# Patient Record
Sex: Female | Born: 1965
Health system: Southern US, Community
[De-identification: ages and names within clinical notes are randomized; demographics above are authoritative.]

## PROBLEM LIST (undated history)

## (undated) DIAGNOSIS — I10 Essential (primary) hypertension: Secondary | ICD-10-CM

## (undated) DIAGNOSIS — R161 Splenomegaly, not elsewhere classified: Secondary | ICD-10-CM

## (undated) DIAGNOSIS — E039 Hypothyroidism, unspecified: Secondary | ICD-10-CM

## (undated) DIAGNOSIS — R079 Chest pain, unspecified: Secondary | ICD-10-CM

## (undated) HISTORY — DX: Chest pain, unspecified: R07.9

## (undated) HISTORY — DX: Hypothyroidism, unspecified: E03.9

## (undated) HISTORY — DX: Essential (primary) hypertension: I10

## (undated) HISTORY — PX: LYMPH NODE DISSECTION: SHX5087

---
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
## 1997-06-15 ENCOUNTER — Inpatient Hospital Stay (HOSPITAL_COMMUNITY): Admission: AD | Admit: 1997-06-15 | Discharge: 1997-06-17 | Payer: Self-pay | Admitting: Gynecology

## 1998-11-05 ENCOUNTER — Other Ambulatory Visit: Admission: RE | Admit: 1998-11-05 | Discharge: 1998-11-05 | Payer: Self-pay | Admitting: Obstetrics and Gynecology

## 1999-06-03 ENCOUNTER — Ambulatory Visit (HOSPITAL_COMMUNITY): Admission: RE | Admit: 1999-06-03 | Discharge: 1999-06-03 | Payer: Self-pay | Admitting: *Deleted

## 1999-11-08 ENCOUNTER — Other Ambulatory Visit: Admission: RE | Admit: 1999-11-08 | Discharge: 1999-11-08 | Payer: Self-pay | Admitting: Gynecology

## 2000-11-16 ENCOUNTER — Other Ambulatory Visit: Admission: RE | Admit: 2000-11-16 | Discharge: 2000-11-16 | Payer: Self-pay | Admitting: Gynecology

## 2001-06-29 ENCOUNTER — Emergency Department (HOSPITAL_COMMUNITY): Admission: EM | Admit: 2001-06-29 | Discharge: 2001-06-29 | Payer: Self-pay | Admitting: Emergency Medicine

## 2001-11-04 ENCOUNTER — Encounter: Payer: Self-pay | Admitting: Internal Medicine

## 2001-11-04 ENCOUNTER — Ambulatory Visit (HOSPITAL_COMMUNITY): Admission: RE | Admit: 2001-11-04 | Discharge: 2001-11-04 | Payer: Self-pay | Admitting: Internal Medicine

## 2001-12-20 ENCOUNTER — Other Ambulatory Visit: Admission: RE | Admit: 2001-12-20 | Discharge: 2001-12-20 | Payer: Self-pay | Admitting: Gynecology

## 2002-12-26 ENCOUNTER — Other Ambulatory Visit: Admission: RE | Admit: 2002-12-26 | Discharge: 2002-12-26 | Payer: Self-pay | Admitting: Gynecology

## 2003-11-11 ENCOUNTER — Ambulatory Visit (HOSPITAL_COMMUNITY): Admission: RE | Admit: 2003-11-11 | Discharge: 2003-11-11 | Payer: Self-pay | Admitting: Internal Medicine

## 2004-02-09 ENCOUNTER — Other Ambulatory Visit: Admission: RE | Admit: 2004-02-09 | Discharge: 2004-02-09 | Payer: Self-pay | Admitting: Gynecology

## 2004-03-24 ENCOUNTER — Ambulatory Visit: Payer: Self-pay | Admitting: Oncology

## 2004-06-28 ENCOUNTER — Ambulatory Visit: Payer: Self-pay | Admitting: Oncology

## 2004-09-21 ENCOUNTER — Ambulatory Visit: Payer: Self-pay | Admitting: Oncology

## 2004-09-22 ENCOUNTER — Ambulatory Visit: Payer: Self-pay | Admitting: Oncology

## 2004-11-10 ENCOUNTER — Ambulatory Visit: Payer: Self-pay | Admitting: Oncology

## 2005-02-13 ENCOUNTER — Ambulatory Visit: Payer: Self-pay | Admitting: Oncology

## 2005-02-14 ENCOUNTER — Other Ambulatory Visit: Admission: RE | Admit: 2005-02-14 | Discharge: 2005-02-14 | Payer: Self-pay | Admitting: Gynecology

## 2005-08-14 ENCOUNTER — Ambulatory Visit: Payer: Self-pay | Admitting: Oncology

## 2005-11-27 ENCOUNTER — Ambulatory Visit (HOSPITAL_COMMUNITY): Admission: RE | Admit: 2005-11-27 | Discharge: 2005-11-27 | Payer: Self-pay | Admitting: Internal Medicine

## 2006-02-16 ENCOUNTER — Other Ambulatory Visit: Admission: RE | Admit: 2006-02-16 | Discharge: 2006-02-16 | Payer: Self-pay | Admitting: Gynecology

## 2006-02-20 ENCOUNTER — Ambulatory Visit (HOSPITAL_COMMUNITY): Admission: RE | Admit: 2006-02-20 | Discharge: 2006-02-20 | Payer: Self-pay | Admitting: Gynecology

## 2006-08-09 ENCOUNTER — Ambulatory Visit: Payer: Self-pay | Admitting: Oncology

## 2006-08-28 LAB — CBC WITH DIFFERENTIAL (CANCER CENTER ONLY)
BASO#: 0.1 10*3/uL (ref 0.0–0.2)
BASO%: 0.7 % (ref 0.0–2.0)
EOS%: 2.3 % (ref 0.0–7.0)
Eosinophils Absolute: 0.2 10*3/uL (ref 0.0–0.5)
HCT: 43.5 % (ref 34.8–46.6)
HGB: 15 g/dL (ref 11.6–15.9)
LYMPH#: 2.7 10*3/uL (ref 0.9–3.3)
LYMPH%: 30 % (ref 14.0–48.0)
MCH: 30.5 pg (ref 26.0–34.0)
MCHC: 34.4 g/dL (ref 32.0–36.0)
MCV: 89 fL (ref 81–101)
MONO#: 0.5 10*3/uL (ref 0.1–0.9)
MONO%: 6 % (ref 0.0–13.0)
NEUT#: 5.5 10*3/uL (ref 1.5–6.5)
NEUT%: 61 % (ref 39.6–80.0)
Platelets: 235 10*3/uL (ref 145–400)
RBC: 4.9 10*6/uL (ref 3.70–5.32)
RDW: 12.3 % (ref 10.5–14.6)
WBC: 9.1 10*3/uL (ref 3.9–10.0)

## 2006-08-29 LAB — IRON AND TIBC
%SAT: 22 % (ref 20–55)
Iron: 62 ug/dL (ref 42–145)
TIBC: 281 ug/dL (ref 250–470)
UIBC: 219 ug/dL

## 2006-08-29 LAB — COMPREHENSIVE METABOLIC PANEL
ALT: 20 U/L (ref 0–35)
AST: 19 U/L (ref 0–37)
Albumin: 4.3 g/dL (ref 3.5–5.2)
Alkaline Phosphatase: 53 U/L (ref 39–117)
BUN: 6 mg/dL (ref 6–23)
CO2: 26 mEq/L (ref 19–32)
Calcium: 9.4 mg/dL (ref 8.4–10.5)
Chloride: 103 mEq/L (ref 96–112)
Creatinine, Ser: 0.76 mg/dL (ref 0.40–1.20)
Glucose, Bld: 95 mg/dL (ref 70–99)
Potassium: 3.7 mEq/L (ref 3.5–5.3)
Sodium: 136 mEq/L (ref 135–145)
Total Bilirubin: 0.6 mg/dL (ref 0.3–1.2)
Total Protein: 7.6 g/dL (ref 6.0–8.3)

## 2006-08-29 LAB — RETICULOCYTES (CHCC)
ABS Retic: 65.3 10*3/uL (ref 19.0–186.0)
RBC.: 5.02 MIL/uL (ref 3.87–5.11)
Retic Ct Pct: 1.3 % (ref 0.4–3.1)

## 2006-08-29 LAB — FERRITIN: Ferritin: 131 ng/mL (ref 10–291)

## 2007-02-20 ENCOUNTER — Other Ambulatory Visit: Admission: RE | Admit: 2007-02-20 | Discharge: 2007-02-20 | Payer: Self-pay | Admitting: Gynecology

## 2007-02-22 ENCOUNTER — Ambulatory Visit: Payer: Self-pay | Admitting: Oncology

## 2007-02-26 LAB — CBC WITH DIFFERENTIAL (CANCER CENTER ONLY)
BASO#: 0.1 10*3/uL (ref 0.0–0.2)
BASO%: 0.6 % (ref 0.0–2.0)
EOS%: 1.9 % (ref 0.0–7.0)
Eosinophils Absolute: 0.2 10*3/uL (ref 0.0–0.5)
HCT: 39.8 % (ref 34.8–46.6)
HGB: 13.8 g/dL (ref 11.6–15.9)
LYMPH#: 2.4 10*3/uL (ref 0.9–3.3)
LYMPH%: 28.8 % (ref 14.0–48.0)
MCH: 30.2 pg (ref 26.0–34.0)
MCHC: 34.5 g/dL (ref 32.0–36.0)
MCV: 87 fL (ref 81–101)
MONO#: 0.6 10*3/uL (ref 0.1–0.9)
MONO%: 6.9 % (ref 0.0–13.0)
NEUT#: 5.2 10*3/uL (ref 1.5–6.5)
NEUT%: 61.8 % (ref 39.6–80.0)
Platelets: 270 10*3/uL (ref 145–400)
RBC: 4.56 10*6/uL (ref 3.70–5.32)
RDW: 12.3 % (ref 10.5–14.6)
WBC: 8.4 10*3/uL (ref 3.9–10.0)

## 2007-02-26 LAB — IRON AND TIBC
%SAT: 17 % — ABNORMAL LOW (ref 20–55)
Iron: 49 ug/dL (ref 42–145)
TIBC: 289 ug/dL (ref 250–470)
UIBC: 240 ug/dL

## 2007-02-26 LAB — FERRITIN: Ferritin: 81 ng/mL (ref 10–291)

## 2007-03-05 ENCOUNTER — Ambulatory Visit (HOSPITAL_COMMUNITY): Admission: RE | Admit: 2007-03-05 | Discharge: 2007-03-05 | Payer: Self-pay | Admitting: Gynecology

## 2007-08-20 ENCOUNTER — Ambulatory Visit: Payer: Self-pay | Admitting: Oncology

## 2007-08-26 LAB — CBC WITH DIFFERENTIAL (CANCER CENTER ONLY)
BASO#: 0.1 10*3/uL (ref 0.0–0.2)
BASO%: 0.8 % (ref 0.0–2.0)
EOS%: 1.9 % (ref 0.0–7.0)
Eosinophils Absolute: 0.2 10*3/uL (ref 0.0–0.5)
HCT: 40 % (ref 34.8–46.6)
HGB: 13.7 g/dL (ref 11.6–15.9)
LYMPH#: 2.6 10*3/uL (ref 0.9–3.3)
LYMPH%: 27.8 % (ref 14.0–48.0)
MCH: 28.9 pg (ref 26.0–34.0)
MCHC: 34.3 g/dL (ref 32.0–36.0)
MCV: 84 fL (ref 81–101)
MONO#: 0.5 10*3/uL (ref 0.1–0.9)
MONO%: 5.4 % (ref 0.0–13.0)
NEUT#: 6.1 10*3/uL (ref 1.5–6.5)
NEUT%: 64.1 % (ref 39.6–80.0)
Platelets: 254 10*3/uL (ref 145–400)
RBC: 4.75 10*6/uL (ref 3.70–5.32)
RDW: 12.3 % (ref 10.5–14.6)
WBC: 9.5 10*3/uL (ref 3.9–10.0)

## 2007-08-26 LAB — IRON AND TIBC
%SAT: 25 % (ref 20–55)
Iron: 73 ug/dL (ref 42–145)
TIBC: 292 ug/dL (ref 250–470)
UIBC: 219 ug/dL

## 2007-08-26 LAB — FERRITIN: Ferritin: 56 ng/mL (ref 10–291)

## 2008-02-19 ENCOUNTER — Ambulatory Visit: Payer: Self-pay | Admitting: Oncology

## 2008-02-21 ENCOUNTER — Ambulatory Visit: Payer: Self-pay | Admitting: Women's Health

## 2008-02-21 ENCOUNTER — Encounter: Payer: Self-pay | Admitting: Women's Health

## 2008-02-21 ENCOUNTER — Other Ambulatory Visit: Admission: RE | Admit: 2008-02-21 | Discharge: 2008-02-21 | Payer: Self-pay | Admitting: Gynecology

## 2008-02-24 LAB — CBC WITH DIFFERENTIAL (CANCER CENTER ONLY)
BASO#: 0.1 10*3/uL (ref 0.0–0.2)
BASO%: 1.3 % (ref 0.0–2.0)
EOS%: 2.7 % (ref 0.0–7.0)
Eosinophils Absolute: 0.2 10*3/uL (ref 0.0–0.5)
HCT: 40.9 % (ref 34.8–46.6)
HGB: 14.2 g/dL (ref 11.6–15.9)
LYMPH#: 3.1 10*3/uL (ref 0.9–3.3)
LYMPH%: 39.4 % (ref 14.0–48.0)
MCH: 29.8 pg (ref 26.0–34.0)
MCHC: 34.7 g/dL (ref 32.0–36.0)
MCV: 86 fL (ref 81–101)
MONO#: 0.5 10*3/uL (ref 0.1–0.9)
MONO%: 5.7 % (ref 0.0–13.0)
NEUT#: 4 10*3/uL (ref 1.5–6.5)
NEUT%: 50.9 % (ref 39.6–80.0)
Platelets: 247 10*3/uL (ref 145–400)
RBC: 4.76 10*6/uL (ref 3.70–5.32)
RDW: 12.7 % (ref 10.5–14.6)
WBC: 7.9 10*3/uL (ref 3.9–10.0)

## 2008-02-24 LAB — FERRITIN: Ferritin: 62 ng/mL (ref 10–291)

## 2008-02-24 LAB — IRON AND TIBC
%SAT: 28 % (ref 20–55)
Iron: 80 ug/dL (ref 42–145)
TIBC: 286 ug/dL (ref 250–470)
UIBC: 206 ug/dL

## 2008-03-06 ENCOUNTER — Ambulatory Visit (HOSPITAL_COMMUNITY): Admission: RE | Admit: 2008-03-06 | Discharge: 2008-03-06 | Payer: Self-pay | Admitting: Obstetrics and Gynecology

## 2008-03-06 ENCOUNTER — Ambulatory Visit (HOSPITAL_COMMUNITY): Admission: RE | Admit: 2008-03-06 | Discharge: 2008-03-06 | Payer: Self-pay | Admitting: Internal Medicine

## 2008-08-19 ENCOUNTER — Ambulatory Visit: Payer: Self-pay | Admitting: Oncology

## 2008-08-24 LAB — CBC WITH DIFFERENTIAL (CANCER CENTER ONLY)
BASO#: 0.1 10*3/uL (ref 0.0–0.2)
BASO%: 1.2 % (ref 0.0–2.0)
EOS%: 1.5 % (ref 0.0–7.0)
Eosinophils Absolute: 0.2 10*3/uL (ref 0.0–0.5)
HCT: 41.9 % (ref 34.8–46.6)
HGB: 14.3 g/dL (ref 11.6–15.9)
LYMPH#: 3.1 10*3/uL (ref 0.9–3.3)
LYMPH%: 30.9 % (ref 14.0–48.0)
MCH: 30.1 pg (ref 26.0–34.0)
MCHC: 34.3 g/dL (ref 32.0–36.0)
MCV: 88 fL (ref 81–101)
MONO#: 0.6 10*3/uL (ref 0.1–0.9)
MONO%: 6.4 % (ref 0.0–13.0)
NEUT#: 6 10*3/uL (ref 1.5–6.5)
NEUT%: 60 % (ref 39.6–80.0)
Platelets: 316 10*3/uL (ref 145–400)
RBC: 4.77 10*6/uL (ref 3.70–5.32)
RDW: 12.6 % (ref 10.5–14.6)
WBC: 10 10*3/uL (ref 3.9–10.0)

## 2008-08-24 LAB — IRON AND TIBC
%SAT: 22 % (ref 20–55)
Iron: 71 ug/dL (ref 42–145)
TIBC: 320 ug/dL (ref 250–470)
UIBC: 249 ug/dL

## 2008-08-24 LAB — FERRITIN: Ferritin: 34 ng/mL (ref 10–291)

## 2009-02-22 ENCOUNTER — Ambulatory Visit: Payer: Self-pay | Admitting: Women's Health

## 2009-02-22 ENCOUNTER — Other Ambulatory Visit: Admission: RE | Admit: 2009-02-22 | Discharge: 2009-02-22 | Payer: Self-pay | Admitting: Gynecology

## 2009-02-22 ENCOUNTER — Encounter: Payer: Self-pay | Admitting: Women's Health

## 2009-03-09 ENCOUNTER — Ambulatory Visit (HOSPITAL_COMMUNITY): Admission: RE | Admit: 2009-03-09 | Discharge: 2009-03-09 | Payer: Self-pay | Admitting: Obstetrics and Gynecology

## 2009-08-18 ENCOUNTER — Ambulatory Visit: Payer: Self-pay | Admitting: Oncology

## 2009-10-06 ENCOUNTER — Ambulatory Visit: Payer: Self-pay | Admitting: Oncology

## 2009-10-12 LAB — CBC WITH DIFFERENTIAL (CANCER CENTER ONLY)
BASO#: 0.1 10*3/uL (ref 0.0–0.2)
BASO%: 1 % (ref 0.0–2.0)
EOS%: 1.9 % (ref 0.0–7.0)
Eosinophils Absolute: 0.2 10*3/uL (ref 0.0–0.5)
HCT: 39.9 % (ref 34.8–46.6)
HGB: 13.4 g/dL (ref 11.6–15.9)
LYMPH#: 2.8 10*3/uL (ref 0.9–3.3)
LYMPH%: 30 % (ref 14.0–48.0)
MCH: 30.2 pg (ref 26.0–34.0)
MCHC: 33.6 g/dL (ref 32.0–36.0)
MCV: 90 fL (ref 81–101)
MONO#: 0.6 10*3/uL (ref 0.1–0.9)
MONO%: 6.9 % (ref 0.0–13.0)
NEUT#: 5.5 10*3/uL (ref 1.5–6.5)
NEUT%: 60.2 % (ref 39.6–80.0)
Platelets: 316 10*3/uL (ref 145–400)
RBC: 4.45 10*6/uL (ref 3.70–5.32)
RDW: 12.8 % (ref 10.5–14.6)
WBC: 9.2 10*3/uL (ref 3.9–10.0)

## 2009-10-12 LAB — IRON AND TIBC
%SAT: 20 % (ref 20–55)
Iron: 69 ug/dL (ref 42–145)
TIBC: 346 ug/dL (ref 250–470)
UIBC: 277 ug/dL

## 2009-10-12 LAB — FERRITIN: Ferritin: 22 ng/mL (ref 10–291)

## 2009-10-12 LAB — RETICULOCYTES (CHCC)
ABS Retic: 66.8 10*3/uL (ref 19.0–186.0)
RBC.: 4.45 MIL/uL (ref 3.87–5.11)
Retic Ct Pct: 1.5 % (ref 0.4–3.1)

## 2010-02-23 ENCOUNTER — Ambulatory Visit: Payer: Self-pay | Admitting: Women's Health

## 2010-02-23 ENCOUNTER — Other Ambulatory Visit: Admission: RE | Admit: 2010-02-23 | Discharge: 2010-02-23 | Payer: Self-pay | Admitting: Obstetrics and Gynecology

## 2010-03-22 ENCOUNTER — Ambulatory Visit (HOSPITAL_COMMUNITY): Admission: RE | Admit: 2010-03-22 | Discharge: 2010-03-22 | Payer: Self-pay | Admitting: Obstetrics and Gynecology

## 2011-02-14 ENCOUNTER — Other Ambulatory Visit: Payer: Self-pay | Admitting: Obstetrics and Gynecology

## 2011-02-14 DIAGNOSIS — Z1231 Encounter for screening mammogram for malignant neoplasm of breast: Secondary | ICD-10-CM

## 2011-02-17 ENCOUNTER — Encounter: Payer: Self-pay | Admitting: Anesthesiology

## 2011-02-27 ENCOUNTER — Encounter: Payer: Self-pay | Admitting: Women's Health

## 2011-03-15 ENCOUNTER — Ambulatory Visit (INDEPENDENT_AMBULATORY_CARE_PROVIDER_SITE_OTHER): Payer: 59 | Admitting: Women's Health

## 2011-03-15 ENCOUNTER — Other Ambulatory Visit (HOSPITAL_COMMUNITY)
Admission: RE | Admit: 2011-03-15 | Discharge: 2011-03-15 | Disposition: A | Discharge status: Home / Self Care | Payer: 59 | Source: Ambulatory Visit | Attending: Women's Health | Admitting: Women's Health

## 2011-03-15 ENCOUNTER — Encounter: Payer: Self-pay | Admitting: Women's Health

## 2011-03-15 VITALS — BP 130/80 | Ht 62.0 in | Wt 170.0 lb

## 2011-03-15 DIAGNOSIS — Z1159 Encounter for screening for other viral diseases: Secondary | ICD-10-CM | POA: Insufficient documentation

## 2011-03-15 DIAGNOSIS — E785 Hyperlipidemia, unspecified: Secondary | ICD-10-CM

## 2011-03-15 DIAGNOSIS — I10 Essential (primary) hypertension: Secondary | ICD-10-CM

## 2011-03-15 DIAGNOSIS — E1169 Type 2 diabetes mellitus with other specified complication: Secondary | ICD-10-CM | POA: Insufficient documentation

## 2011-03-15 DIAGNOSIS — Z01419 Encounter for gynecological examination (general) (routine) without abnormal findings: Secondary | ICD-10-CM

## 2011-03-15 DIAGNOSIS — E039 Hypothyroidism, unspecified: Secondary | ICD-10-CM

## 2011-03-15 NOTE — Progress Notes (Signed)
Allison Contreras 07-05-1965 [id number]    History:    The patient presents for annual exam.  Works [employer], husband an Pensions consultant. Daughters Maralyn Sago, 61 and doing well reviewed Gardasil. Son is 45 years old and working   Past medical history, past surgical history, family history and social history were all reviewed and documented in the EPIC chart.   ROS:  A  ROS was performed and pertinent positives and negatives are included in the history.  Exam:  Filed Vitals:   03/15/11 1433  BP: 130/80    General appearance:  Normal Head/Neck:  Normal, without cervical or supraclavicular adenopathy. Thyroid:  Symmetrical, normal in size, without palpable masses or nodularity. Respiratory  Effort:  Normal  Auscultation:  Clear without wheezing or rhonchi Cardiovascular  Auscultation:  Regular rate, without rubs, murmurs or gallops  Edema/varicosities:  Not grossly evident Abdominal  Soft,nontender, without masses, guarding or rebound.  Liver/spleen:  No organomegaly noted  Hernia:  None appreciated  Skin  Inspection:  Grossly normal  Palpation:  Grossly normal Neurologic/psychiatric  Orientation:  Normal with appropriate conversation.  Mood/affect:  Normal  Genitourinary    Breasts: Examined lying and sitting.     Right: Without masses, retractions, discharge or axillary adenopathy.     Left: Without masses, retractions, discharge or axillary adenopathy.   Inguinal/mons:  Normal without inguinal adenopathy  External genitalia:  Normal  BUS/Urethra/Skene's glands:  Normal  Bladder:  Normal  Vagina:  Normal  Cervix:  Normal  Uterus:  Retroverted, normal in size, shape and contour.  Midline and mobile  Adnexa/parametria:     Rt: Without masses or tenderness.   Lt: Without masses or tenderness.  Anus and perineum: Normal  Digital rectal exam: Normal sphincter tone without palpated masses or tenderness  Assessment/Plan:  45 y.o.MWF G2P2  for annual exam without complaints. Five-day  monthly cycle/withdrawal.  Normal GYN exam Hypertension/hypothyroidism/hyperlipidemia-labs and meds primary care  Plan: Contraception options were reviewed and declined, states withdrawal fine, pregnancy okay if it would occur. SBEs, annual mammogram which is due next month will schedule. Encouraged increased exercise, decreasing calories for weight loss. She's gained about 30 pounds in the last 10 years. Pap only today.    Harrington Challenger [facility], 3:14 PM 03/15/2011

## 2011-03-28 ENCOUNTER — Ambulatory Visit (HOSPITAL_COMMUNITY)
Admission: RE | Admit: 2011-03-28 | Discharge: 2011-03-28 | Disposition: A | Discharge status: Home / Self Care | Payer: 59 | Source: Ambulatory Visit | Attending: Obstetrics and Gynecology | Admitting: Obstetrics and Gynecology

## 2011-03-28 DIAGNOSIS — Z1231 Encounter for screening mammogram for malignant neoplasm of breast: Secondary | ICD-10-CM | POA: Insufficient documentation

## 2011-05-29 ENCOUNTER — Encounter: Payer: Self-pay | Admitting: Gynecology

## 2011-05-29 ENCOUNTER — Ambulatory Visit (INDEPENDENT_AMBULATORY_CARE_PROVIDER_SITE_OTHER): Payer: 59 | Admitting: Gynecology

## 2011-05-29 DIAGNOSIS — R300 Dysuria: Secondary | ICD-10-CM

## 2011-05-29 DIAGNOSIS — R109 Unspecified abdominal pain: Secondary | ICD-10-CM

## 2011-05-29 DIAGNOSIS — R350 Frequency of micturition: Secondary | ICD-10-CM

## 2011-05-29 LAB — URINALYSIS W MICROSCOPIC + REFLEX CULTURE
Bilirubin Urine: NEGATIVE
Glucose, UA: NEGATIVE mg/dL
Hgb urine dipstick: NEGATIVE
Ketones, ur: NEGATIVE mg/dL
Leukocytes, UA: NEGATIVE
Nitrite: NEGATIVE
Protein, ur: NEGATIVE mg/dL
Specific Gravity, Urine: 1.005 (ref 1.005–1.030)
Urobilinogen, UA: 0.2 mg/dL (ref 0.0–1.0)
pH: 5.5 (ref 5.0–8.0)

## 2011-05-29 MED ORDER — SULFAMETHOXAZOLE-TRIMETHOPRIM 800-160 MG PO TABS: 1.0000 | ORAL_TABLET | Freq: Two times a day (BID) | ORAL | Status: AC

## 2011-05-29 NOTE — Progress Notes (Signed)
Patient presents with 3 day history of lower abdominal discomfort, some urinary frequency and dysuria. No fevers chills nausea vomiting or other constitutional symptoms. Actually feels a little bit better today. Has been drinking lots of fluid.  Exam with Sherri chaperone present Spine: Straight without deformity no CVA tenderness Abdomen: Soft mild suprapubic tenderness no masses guarding rebound organomegaly Pelvic: Bimanual uterus normal size midline mobile nontender adnexa without masses or tenderness  Assessment and plan: Urinalysis is negative. Her symptoms are very suspicious for UTI. I suspect she has diluted her urine which factitiously makes her UA looked normal. The cover her with Septra DS one by mouth twice a day x3 days follow up if symptoms persist or recur. I am check a baseline culture.

## 2011-05-29 NOTE — Patient Instructions (Signed)
Take antibiotics as prescribed. Follow up if your symptoms persist or recur.

## 2011-05-31 LAB — URINE CULTURE
Colony Count: NO GROWTH
Organism ID, Bacteria: NO GROWTH

## 2012-02-20 ENCOUNTER — Other Ambulatory Visit: Payer: Self-pay | Admitting: Women's Health

## 2012-02-20 DIAGNOSIS — Z1231 Encounter for screening mammogram for malignant neoplasm of breast: Secondary | ICD-10-CM

## 2012-03-15 ENCOUNTER — Encounter: Payer: Self-pay | Admitting: Women's Health

## 2012-03-15 ENCOUNTER — Ambulatory Visit (INDEPENDENT_AMBULATORY_CARE_PROVIDER_SITE_OTHER): Payer: BC Managed Care – PPO | Admitting: Women's Health

## 2012-03-15 VITALS — BP 146/88 | Ht 61.5 in | Wt 166.0 lb

## 2012-03-15 DIAGNOSIS — Z01419 Encounter for gynecological examination (general) (routine) without abnormal findings: Secondary | ICD-10-CM

## 2012-03-15 DIAGNOSIS — N926 Irregular menstruation, unspecified: Secondary | ICD-10-CM | POA: Insufficient documentation

## 2012-03-15 NOTE — Patient Instructions (Signed)

## 2012-03-15 NOTE — Progress Notes (Signed)
Allison Contreras 1965-12-30 [id number]    History:    The patient presents for annual exam.  Cycles every 1-2-1/2 months, last cycle September 1,  has occasional menopausal symptoms. Cycles have gotten increasingly irregular over past year. Has had 2 negative U PTs at home. Withdrawal for contraception for years. History of normal Paps and mammograms. Primary care manages hypothyroidism, hypertension and hypercholesterolemia.   Past medical history, past surgical history, family history and social history were all reviewed and documented in the EPIC chart. 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 Allison Contreras 24 and planning marriage. History of a benign neck lymph node. Mother history of non-Hodgkin's lymphoma and diabetes. Father history of hypertension and heart disease.   ROS:  A  ROS was performed and pertinent positives and negatives are included in the history.  Exam:  Filed Vitals:   03/15/12 0922  BP: 146/88    General appearance:  Normal Head/Neck:  Normal, without cervical or supraclavicular adenopathy. Thyroid:  Symmetrical, normal in size, without palpable masses or nodularity. Respiratory  Effort:  Normal  Auscultation:  Clear without wheezing or rhonchi Cardiovascular  Auscultation:  Regular rate, without rubs, murmurs or gallops  Edema/varicosities:  Not grossly evident Abdominal  Soft,nontender, without masses, guarding or rebound.  Liver/spleen:  No organomegaly noted  Hernia:  None appreciated  Skin  Inspection:  Grossly normal  Palpation:  Grossly normal Neurologic/psychiatric  Orientation:  Normal with appropriate conversation.  Mood/affect:  Normal  Genitourinary    Breasts: Examined lying and sitting.     Right: Without masses, retractions, discharge or axillary adenopathy.     Left: Without masses, retractions, discharge or axillary adenopathy.   Inguinal/mons:  Normal without inguinal adenopathy  External genitalia:   Normal  BUS/Urethra/Skene's glands:  Normal  Bladder:  Normal  Vagina:  Normal  Cervix:  Normal  Uterus:   normal in size, shape and contour.  Midline and mobile  Adnexa/parametria:     Rt: Without masses or tenderness.   Lt: Without masses or tenderness.  Anus and perineum: Normal  Digital rectal exam: Normal sphincter tone without palpated masses or tenderness  Assessment/Plan:  46 y.o. M WF G2 P2  for annual exam with no complaints.  Irregular cycles/withdrawal Hypertension/hypercholesterolemia/hypothyroidism-primary care labs and meds  Plan: SBE's, continue annual mammogram, calcium rich diet, vitamin D 1000 daily encouraged. Reviewed importance of decreasing calories and increasing exercise for weight loss and health. Normal Pap 2012, new screening guidelines reviewed. Declines medication to make cycle start today states if no cycle by end of November will return to office. Reviewed importance of cycles at least every 3 months.    Harrington Challenger WHNP, 1:56 PM 03/15/2012

## 2012-03-16 LAB — URINALYSIS W MICROSCOPIC + REFLEX CULTURE
Bacteria, UA: NONE SEEN
Bilirubin Urine: NEGATIVE
Casts: NONE SEEN
Crystals: NONE SEEN
Glucose, UA: NEGATIVE mg/dL
Hgb urine dipstick: NEGATIVE
Ketones, ur: NEGATIVE mg/dL
Leukocytes, UA: NEGATIVE
Nitrite: NEGATIVE
Protein, ur: NEGATIVE mg/dL
Specific Gravity, Urine: <1.005 — ABNORMAL LOW (ref 1.005–1.030)
Squamous Epithelial / LPF: NONE SEEN
Urobilinogen, UA: 0.2 mg/dL (ref 0.0–1.0)
pH: 5.5 (ref 5.0–8.0)

## 2012-03-28 ENCOUNTER — Ambulatory Visit (HOSPITAL_COMMUNITY)
Admission: RE | Admit: 2012-03-28 | Discharge: 2012-03-28 | Disposition: A | Discharge status: Home / Self Care | Payer: BC Managed Care – PPO | Source: Ambulatory Visit | Attending: Women's Health | Admitting: Women's Health

## 2012-03-28 DIAGNOSIS — Z1231 Encounter for screening mammogram for malignant neoplasm of breast: Secondary | ICD-10-CM | POA: Insufficient documentation

## 2012-11-29 ENCOUNTER — Other Ambulatory Visit: Payer: Self-pay | Admitting: Dermatology

## 2013-02-06 ENCOUNTER — Ambulatory Visit: Payer: Self-pay | Admitting: Women's Health

## 2013-02-07 ENCOUNTER — Ambulatory Visit: Payer: Self-pay | Admitting: Gynecology

## 2013-03-11 ENCOUNTER — Other Ambulatory Visit: Payer: Self-pay | Admitting: Women's Health

## 2013-03-11 DIAGNOSIS — Z1231 Encounter for screening mammogram for malignant neoplasm of breast: Secondary | ICD-10-CM

## 2013-03-18 ENCOUNTER — Ambulatory Visit (INDEPENDENT_AMBULATORY_CARE_PROVIDER_SITE_OTHER): Payer: BC Managed Care – PPO | Admitting: Women's Health

## 2013-03-18 ENCOUNTER — Other Ambulatory Visit (HOSPITAL_COMMUNITY)
Admission: RE | Admit: 2013-03-18 | Discharge: 2013-03-18 | Disposition: A | Discharge status: Home / Self Care | Payer: BC Managed Care – PPO | Source: Ambulatory Visit | Attending: Gynecology | Admitting: Gynecology

## 2013-03-18 ENCOUNTER — Encounter: Payer: Self-pay | Admitting: Women's Health

## 2013-03-18 VITALS — BP 118/74 | Ht 62.0 in | Wt 162.2 lb

## 2013-03-18 DIAGNOSIS — Z01419 Encounter for gynecological examination (general) (routine) without abnormal findings: Secondary | ICD-10-CM

## 2013-03-18 NOTE — Patient Instructions (Signed)

## 2013-03-18 NOTE — Progress Notes (Signed)
Allison Contreras 1965-06-06 [id number]    History:    The patient presents for annual exam.  Cycles every 1-2 months/ withdrawal. Cycles always irregular. Hypothyroid/hypercholesteremia/hypertension-primary care manages labs and meds. Normal Pap and mammogram history.   Past medical history, past surgical history, family history and social history were all reviewed and documented in the EPIC chart. 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  Allison Contreras 14, Allison Contreras 47 both doing well. Mother diabetes, died from lymphoma, father hypertension died of heart disease.   ROS:  A  ROS was performed and pertinent positives and negatives are included in the history.  Exam:  Filed Vitals:   03/18/13 0908  BP: 118/74    General appearance:  Normal Head/Neck:  Normal, without cervical or supraclavicular adenopathy. Thyroid:  Symmetrical, normal in size, without palpable masses or nodularity. Respiratory  Effort:  Normal  Auscultation:  Clear without wheezing or rhonchi Cardiovascular  Auscultation:  Regular rate, without rubs, murmurs or gallops  Edema/varicosities:  Not grossly evident Abdominal  Soft,nontender, without masses, guarding or rebound.  Liver/spleen:  No organomegaly noted  Hernia:  None appreciated  Skin  Inspection:  Grossly normal  Palpation:  Grossly normal Neurologic/psychiatric  Orientation:  Normal with appropriate conversation.  Mood/affect:  Normal  Genitourinary    Breasts: Examined lying and sitting.     Right: Without masses, retractions, discharge or axillary adenopathy.     Left: Without masses, retractions, discharge or axillary adenopathy.   Inguinal/mons:  Normal without inguinal adenopathy  External genitalia:  Normal  BUS/Urethra/Skene's glands:  Normal  Bladder:  Normal  Vagina:  Normal  Cervix:  Normal  Uterus:   normal in size, shape and contour.  Midline and mobile  Adnexa/parametria:     Rt: Without masses or tenderness.   Lt: Without  masses or tenderness.  Anus and perineum: Normal  Digital rectal exam: Normal sphincter tone without palpated masses or tenderness  Assessment/Plan:  47 y.o. MWF G2P2 for annual exam with no complaints.  Cycles every 1-2 months/withdrawal Hypothyroid/hypercholesteremia/hypertension-primary care labs and meds  Plan: SBE's, continue annual mammogram, calcium rich diet, vitamin D 1000 daily. Reviewed importance of regular exercise. Pap, Pap ascus with negative HR HPV 2012. New screening guidelines reviewed. Contraception options reviewed and declined. Instructed to call if cycles space greater than 60 days.    Harrington Challenger [facility], 11:51 AM 03/18/2013

## 2013-03-31 ENCOUNTER — Ambulatory Visit (HOSPITAL_COMMUNITY)
Admission: RE | Admit: 2013-03-31 | Discharge: 2013-03-31 | Disposition: A | Discharge status: Home / Self Care | Payer: BC Managed Care – PPO | Source: Ambulatory Visit | Attending: Women's Health | Admitting: Women's Health

## 2013-03-31 DIAGNOSIS — Z1231 Encounter for screening mammogram for malignant neoplasm of breast: Secondary | ICD-10-CM | POA: Insufficient documentation

## 2013-06-08 ENCOUNTER — Encounter: Payer: Self-pay | Admitting: *Deleted

## 2013-06-09 ENCOUNTER — Ambulatory Visit: Payer: Self-pay | Admitting: Emergency Medicine

## 2013-06-11 ENCOUNTER — Ambulatory Visit (INDEPENDENT_AMBULATORY_CARE_PROVIDER_SITE_OTHER): Payer: BC Managed Care – PPO | Admitting: Emergency Medicine

## 2013-06-11 ENCOUNTER — Encounter: Payer: Self-pay | Admitting: Emergency Medicine

## 2013-06-11 VITALS — BP 158/94 | HR 68 | Temp 97.8°F | Resp 18 | Ht 62.0 in | Wt 163.0 lb

## 2013-06-11 DIAGNOSIS — E782 Mixed hyperlipidemia: Secondary | ICD-10-CM

## 2013-06-11 DIAGNOSIS — R7309 Other abnormal glucose: Secondary | ICD-10-CM

## 2013-06-11 DIAGNOSIS — D649 Anemia, unspecified: Secondary | ICD-10-CM

## 2013-06-11 DIAGNOSIS — I10 Essential (primary) hypertension: Secondary | ICD-10-CM

## 2013-06-11 DIAGNOSIS — R5381 Other malaise: Secondary | ICD-10-CM

## 2013-06-11 DIAGNOSIS — R5383 Other fatigue: Secondary | ICD-10-CM

## 2013-06-11 LAB — CBC WITH DIFFERENTIAL/PLATELET
Basophils Absolute: 0 10*3/uL (ref 0.0–0.1)
Basophils Relative: 0 % (ref 0–1)
Eosinophils Absolute: 0.1 10*3/uL (ref 0.0–0.7)
Eosinophils Relative: 2 % (ref 0–5)
HCT: 40.4 % (ref 36.0–46.0)
Hemoglobin: 13.5 g/dL (ref 12.0–15.0)
Lymphocytes Relative: 30 % (ref 12–46)
Lymphs Abs: 2.3 10*3/uL (ref 0.7–4.0)
MCH: 27.8 pg (ref 26.0–34.0)
MCHC: 33.4 g/dL (ref 30.0–36.0)
MCV: 83.1 fL (ref 78.0–100.0)
Monocytes Absolute: 0.6 10*3/uL (ref 0.1–1.0)
Monocytes Relative: 8 % (ref 3–12)
Neutro Abs: 4.6 10*3/uL (ref 1.7–7.7)
Neutrophils Relative %: 60 % (ref 43–77)
Platelets: 257 10*3/uL (ref 150–400)
RBC: 4.86 MIL/uL (ref 3.87–5.11)
RDW: 14.8 % (ref 11.5–15.5)
WBC: 7.6 10*3/uL (ref 4.0–10.5)

## 2013-06-11 LAB — BASIC METABOLIC PANEL WITH GFR
BUN: 13 mg/dL (ref 6–23)
CO2: 28 mEq/L (ref 19–32)
Calcium: 9.7 mg/dL (ref 8.4–10.5)
Chloride: 101 mEq/L (ref 96–112)
Creat: 0.88 mg/dL (ref 0.50–1.10)
GFR, Est African American: >89 mL/min
GFR, Est Non African American: 78 mL/min
Glucose, Bld: 109 mg/dL — ABNORMAL HIGH (ref 70–99)
Potassium: 4.4 mEq/L (ref 3.5–5.3)
Sodium: 135 mEq/L (ref 135–145)

## 2013-06-11 LAB — LIPID PANEL
Cholesterol: 169 mg/dL (ref 0–200)
HDL: 30 mg/dL — ABNORMAL LOW (ref >39)
Total CHOL/HDL Ratio: 5.6 Ratio
Triglycerides: 417 mg/dL — ABNORMAL HIGH (ref <150)

## 2013-06-11 LAB — HEPATIC FUNCTION PANEL
ALT: 23 U/L (ref 0–35)
AST: 18 U/L (ref 0–37)
Albumin: 4.6 g/dL (ref 3.5–5.2)
Alkaline Phosphatase: 51 U/L (ref 39–117)
Bilirubin, Direct: 0.1 mg/dL (ref 0.0–0.3)
Indirect Bilirubin: 0.3 mg/dL (ref 0.2–1.2)
Total Bilirubin: 0.4 mg/dL (ref 0.2–1.2)
Total Protein: 7.9 g/dL (ref 6.0–8.3)

## 2013-06-11 LAB — IRON AND TIBC
%SAT: 14 % — ABNORMAL LOW (ref 20–55)
Iron: 55 ug/dL (ref 42–145)
TIBC: 391 ug/dL (ref 250–470)
UIBC: 336 ug/dL (ref 125–400)

## 2013-06-11 LAB — HEMOGLOBIN A1C
Hgb A1c MFr Bld: 5.7 % — ABNORMAL HIGH (ref <5.7)
Mean Plasma Glucose: 117 mg/dL — ABNORMAL HIGH (ref <117)

## 2013-06-11 MED ORDER — FENOFIBRATE MICRONIZED 134 MG PO CAPS
134.0000 mg | ORAL_CAPSULE | Freq: Every day | ORAL | Status: DC
Start: 2013-06-11 — End: 2014-12-18

## 2013-06-11 NOTE — Patient Instructions (Signed)
Fat and Cholesterol Control Diet  Fat and cholesterol levels in your blood and organs are influenced by your diet. High levels of fat and cholesterol may lead to diseases of the heart, small and large blood vessels, gallbladder, liver, and pancreas.  CONTROLLING FAT AND CHOLESTEROL WITH DIET  Although exercise and lifestyle factors are important, your diet is key. That is because certain foods are known to raise cholesterol and others to lower it. The goal is to balance foods for their effect on cholesterol and more importantly, to replace saturated and trans fat with other types of fat, such as monounsaturated fat, polyunsaturated fat, and omega-3 fatty acids.  On average, a person should consume no more than 15 to 17 g of saturated fat daily. Saturated and trans fats are considered "bad" fats, and they will raise LDL cholesterol. Saturated fats are primarily found in animal products such as meats, butter, and cream. However, that does not mean you need to give up all your favorite foods. Today, there are good tasting, low-fat, low-cholesterol substitutes for most of the things you like to eat. Choose low-fat or nonfat alternatives. Choose round or loin cuts of red meat. These types of cuts are lowest in fat and cholesterol. Chicken (without the skin), fish, veal, and ground turkey breast are great choices. Eliminate fatty meats, such as hot dogs and salami. Even shellfish have little or no saturated fat. Have a 3 oz (85 g) portion when you eat lean meat, poultry, or fish.  Trans fats are also called "partially hydrogenated oils." They are oils that have been scientifically manipulated so that they are solid at room temperature resulting in a longer shelf life and improved taste and texture of foods in which they are added. Trans fats are found in stick margarine, some tub margarines, cookies, crackers, and baked goods.   When baking and cooking, oils are a great substitute for butter. The monounsaturated oils are  especially beneficial since it is believed they lower LDL and raise HDL. The oils you should avoid entirely are saturated tropical oils, such as coconut and palm.   Remember to eat a lot from food groups that are naturally free of saturated and trans fat, including fish, fruit, vegetables, beans, grains (barley, rice, couscous, bulgur wheat), and pasta (without cream sauces).   IDENTIFYING FOODS THAT LOWER FAT AND CHOLESTEROL   Soluble fiber may lower your cholesterol. This type of fiber is found in fruits such as apples, vegetables such as broccoli, potatoes, and carrots, legumes such as beans, peas, and lentils, and grains such as barley. Foods fortified with plant sterols (phytosterol) may also lower cholesterol. You should eat at least 2 g per day of these foods for a cholesterol lowering effect.   Read package labels to identify low-saturated fats, trans fat free, and low-fat foods at the supermarket. Select cheeses that have only 2 to 3 g saturated fat per ounce. Use a heart-healthy tub margarine that is free of trans fats or partially hydrogenated oil. When buying baked goods (cookies, crackers), avoid partially hydrogenated oils. Breads and muffins should be made from whole grains (whole-wheat or whole oat flour, instead of "flour" or "enriched flour"). Buy non-creamy canned soups with reduced salt and no added fats.   FOOD PREPARATION TECHNIQUES   Never deep-fry. If you must fry, either stir-fry, which uses very little fat, or use non-stick cooking sprays. When possible, broil, bake, or roast meats, and steam vegetables. Instead of putting butter or margarine on vegetables, use lemon   and herbs, applesauce, and cinnamon (for squash and sweet potatoes). Use nonfat yogurt, salsa, and low-fat dressings for salads.   LOW-SATURATED FAT / LOW-FAT FOOD SUBSTITUTES  Meats / Saturated Fat (g)  · Avoid: Steak, marbled (3 oz/85 g) / 11 g  · Choose: Steak, lean (3 oz/85 g) / 4 g  · Avoid: Hamburger (3 oz/85 g) / 7  g  · Choose: Hamburger, lean (3 oz/85 g) / 5 g  · Avoid: Ham (3 oz/85 g) / 6 g  · Choose: Ham, lean cut (3 oz/85 g) / 2.4 g  · Avoid: Chicken, with skin, dark meat (3 oz/85 g) / 4 g  · Choose: Chicken, skin removed, dark meat (3 oz/85 g) / 2 g  · Avoid: Chicken, with skin, light meat (3 oz/85 g) / 2.5 g  · Choose: Chicken, skin removed, light meat (3 oz/85 g) / 1 g  Dairy / Saturated Fat (g)  · Avoid: Whole milk (1 cup) / 5 g  · Choose: Low-fat milk, 2% (1 cup) / 3 g  · Choose: Low-fat milk, 1% (1 cup) / 1.5 g  · Choose: Skim milk (1 cup) / 0.3 g  · Avoid: Hard cheese (1 oz/28 g) / 6 g  · Choose: Skim milk cheese (1 oz/28 g) / 2 to 3 g  · Avoid: Cottage cheese, 4% fat (1 cup) / 6.5 g  · Choose: Low-fat cottage cheese, 1% fat (1 cup) / 1.5 g  · Avoid: Ice cream (1 cup) / 9 g  · Choose: Sherbet (1 cup) / 2.5 g  · Choose: Nonfat frozen yogurt (1 cup) / 0.3 g  · Choose: Frozen fruit bar / trace  · Avoid: Whipped cream (1 tbs) / 3.5 g  · Choose: Nondairy whipped topping (1 tbs) / 1 g  Condiments / Saturated Fat (g)  · Avoid: Mayonnaise (1 tbs) / 2 g  · Choose: Low-fat mayonnaise (1 tbs) / 1 g  · Avoid: Butter (1 tbs) / 7 g  · Choose: Extra light margarine (1 tbs) / 1 g  · Avoid: Coconut oil (1 tbs) / 11.8 g  · Choose: Olive oil (1 tbs) / 1.8 g  · Choose: Corn oil (1 tbs) / 1.7 g  · Choose: Safflower oil (1 tbs) / 1.2 g  · Choose: Sunflower oil (1 tbs) / 1.4 g  · Choose: Soybean oil (1 tbs) / 2.4 g  · Choose: Canola oil (1 tbs) / 1 g  Document Released: 05/01/2005 Document Revised: 08/26/2012 Document Reviewed: 10/20/2010  Exit[employer]® Patient Information ©2014 [employer].

## 2013-06-11 NOTE — Progress Notes (Signed)
   Subjective:    Patient ID: Allison BonesSharon Fees, female    DOB: 01/18/1966, 48 y.o.   MRN: [id number]  HPI Comments: 48 yo female presents for 3 month F/U for HTN, Cholesterol, Pre-Dm, D. deficient LAST LABS T 153 TG 354 H 29 L 53 A1C 5.7 MAG 2.1 D 23 She did not start vitamins AD. She is trying to increase exercise. She is eating descent. BP has range of 120-140/ 80s depending on diet. She had chilli beans last pm and thinks that is reason for elevated BP today. She has been taking Fenofibrate sporatically.   She has noticed mild fatigue on and off, but still did not start vitamins AD. She notes rest helps with fatigue.  Hypertension  Hyperlipidemia   Current Outpatient Prescriptions on File Prior to Visit  Medication Sig Dispense Refill  . bisoprolol-hydrochlorothiazide (ZIAC) 10-6.25 MG per tablet Take 1 tablet by mouth daily.        Marland Kitchen. levothyroxine (SYNTHROID, LEVOTHROID) 112 MCG tablet Take 112 mcg by mouth daily before breakfast.       No current facility-administered medications on file prior to visit.   ALLERGIES Codeine and Ppd  Past Medical History  Diagnosis Date  . Hypothyroidism   . Hypertension   . High cholesterol       Review of Systems  Constitutional: Positive for fatigue.  All other systems reviewed and are negative.   BP 158/94  Pulse 68  Temp(Src) 97.8 F (36.6 C) (Temporal)  Resp 18  Ht 5\' 2"  (1.575 m)  Wt 163 lb (73.936 kg)  BMI 29.81 kg/m2     Objective:   Physical Exam  Nursing note and vitals reviewed. Constitutional: She is oriented to person, place, and time. She appears well-developed and well-nourished. No distress.  HENT:  Head: Normocephalic and atraumatic.  Right Ear: External ear normal.  Left Ear: External ear normal.  Nose: Nose normal.  Mouth/Throat: Oropharynx is clear and moist.  Eyes: Conjunctivae and EOM are normal.  Neck: Normal range of motion. Neck supple. No JVD present. No thyromegaly present.  Cardiovascular: Normal  rate, regular rhythm, normal heart sounds and intact distal pulses.   Pulmonary/Chest: Effort normal and breath sounds normal.  Abdominal: Soft. Bowel sounds are normal. She exhibits no distension and no mass. There is no tenderness. There is no rebound and no guarding.  Musculoskeletal: Normal range of motion. She exhibits no edema and no tenderness.  Lymphadenopathy:    She has no cervical adenopathy.  Neurological: She is alert and oriented to person, place, and time. No cranial nerve deficit.  Skin: Skin is warm and dry. No rash noted. No erythema. No pallor.  Psychiatric: She has a normal mood and affect. Her behavior is normal. Judgment and thought content normal.          Assessment & Plan:  1.  3 month F/U for HTN, Cholesterol, Pre-Dm, D. Deficient. Needs healthy diet, cardio QD and obtain healthy weight. Check Labs, Check BP if >130/80 call office 2. Anemia HX/ Fatigue- check labs, increase activity and H2O

## 2013-06-12 LAB — INSULIN, FASTING: Insulin fasting, serum: 47 u[IU]/mL — ABNORMAL HIGH (ref 3–28)

## 2013-07-28 ENCOUNTER — Encounter: Payer: Self-pay | Admitting: Physician Assistant

## 2013-07-28 ENCOUNTER — Ambulatory Visit (INDEPENDENT_AMBULATORY_CARE_PROVIDER_SITE_OTHER): Payer: BC Managed Care – PPO | Admitting: Physician Assistant

## 2013-07-28 VITALS — BP 142/82 | HR 68 | Temp 98.2°F | Resp 16 | Wt 165.0 lb

## 2013-07-28 DIAGNOSIS — M25562 Pain in left knee: Secondary | ICD-10-CM

## 2013-07-28 DIAGNOSIS — M25569 Pain in unspecified knee: Secondary | ICD-10-CM

## 2013-07-28 NOTE — Progress Notes (Signed)
   Subjective:    Patient ID: Allison BonesSharon Reddin, female    DOB: 04/22/1966, 48 y.o.   MRN: [id number]  Knee Pain  Incident onset: 1 week ago. Incident location: unknown, got on treadmill once. There was no injury mechanism. The pain is present in the left knee. The quality of the pain is described as aching. The pain is mild. The pain has been intermittent since onset. Associated symptoms include a loss of motion. Pertinent negatives include no inability to bear weight, loss of sensation, muscle weakness, numbness or tingling. She reports no foreign bodies present. The symptoms are aggravated by movement and palpation (worse with steps, occasionally feels like knee gives out, no popping, clicking,locking. ). She has tried heat and acetaminophen for the symptoms. The treatment provided mild relief.    Review of Systems  Constitutional: Negative.   HENT: Negative.   Respiratory: Negative.   Cardiovascular: Negative.   Gastrointestinal: Negative.   Genitourinary: Negative.   Musculoskeletal: Positive for arthralgias and gait problem. Negative for back pain, joint swelling, myalgias, neck pain and neck stiffness.  Skin: Negative.   Neurological: Negative.  Negative for tingling and numbness.       Objective:   Physical Exam  Constitutional: She is oriented to person, place, and time. She appears well-developed and well-nourished.  HENT:  Head: Normocephalic and atraumatic.  Right Ear: External ear normal.  Left Ear: External ear normal.  Mouth/Throat: Oropharynx is clear and moist.  Eyes: Conjunctivae and EOM are normal. Pupils are equal, round, and reactive to light.  Neck: Normal range of motion. Neck supple. No thyromegaly present.  Cardiovascular: Normal rate, regular rhythm and normal heart sounds.  Exam reveals no gallop and no friction rub.   No murmur heard. Pulmonary/Chest: Effort normal and breath sounds normal. No respiratory distress. She has no wheezes.  Abdominal: Soft. Bowel  sounds are normal. She exhibits no distension and no mass. There is no tenderness. There is no rebound and no guarding.  Musculoskeletal: Normal range of motion.  Left knee without effusion, erythema, warmth. No laxity, no mensicus tenderness, no joint line tenderness, + crepitus with patellar tendon. Good pulses, sensation distal. No edema distal. Possible arserine bursa tenderness.   Lymphadenopathy:    She has no cervical adenopathy.  Neurological: She is alert and oriented to person, place, and time. She displays normal reflexes. No cranial nerve deficit. Coordination normal.  Skin: Skin is warm and dry.  Psychiatric: She has a normal mood and affect.      Assessment & Plan:  Left knee pain- patellofemoral vs Chondromalacia RICE, NSAIDS, exercises given, if not better get xray and PT referral or ortho referral.

## 2013-07-28 NOTE — Patient Instructions (Signed)
Patellar Tendinitis,  with Rehab  SYMPTOMS   Pain, tenderness, swelling, warmth, or redness over the patellar tendon (just below the kneecap).  Pain and loss of strength (sometimes), with forcefully straightening the knee (especially when jumping or rising from a seated or squatting position), or bending the knee completely (squatting or kneeling).  Crackling sound (crepitation) when the tendon is moved or touched. CAUSES  Patellar tendonitis is caused by injury to the patellar tendon. The inflammation is the body's healing response. Common causes of injury include:  Stress from a sudden increase in intensity, frequency, or duration of training.  Overuse of the quadriceps thigh muscles and patellar tendon.  Direct hit (trauma) to the knee or patellar tendon. RISK INCREASES WITH:  Sports that require sudden, explosive thigh muscle (quadriceps) contraction, such as jumping, quick starts, or kicking.  Running sports, especially running down hills.  Poor strength and flexibility of the thigh and knee.  Flat feet. PREVENTION  Warm up and stretch properly before activity.  Allow for adequate recovery between workouts.  Maintain physical fitness:  Strength, flexibility, and endurance.  Cardiovascular fitness.  Protect the knee joint with taping, protective strapping, bracing, or elastic compression bandage.  Wear arch supports (orthotics). PROGNOSIS  If treated properly, patellar tendonitis usually heals within 6 weeks.  RELATED COMPLICATIONS   Longer healing time, if not properly treated or if not given enough time to heal.  Recurring symptoms, if activity is resumed too soon, with overuse, with a direct blow, or when using poor technique.  If untreated, tendon rupture requiring surgery. TREATMENT Treatment first involves the use of ice and medicine, to reduce pain and inflammation. The use of strengthening and stretching exercises may help reduce pain with activity.  These exercises may be performed at home or with a therapist. Serious cases of tendonitis may require restraining the knee for 10 to 14 days, to prevent stress on the tendon and to promote healing. Crutches may be used (uncommon) until you can walk without a limp. For cases in which non-surgical treatment is unsuccessful, surgery may be advised, to remove the inflamed tendon lining (sheath). Surgery is rare, and is only advised after at least 6 months of non-surgical treatment. MEDICATION   If pain medicine is needed, nonsteroidal anti-inflammatory medicines (aspirin and ibuprofen), or other minor pain relievers (acetaminophen), are often advised.  Do not take pain medicine for 7 days before surgery.  Prescription pain relievers may be given, if your caregiver thinks they are needed. Use only as directed and only as much as you need. HEAT AND COLD  Cold treatment (icing) should be applied for 10 to 15 minutes every 2 to 3 hours for inflammation and pain, and immediately after activity that aggravates your symptoms. Use ice packs or an ice massage.  Heat treatment may be used before performing stretching and strengthening activities prescribed by your caregiver, physical therapist, or athletic trainer. Use a heat pack or a warm water soak. SEEK MEDICAL CARE IF:  Symptoms get worse or do not improve in 2 weeks, despite treatment.  New, unexplained symptoms develop. (Drugs used in treatment may produce side effects.) EXERCISES RANGE OF MOTION (ROM) AND STRETCHING EXERCISES - Patellar Tendinitis (Jumper's Knee) These are some of the initial exercises with which you may start your rehabilitation program, until you see your caregiver again or until your symptoms are resolved. Remember:   Flexible tissue is more tolerant of the stresses placed on it during activities.  Each stretch should be held for 20  to 30 seconds.  A gentle stretching sensation should be felt. STRETCH Hamstrings,  Supine  Lie on your back. Loop a belt or towel over the ball of your right / left foot.  Straighten your right / left knee and slowly pull on the belt to raise your leg. Do not allow the right / left knee to bend. Keep your opposite leg flat on the floor.  Raise the leg until you feel a gentle stretch behind your right / left knee or thigh. Hold this position for __________ seconds. Repeat __________ times. Complete this stretch __________ times per day.  STRETCH - Hamstrings, Doorway  Lie on your back with your right / left leg extended and resting on the wall, and the opposite leg flat on the ground through the door. At first, position your bottom farther away from the wall.  Keep your right / left knee straight. If you feel a stretch behind your knee or thigh, hold this position for __________ seconds.  If you do not feel a stretch, scoot your bottom closer to the door, and hold __________ seconds. Repeat __________ times. Complete this stretch __________ times per day.  STRETCH - Hamstrings, Standing  Stand or sit and extend your right / left leg, placing your foot on a chair or foot stool.  Keep a slight arch in your low back and your hips straight forward.  Lead with your chest and lean forward at the waist until you feel a gentle stretch in the back of your right / left knee or thigh. (When done correctly, this exercise requires leaning only a small distance.)  Hold this position for __________ seconds. Repeat __________ times. Complete this stretch __________ times per day. STRETCH - Adductors, Lunge  While standing, spread your legs, with your right / left leg behind you.  Lean away from your right / left leg by bending your opposite knee. You may rest your hands on your thigh for balance.  You should feel a stretch in your right / left inner thigh. Hold for __________ seconds. Repeat __________ times. Complete this exercise __________ times per day.  STRENGTHENING  EXERCISES - Patellar Tendinitis (Jumper's Knee) These exercises may help you when beginning to rehabilitate your injury. They may resolve your symptoms with or without further involvement from your physician, physical therapist or athletic trainer. While completing these exercises, remember:   Muscles can gain both the endurance and the strength needed for everyday activities through controlled exercises.  Complete these exercises as instructed by your physician, physical therapist or athletic trainer. Increase the resistance and repetitions only as guided by your caregiver. STRENGTH - Quadriceps, Isometrics  Lie on your back with your right / left leg extended and your opposite knee bent.  Gradually tense the muscles in the front of your right / left thigh. You should see either your kneecap slide up toward your hip or increased dimpling just above the knee. This motion will push the back of the knee down toward the floor, mat, or bed on which you are lying.  Hold the muscle as tight as you can, without increasing your pain, for __________ seconds.  Relax the muscles slowly and completely in between each repetition. Repeat __________ times. Complete this exercise __________ times per day.  STRENGTH - Quadriceps, Short Arcs  Lie on your back. Place a __________ inch towel roll under your right / left knee, so that the knee bends slightly.  Raise only your lower leg by tightening the muscles in  the front of your thigh. Do not allow your thigh to rise.  Hold this position for __________ seconds. Repeat __________ times. Complete this exercise __________ times per day.  OPTIONAL ANKLE WEIGHTS: Begin with ____________________, but DO NOT exceed ____________________. Increase in 1 pound/ 0.5 kilogram increments. STRENGTH - Quadriceps, Straight Leg Raises  Quality counts! Watch for signs that the quadriceps muscle is working, to be sure you are strengthening the correct muscles and not "cheating"  by substituting with healthier muscles.  Lay on your back with your right / left leg extended and your opposite knee bent.  Tense the muscles in the front of your right / left thigh. You should see either your kneecap slide up or increased dimpling just above the knee. Your thigh may even shake a bit.  Tighten these muscles even more and raise your leg 4 to 6 inches off the floor. Hold for __________ seconds.  Keeping these muscles tense, lower your leg.  Relax the muscles slowly and completely between each repetition. Repeat __________ times. Complete this exercise __________ times per day.  STRENGTH  Quadriceps, Squats  Stand in a door frame so that your feet and knees are in line with the frame.  Use your hands for balance, not support, on the frame.  Slowly lower your weight, bending at the hips and knees. Keep your lower legs upright so that they are parallel with the door frame. Squat only within the range that does not increase your knee pain. Never let your hips drop below your knees.  Slowly return upright, pushing with your legs, not pulling with your hands. Repeat __________ times. Complete this exercise __________ times per day.  STRENGTH  Quadriceps, Step-Downs  Stand on the edge of a step stool or stair. Be prepared to use a countertop or wall for balance, if needed.  Keeping your right / left knee directly over the middle of your foot, slowly touch your opposite heel to the floor or lower step. Do not go all the way to the floor if your knee pain increases, just go as far as you can without increased discomfort. Use your right / left leg muscles, not gravity to lower your body weight.  Slowly push your body weight back up to the starting position, Repeat __________ times. Complete this exercise __________ times per day.  Document Released: 05/01/2005 Document Revised: 07/24/2011 Document Reviewed: 08/13/2008 [facility] Patient Information 2014 [place],  [place]. Chondromalacia Your exam shows your knee pain is likely due to a cartilage swelling and irritation under the knee cap called chondromalacia. The knee cap moves up and down in its groove when you walk, run, or squat. It can become irritated from sports or work activities if the knee cap is not lined up perfectly or your quadriceps muscle is relatively weak. This can cause pain, usually around the knee cap but sometimes the back of the knee. It is most common in young and active people. Climbing stairs, prolonged sitting and rising from a chair will often make the pain worse. Treatment includes rest from activities which make it worse. The pain can be reduced with ice packs and anti-inflammatory pain medicine. Exercises to strengthen the thigh (quadriceps) muscle may help prevent further episodes of this condition. Shoe inserts to correct imbalances in the legs or feet may be prescribed by your doctor or a specialist. Support for the knee cap with a light brace may also be helpful. Call your caregiver if you are not improving after 2 - 3  weeks of treatment.  SEEK MEDICAL CARE IF:  You have increasing pain or your knee becomes hot, swollen, red, or begins to give out or lock up on you. Document Released: 06/08/2004 Document Revised: 07/24/2011 Document Reviewed: 10/27/2008 [facility] Patient Information 2014 [place], [place].

## 2013-09-09 ENCOUNTER — Encounter: Payer: Self-pay | Admitting: Internal Medicine

## 2013-09-09 ENCOUNTER — Ambulatory Visit (INDEPENDENT_AMBULATORY_CARE_PROVIDER_SITE_OTHER): Payer: BC Managed Care – PPO | Admitting: Internal Medicine

## 2013-09-09 VITALS — BP 142/80 | HR 64 | Temp 97.7°F | Resp 16 | Ht 62.0 in | Wt 163.2 lb

## 2013-09-09 DIAGNOSIS — R7303 Prediabetes: Secondary | ICD-10-CM

## 2013-09-09 DIAGNOSIS — Z79899 Other long term (current) drug therapy: Secondary | ICD-10-CM | POA: Insufficient documentation

## 2013-09-09 DIAGNOSIS — E559 Vitamin D deficiency, unspecified: Secondary | ICD-10-CM

## 2013-09-09 DIAGNOSIS — E785 Hyperlipidemia, unspecified: Secondary | ICD-10-CM

## 2013-09-09 DIAGNOSIS — I10 Essential (primary) hypertension: Secondary | ICD-10-CM

## 2013-09-09 DIAGNOSIS — R7309 Other abnormal glucose: Secondary | ICD-10-CM

## 2013-09-09 LAB — CBC WITH DIFFERENTIAL/PLATELET
Basophils Absolute: 0 10*3/uL (ref 0.0–0.1)
Basophils Relative: 0 % (ref 0–1)
Eosinophils Absolute: 0.1 10*3/uL (ref 0.0–0.7)
Eosinophils Relative: 2 % (ref 0–5)
HCT: 40 % (ref 36.0–46.0)
Hemoglobin: 13.5 g/dL (ref 12.0–15.0)
Lymphocytes Relative: 31 % (ref 12–46)
Lymphs Abs: 2.2 10*3/uL (ref 0.7–4.0)
MCH: 27.3 pg (ref 26.0–34.0)
MCHC: 33.8 g/dL (ref 30.0–36.0)
MCV: 80.8 fL (ref 78.0–100.0)
Monocytes Absolute: 0.5 10*3/uL (ref 0.1–1.0)
Monocytes Relative: 7 % (ref 3–12)
Neutro Abs: 4.3 10*3/uL (ref 1.7–7.7)
Neutrophils Relative %: 60 % (ref 43–77)
Platelets: 265 10*3/uL (ref 150–400)
RBC: 4.95 MIL/uL (ref 3.87–5.11)
RDW: 14.7 % (ref 11.5–15.5)
WBC: 7.1 10*3/uL (ref 4.0–10.5)

## 2013-09-09 LAB — HEPATIC FUNCTION PANEL
ALT: 28 U/L (ref 0–35)
AST: 21 U/L (ref 0–37)
Albumin: 4.3 g/dL (ref 3.5–5.2)
Alkaline Phosphatase: 53 U/L (ref 39–117)
Bilirubin, Direct: 0.1 mg/dL (ref 0.0–0.3)
Indirect Bilirubin: 0.3 mg/dL (ref 0.2–1.2)
Total Bilirubin: 0.4 mg/dL (ref 0.2–1.2)
Total Protein: 7.5 g/dL (ref 6.0–8.3)

## 2013-09-09 LAB — LIPID PANEL
Cholesterol: 160 mg/dL (ref 0–200)
HDL: 30 mg/dL — ABNORMAL LOW (ref >39)
Total CHOL/HDL Ratio: 5.3 Ratio
Triglycerides: 434 mg/dL — ABNORMAL HIGH (ref <150)

## 2013-09-09 LAB — BASIC METABOLIC PANEL WITH GFR
BUN: 9 mg/dL (ref 6–23)
CO2: 27 mEq/L (ref 19–32)
Calcium: 9.5 mg/dL (ref 8.4–10.5)
Chloride: 103 mEq/L (ref 96–112)
Creat: 0.83 mg/dL (ref 0.50–1.10)
GFR, Est African American: >89 mL/min
GFR, Est Non African American: 84 mL/min
Glucose, Bld: 105 mg/dL — ABNORMAL HIGH (ref 70–99)
Potassium: 4.4 mEq/L (ref 3.5–5.3)
Sodium: 137 mEq/L (ref 135–145)

## 2013-09-09 LAB — HEMOGLOBIN A1C
Hgb A1c MFr Bld: 5.9 % — ABNORMAL HIGH (ref <5.7)
Mean Plasma Glucose: 123 mg/dL — ABNORMAL HIGH (ref <117)

## 2013-09-09 LAB — TSH: TSH: 0.848 u[IU]/mL (ref 0.350–4.500)

## 2013-09-09 LAB — MAGNESIUM: Magnesium: 2.1 mg/dL (ref 1.5–2.5)

## 2013-09-09 MED ORDER — LISINOPRIL 20 MG PO TABS: 20.0000 mg | ORAL_TABLET | Freq: Every day | ORAL | Status: DC

## 2013-09-09 NOTE — Patient Instructions (Signed)

## 2013-09-09 NOTE — Progress Notes (Signed)
Patient ID: Allison BonesSharon Laurent, female   DOB: 01/06/1966, 48 y.o.   MRN: [id number]    This very nice 48 y.o. MWF presents for 3 month follow up with Hypertension, Hyperlipidemia, Hypothyroidism, Abn Glucose and Vitamin D Deficiency.    HTN predates since 2000. BP has been controlled at home. Today's BP: 142/80 mmHg. She reports home BP's have been running in the 140-150's range.Patient denies any cardiac type chest pain, palpitations, dyspnea/orthopnea/PND, dizziness, claudication, or dependent edema.   Hyperlipidemia is controlled with diet & meds. Last Cholesterol in Jan 2015 was 169, Triglycerides were 417, HDL 30 and LDL not calculated. Patient denies myalgias or other med SE's.    Also, the patient is screened for PreDiabetes/insulin resistance with last A1c of 5.7% and elevated insulin 47. Patient denies any symptoms of reactive hypoglycemia, diabetic polys, paresthesias or visual blurring.   Further, Patient has history of Vitamin D Deficiency of 20 in 2013 and last vitamin D was 23 still extremely low in Oct 2014. Patient has not supplemented vitamin D as recommended.  Medication Sig  . bisoprolol-hydrochlorothiazide [facility](ZIAC) 10-6.25  Take 1 tablet by mouth daily.    . fenofibrate micronized (LOFIBRA) 134 MG  Take 1 capsule (134 mg total) by mouth daily.  Marland Kitchen. levothyroxine  112 MCG tablet Take 112 mcg by mouth daily before breakfast.    Allergies  Allergen Reactions  . Codeine   . Ppd [Tuberculin Purified Protein Derivative]     Positive reaction 2001   PMHx:   Past Medical History  Diagnosis Date  . Hypothyroidism   . Hypertension   . High cholesterol     FHx:    Reviewed / unchanged  SHx:    Reviewed / unchanged   Systems Review: Constitutional: Denies fever, chills, wt changes, headaches, insomnia, fatigue, night sweats, change in appetite. Eyes: Denies redness, blurred vision, diplopia, discharge, itchy, watery eyes.  ENT: Denies discharge, congestion, post nasal drip,  epistaxis, sore throat, earache, hearing loss, dental pain, tinnitus, vertigo, sinus pain, snoring.  CV: Denies chest pain, palpitations, irregular heartbeat, syncope, dyspnea, diaphoresis, orthopnea, PND, claudication, edema. Respiratory: denies cough, dyspnea, DOE, pleurisy, hoarseness, laryngitis, wheezing.  Gastrointestinal: Denies dysphagia, odynophagia, heartburn, reflux, water brash, abdominal pain or cramps, nausea, vomiting, bloating, diarrhea, constipation, hematemesis, melena, hematochezia,  or hemorrhoids. Genitourinary: Denies dysuria, frequency, urgency, nocturia, hesitancy, discharge, hematuria, flank pain. Musculoskeletal: Denies arthralgias, myalgias, stiffness, jt. swelling, pain, limp, strain/sprain.  Skin: Denies pruritus, rash, hives, warts, acne, eczema, change in skin lesion(s). Neuro: No weakness, tremor, incoordination, spasms, paresthesia, or pain. Psychiatric: Denies confusion, memory loss, or sensory loss. Endo: Denies change in weight, skin, hair change.  Heme/Lymph: No excessive bleeding, bruising, orenlarged lymph nodes.   Exam:  BP 142/80  Pulse 64  Temp 97.7 F   Resp 16  Ht 5\' 2"    Wt 163 lb 3.2 oz   BMI 29.84 kg/m2  Appears well nourished - in no distress. Eyes: PERRLA, EOMs, conjunctiva no swelling or erythema. Sinuses: No frontal/maxillary tenderness ENT/Mouth: EAC's clear, TM's nl w/o erythema, bulging. Nares clear w/o erythema, swelling, exudates. Oropharynx clear without erythema or exudates. Oral hygiene is good. Tongue normal, non obstructing. Hearing intact.  Neck: Supple. Thyroid nl. Car 2+/2+ without bruits, nodes or JVD. Chest: Respirations nl with BS clear & equal w/o rales, rhonchi, wheezing or stridor.  Cor: Heart sounds normal w/ regular rate and rhythm without sig. murmurs, gallops, clicks, or rubs. Peripheral pulses normal and equal  without edema.  Abdomen: Soft &  bowel sounds normal. Non-tender w/o guarding, rebound, hernias, masses,  or organomegaly.  Lymphatics: Unremarkable.  Musculoskeletal: Full ROM all peripheral extremities, joint stability, 5/5 strength, and normal gait.  Skin: Warm, dry without exposed rashes, lesions, ecchymosis apparent.  Neuro: Cranial nerves intact, reflexes equal bilaterally. Sensory-motor testing grossly intact. Tendon reflexes grossly intact.  Pysch: Alert & oriented x 3. Insight and judgement nl & appropriate. No ideations.  Assessment and Plan:  1. Hypertension - Continue monitor blood pressure at home. Continue diet & add Lisinopril 20 mg to meds..  2. Hyperlipidemia - Continue diet/meds, exercise,& lifestyle modifications. Continue monitor periodic cholesterol/liver & renal functions   3. Pre-diabetes/Insulin Resistance, screening - Continue diet, exercise, lifestyle modifications. Monitor appropriate labs.  4. Vitamin D Deficiency - Continue supplementation.  5. Hypothyroidism   Recommended regular exercise, BP monitoring, weight control, and discussed med and SE's. Recommended labs to assess and monitor clinical status. Further disposition pending results of labs.

## 2013-09-10 LAB — VITAMIN D 25 HYDROXY (VIT D DEFICIENCY, FRACTURES): Vit D, 25-Hydroxy: 21 ng/mL — ABNORMAL LOW (ref 30–89)

## 2013-09-10 LAB — INSULIN, FASTING: Insulin fasting, serum: 33 u[IU]/mL — ABNORMAL HIGH (ref 3–28)

## 2013-12-01 ENCOUNTER — Other Ambulatory Visit: Payer: Self-pay | Admitting: Internal Medicine

## 2013-12-01 ENCOUNTER — Telehealth: Payer: Self-pay | Admitting: *Deleted

## 2013-12-01 MED ORDER — ALPRAZOLAM 1 MG PO TABS: 1.0000 mg | ORAL_TABLET | Freq: Every evening | ORAL | Status: AC | PRN

## 2013-12-01 NOTE — Telephone Encounter (Signed)
Patient called requesting Rx to help relieve anxiety symptoms with flying.  Per Dr. Oneta RackMcKeown, rx for Xanax 1 mg called into [facility] [address] [phone]).

## 2013-12-12 ENCOUNTER — Ambulatory Visit: Payer: Self-pay | Admitting: Emergency Medicine

## 2013-12-16 ENCOUNTER — Encounter: Payer: Self-pay | Admitting: Physician Assistant

## 2013-12-16 ENCOUNTER — Ambulatory Visit (INDEPENDENT_AMBULATORY_CARE_PROVIDER_SITE_OTHER): Payer: BC Managed Care – PPO | Admitting: Physician Assistant

## 2013-12-16 VITALS — BP 138/84 | HR 66 | Temp 98.2°F | Resp 16 | Ht 62.0 in | Wt 168.0 lb

## 2013-12-16 DIAGNOSIS — E039 Hypothyroidism, unspecified: Secondary | ICD-10-CM

## 2013-12-16 DIAGNOSIS — Z79899 Other long term (current) drug therapy: Secondary | ICD-10-CM

## 2013-12-16 DIAGNOSIS — E559 Vitamin D deficiency, unspecified: Secondary | ICD-10-CM

## 2013-12-16 DIAGNOSIS — E785 Hyperlipidemia, unspecified: Secondary | ICD-10-CM

## 2013-12-16 DIAGNOSIS — I10 Essential (primary) hypertension: Secondary | ICD-10-CM

## 2013-12-16 DIAGNOSIS — R7309 Other abnormal glucose: Secondary | ICD-10-CM

## 2013-12-16 LAB — HEPATIC FUNCTION PANEL
ALT: 15 U/L (ref 0–35)
AST: 13 U/L (ref 0–37)
Albumin: 4 g/dL (ref 3.5–5.2)
Alkaline Phosphatase: 49 U/L (ref 39–117)
Bilirubin, Direct: 0.1 mg/dL (ref 0.0–0.3)
Indirect Bilirubin: 0.3 mg/dL (ref 0.2–1.2)
Total Bilirubin: 0.4 mg/dL (ref 0.2–1.2)
Total Protein: 6.9 g/dL (ref 6.0–8.3)

## 2013-12-16 LAB — CBC WITH DIFFERENTIAL/PLATELET
Basophils Absolute: 0 10*3/uL (ref 0.0–0.1)
Basophils Relative: 0 % (ref 0–1)
Eosinophils Absolute: 0.2 10*3/uL (ref 0.0–0.7)
Eosinophils Relative: 2 % (ref 0–5)
HCT: 36.2 % (ref 36.0–46.0)
Hemoglobin: 12.2 g/dL (ref 12.0–15.0)
Lymphocytes Relative: 29 % (ref 12–46)
Lymphs Abs: 2.3 10*3/uL (ref 0.7–4.0)
MCH: 27.4 pg (ref 26.0–34.0)
MCHC: 33.7 g/dL (ref 30.0–36.0)
MCV: 81.3 fL (ref 78.0–100.0)
Monocytes Absolute: 0.7 10*3/uL (ref 0.1–1.0)
Monocytes Relative: 9 % (ref 3–12)
Neutro Abs: 4.7 10*3/uL (ref 1.7–7.7)
Neutrophils Relative %: 60 % (ref 43–77)
Platelets: 249 10*3/uL (ref 150–400)
RBC: 4.45 MIL/uL (ref 3.87–5.11)
RDW: 15.4 % (ref 11.5–15.5)
WBC: 7.8 10*3/uL (ref 4.0–10.5)

## 2013-12-16 LAB — LIPID PANEL
Cholesterol: 130 mg/dL (ref 0–200)
HDL: 26 mg/dL — ABNORMAL LOW (ref >39)
Total CHOL/HDL Ratio: 5 Ratio
Triglycerides: 461 mg/dL — ABNORMAL HIGH (ref <150)

## 2013-12-16 LAB — BASIC METABOLIC PANEL WITH GFR
BUN: 9 mg/dL (ref 6–23)
CO2: 25 mEq/L (ref 19–32)
Calcium: 8.6 mg/dL (ref 8.4–10.5)
Chloride: 102 mEq/L (ref 96–112)
Creat: 0.76 mg/dL (ref 0.50–1.10)
GFR, Est African American: >89 mL/min
GFR, Est Non African American: >89 mL/min
Glucose, Bld: 86 mg/dL (ref 70–99)
Potassium: 4.2 mEq/L (ref 3.5–5.3)
Sodium: 135 mEq/L (ref 135–145)

## 2013-12-16 LAB — MAGNESIUM: Magnesium: 2 mg/dL (ref 1.5–2.5)

## 2013-12-16 LAB — TSH: TSH: 2.133 u[IU]/mL (ref 0.350–4.500)

## 2013-12-16 LAB — HEMOGLOBIN A1C
Hgb A1c MFr Bld: 5.8 % — ABNORMAL HIGH (ref <5.7)
Mean Plasma Glucose: 120 mg/dL — ABNORMAL HIGH (ref <117)

## 2013-12-16 NOTE — Progress Notes (Signed)
Assessment and Plan:  Hypertension: Continue medication, monitor blood pressure at home. Continue DASH diet. Cholesterol: Continue diet and exercise. Check cholesterol.  Pre-diabetes-Continue diet and exercise. Check A1C Vitamin D Def- check level and continue medications.  Hypothyroidism-check TSH level, continue medications the same.   Continue diet and meds as discussed. Further disposition pending results of labs.  HPI 48 y.o. female  presents for 3 month follow up with hypertension, hyperlipidemia, prediabetes and vitamin D. Her blood pressure has been controlled at home, today their BP is BP: 138/84 mmHg She does workout. She denies chest pain, shortness of breath, dizziness.  She is on cholesterol medication, fenofibrate but admits she does not take it daily and denies myalgias. Her cholesterol is not at goal. The cholesterol last visit was:   Lab Results  Component Value Date   CHOL 160 09/09/2013   HDL 30* 09/09/2013   LDLCALC NOT CALC 09/09/2013   TRIG 434* 09/09/2013   CHOLHDL 5.3 09/09/2013   She has been working on diet and exercise for prediabetes, and denies paresthesia of the feet, polydipsia and polyuria. Last A1C in the office was:  Lab Results  Component Value Date   HGBA1C 5.9* 09/09/2013   Patient is on Vitamin D supplement.   Lab Results  Component Value Date   VD25OH 21* 09/09/2013     She is on thyroid medication. Her medication was not changed last visit. Patient denies nervousness, palpitations and weight changes.  Lab Results  Component Value Date   TSH 0.848 09/09/2013  .   Current Medications:  Current Outpatient Prescriptions on File Prior to Visit  Medication Sig Dispense Refill  . ALPRAZolam (XANAX) 1 MG tablet Take 1 tablet (1 mg total) by mouth at bedtime as needed for sleep.  30 tablet  0  . bisoprolol-hydrochlorothiazide (ZIAC) 10-6.25 MG per tablet Take 1 tablet by mouth daily.        . fenofibrate micronized (LOFIBRA) 134 MG capsule Take 1  capsule (134 mg total) by mouth daily.  30 capsule  3  . levothyroxine (SYNTHROID, LEVOTHROID) 112 MCG tablet Take 112 mcg by mouth daily before breakfast.       No current facility-administered medications on file prior to visit.   Medical History:  Past Medical History  Diagnosis Date  . Hypothyroidism   . Hypertension   . High cholesterol    Allergies:  Allergies  Allergen Reactions  . Codeine   . Ppd [Tuberculin Purified Protein Derivative]     Positive reaction 2001    Review of Systems: [X]  = complains of  [ ]  = denies  General: Fatigue [ ]  Fever [ ]  Chills [ ]  Weakness [ ]   Insomnia [ ]  Eyes: Redness [ ]  Blurred vision [ ]  Diplopia [ ]   ENT: Congestion [ ]  Sinus Pain [ ]  Post Nasal Drip [ ]  Sore Throat [ ]  Earache [ ]   Cardiac: Chest pain/pressure [ ]  SOB [ ]  Orthopnea [ ]   Palpitations [ ]   Paroxysmal nocturnal dyspnea[ ]  Claudication [ ]  Edema [ ]   Pulmonary: Cough [ ]  Wheezing[ ]   SOB [ ]   Snoring [ ]   GI: Nausea [ ]  Vomiting[ ]  Dysphagia[ ]  Heartburn[ ]  Abdominal pain [ ]  Constipation [ ] ; Diarrhea [ ] ; BRBPR [ ]  Melena[ ]  GU: Hematuria[ ]  Dysuria [ ]  Nocturia[ ]  Urgency [ ]   Hesitancy [ ]  Discharge [ ]  Neuro: Headaches[ ]  Vertigo[ ]  Paresthesias[ ]  Spasm [ ]  Speech changes [ ]  Incoordination [ ]   Ortho: Arthritis [ ]  Joint pain [ ]  Muscle pain [ ]  Joint swelling [ ]  Back Pain [ ]  Skin:  Rash [ ]   Pruritis [ ]  Change in skin lesion [ ]   Psych: Depression[ ]  Anxiety[ ]  Confusion [ ]  Memory loss [ ]   Heme/Lypmh: Bleeding [ ]  Bruising [ ]  Enlarged lymph nodes [ ]   Endocrine: Visual blurring [ ]  Paresthesia [ ]  Polyuria [ ]  Polydypsea [ ]    Heat/cold intolerance [ ]  Hypoglycemia [ ]   Family history- Review and unchanged Social history- Review and unchanged Physical Exam: BP 138/84  Pulse 66  Temp(Src) 98.2 F (36.8 C) (Temporal)  Resp 16  Ht 5\' 2"  (1.575 m)  Wt 168 lb (76.204 kg)  BMI 30.72 kg/m2 Wt Readings from Last 3 Encounters:  12/16/13 168 lb (76.204 kg)   09/09/13 163 lb 3.2 oz (74.027 kg)  07/28/13 165 lb (74.844 kg)   General Appearance: Well nourished, in no apparent distress. Eyes: PERRLA, EOMs, conjunctiva no swelling or erythema Sinuses: No Frontal/maxillary tenderness ENT/Mouth: Ext aud canals clear, TMs without erythema, bulging. No erythema, swelling, or exudate on post pharynx.  Tonsils not swollen or erythematous. Hearing normal.  Neck: Supple, thyroid normal.  Respiratory: Respiratory effort normal, BS equal bilaterally without rales, rhonchi, wheezing or stridor.  Cardio: RRR with no MRGs. Brisk peripheral pulses without edema.  Abdomen: Soft, + BS.  Non tender, no guarding, rebound, hernias, masses. Lymphatics: Non tender without lymphadenopathy.  Musculoskeletal: Full ROM, 5/5 strength, normal gait.  Skin: Warm, dry without rashes, lesions, ecchymosis.  Neuro: Cranial nerves intact. Normal muscle tone, no cerebellar symptoms. Sensation intact.  Psych: Awake and oriented X 3, normal affect, Insight and Judgment appropriate.    Quentin MullingCollier, Jayland Null 9:48 AM

## 2013-12-16 NOTE — Patient Instructions (Signed)

## 2013-12-17 LAB — INSULIN, FASTING: Insulin fasting, serum: 31 u[IU]/mL — ABNORMAL HIGH (ref 3–28)

## 2013-12-17 LAB — VITAMIN D 25 HYDROXY (VIT D DEFICIENCY, FRACTURES): Vit D, 25-Hydroxy: 29 ng/mL — ABNORMAL LOW (ref 30–89)

## 2014-01-16 ENCOUNTER — Other Ambulatory Visit: Payer: Self-pay | Admitting: *Deleted

## 2014-01-16 MED ORDER — BISOPROLOL-HYDROCHLOROTHIAZIDE 10-6.25 MG PO TABS: 1.0000 | ORAL_TABLET | Freq: Every day | ORAL | Status: DC

## 2014-03-10 ENCOUNTER — Other Ambulatory Visit: Payer: Self-pay | Admitting: Women's Health

## 2014-03-10 DIAGNOSIS — Z1231 Encounter for screening mammogram for malignant neoplasm of breast: Secondary | ICD-10-CM

## 2014-03-11 ENCOUNTER — Encounter: Payer: Self-pay | Admitting: Internal Medicine

## 2014-03-16 ENCOUNTER — Encounter: Payer: Self-pay | Admitting: Physician Assistant

## 2014-03-19 ENCOUNTER — Ambulatory Visit (INDEPENDENT_AMBULATORY_CARE_PROVIDER_SITE_OTHER): Payer: BC Managed Care – PPO | Admitting: Women's Health

## 2014-03-19 ENCOUNTER — Encounter: Payer: Self-pay | Admitting: Women's Health

## 2014-03-19 VITALS — BP 174/80 | Ht 62.0 in | Wt 168.0 lb

## 2014-03-19 DIAGNOSIS — Z01419 Encounter for gynecological examination (general) (routine) without abnormal findings: Secondary | ICD-10-CM

## 2014-03-19 NOTE — Progress Notes (Signed)
Allison BonesSharon Contreras 11/09/1965 [id number]    History:    Presents for annual exam with no complaints.  Cycles every 1-2 months/ withdrawal. Is okay with pregnancy.  Hypothyroid/hypercholesteremia/hypertension-primary care manages labs and meds. 2014 Normal Pap and normal mammogram history. 2012 ASCUS with negative HR HPV.   Past medical history, past surgical history, family history and social history were all reviewed and documented in the EPIC chart. 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 Huntley DecSara 16, [name] 26 both doing well. Mother diabetes, died from lymphoma, father hypertension died of heart disease.  ROS:  A  12 point ROS was performed and pertinent positives and negatives are included.  Exam:  Filed Vitals:   03/19/14 0859  BP: 174/80    General appearance:  Normal Thyroid:  Symmetrical, normal in size, without palpable masses or nodularity. Respiratory  Auscultation:  Clear without wheezing or rhonchi Cardiovascular  Auscultation:  Regular rate, without rubs, murmurs or gallops  Edema/varicosities:  Not grossly evident Abdominal  Soft,nontender, without masses, guarding or rebound.  Liver/spleen:  No organomegaly noted  Hernia:  None appreciated  Skin  Inspection:  Grossly normal   Breasts: Examined lying and sitting.     Right: Without masses, retractions, discharge or axillary adenopathy.     Left: Without masses, retractions, discharge or axillary adenopathy. Gentitourinary   Inguinal/mons:  Normal without inguinal adenopathy  External genitalia:  Normal  BUS/Urethra/Skene's glands:  Normal  Vagina:  Normal  Cervix:  Normal  Uterus:   normal in size, shape and contour.  Midline and mobile  Adnexa/parametria:     Rt: Without masses or tenderness.   Lt: Without masses or tenderness.  Anus and perineum: Normal  Digital rectal exam: Normal sphincter tone without palpated masses or tenderness  Assessment/Plan:  48 y.o.  MWF G2P2 for annual exam with no  complaints.     Cycles every 1-2 months/withdrawal Hypothyroid/hypercholesteremia/hypertension-primary care labs and meds  Plan: UA. Pap normal 2014, new screening guidelines reviewed. SBE's, continue annual mammogram, calcium rich diet, vitamin D 1000 daily. Reviewed importance of regular exercise.  Reviewed need for colonoscopy at age 48.  Declines contraception.   Harrington ChallengerYOUNG,Nikkia Devoss J [facility] Regional Medical CenterWHNP, 9:27 AM 03/19/2014

## 2014-03-19 NOTE — Patient Instructions (Signed)

## 2014-03-20 LAB — URINALYSIS W MICROSCOPIC + REFLEX CULTURE
Bacteria, UA: NONE SEEN
Bilirubin Urine: NEGATIVE
Casts: NONE SEEN
Crystals: NONE SEEN
Glucose, UA: NEGATIVE mg/dL
Hgb urine dipstick: NEGATIVE
Ketones, ur: NEGATIVE mg/dL
Leukocytes, UA: NEGATIVE
Nitrite: NEGATIVE
Protein, ur: NEGATIVE mg/dL
Specific Gravity, Urine: 1.009 (ref 1.005–1.030)
Squamous Epithelial / LPF: NONE SEEN
Urobilinogen, UA: 0.2 mg/dL (ref 0.0–1.0)
pH: 5 (ref 5.0–8.0)

## 2014-03-31 ENCOUNTER — Encounter: Payer: Self-pay | Admitting: Internal Medicine

## 2014-03-31 ENCOUNTER — Ambulatory Visit (INDEPENDENT_AMBULATORY_CARE_PROVIDER_SITE_OTHER): Payer: BC Managed Care – PPO | Admitting: Internal Medicine

## 2014-03-31 VITALS — BP 138/82 | HR 60 | Temp 98.1°F | Resp 16 | Ht 62.0 in | Wt 170.0 lb

## 2014-03-31 DIAGNOSIS — E039 Hypothyroidism, unspecified: Secondary | ICD-10-CM

## 2014-03-31 DIAGNOSIS — I10 Essential (primary) hypertension: Secondary | ICD-10-CM

## 2014-03-31 DIAGNOSIS — Z79899 Other long term (current) drug therapy: Secondary | ICD-10-CM

## 2014-03-31 DIAGNOSIS — R7989 Other specified abnormal findings of blood chemistry: Secondary | ICD-10-CM

## 2014-03-31 DIAGNOSIS — E785 Hyperlipidemia, unspecified: Secondary | ICD-10-CM

## 2014-03-31 DIAGNOSIS — R7309 Other abnormal glucose: Secondary | ICD-10-CM

## 2014-03-31 DIAGNOSIS — R6889 Other general symptoms and signs: Secondary | ICD-10-CM

## 2014-03-31 DIAGNOSIS — R945 Abnormal results of liver function studies: Secondary | ICD-10-CM

## 2014-03-31 DIAGNOSIS — Z0001 Encounter for general adult medical examination with abnormal findings: Secondary | ICD-10-CM

## 2014-03-31 DIAGNOSIS — E559 Vitamin D deficiency, unspecified: Secondary | ICD-10-CM

## 2014-03-31 DIAGNOSIS — R7303 Prediabetes: Secondary | ICD-10-CM

## 2014-03-31 DIAGNOSIS — Z113 Encounter for screening for infections with a predominantly sexual mode of transmission: Secondary | ICD-10-CM

## 2014-03-31 DIAGNOSIS — Z1212 Encounter for screening for malignant neoplasm of rectum: Secondary | ICD-10-CM

## 2014-03-31 NOTE — Progress Notes (Signed)
Patient ID: Allison BonesSharon Luffman, female   DOB: 04/13/1966, 48 y.o.   MRN: [id number]  Annual Screening Comprehensive Examination  This very nice 48 y.o.MWF presents for complete physical.  Patient has been followed for HTN,  Prediabetes, Hyperlipidemia, and Vitamin D Deficiency. Patient also has been on thyroid replacement since 1997.    HTN predates since 2000. Patient's BP has been controlled at home and patient denies any cardiac symptoms as chest pain, palpitations, shortness of breath, dizziness or ankle swelling. Today's BP: 138/82 mmHg    Patient's hyperlipidemia is controlled with diet and medications. Patient denies myalgias or other medication SE's. Last lipids were not at goal with patient admitting poor medicine compliance and Total Chol 130; HDL 26*; LDL  NOT CALC; with high Trig 461 on 12/16/2013.   Patient has Morbid Obesity (BMI 30+) and consequent prediabetes predating since Nov 2014 with A1c 5.7% and elevated insulin 41  and patient denies reactive hypoglycemic symptoms, visual blurring, diabetic polys, or paresthesias. Last A1c was  5.8% on 12/16/2013.   Finally, patient has history of Vitamin D Deficiency of 20 in 2013 and 16 in 2014 as patient has poor compliance with meds and last Vitamin D was  29 on 12/16/2013.  Medication Sig  . ALPRAZolam  1 MG tablet Take 1 tablet  at bedtime as needed for sleep.  . bisoprolol-hctz 10-6.25 Take 1 tablet  daily.  . fenofibrate  134 MG capsule Take 1 capsule  daily.  Marland Kitchen. levothyroxine 112 MCG tablet Take  daily before breakfast.   Allergies  Allergen Reactions  . Codeine   . Ppd [Tuberculin Purified Protein Derivative]     Positive reaction 2001   Past Medical History  Diagnosis Date  . Hypothyroidism   . Hypertension   . High cholesterol    Health Maintenance  Topic Date Due  . INFLUENZA VACCINE  12/13/2013  . PAP SMEAR  03/18/2014  . MAMMOGRAM  03/31/2014  . TETANUS/TDAP  08/01/2022   Immunization History  Administered Date(s)  Administered  . Td 06/29/2001, 07/31/2012   Past Surgical History  Procedure Laterality Date  . Lymph node dissection      NECK- BENIGN   Family History  Problem Relation Age of Onset  . Cancer Mother     NON HODGKINS LYMPHOMA  . Hypertension Mother   . Diabetes Mother   . Hyperlipidemia Mother   . Hypertension Father   . Heart disease Father    History  Substance Use Topics  . Smoking status: Never Smoker   . Smokeless tobacco: Never Used  . Alcohol Use: Yes     Comment: occ.    ROS Constitutional: Denies fever, chills, weight loss/gain, headaches, insomnia, fatigue, night sweats, and change in appetite. Eyes: Denies redness, blurred vision, diplopia, discharge, itchy, watery eyes.  ENT: Denies discharge, congestion, post nasal drip, epistaxis, sore throat, earache, hearing loss, dental pain, Tinnitus, Vertigo, Sinus pain, snoring.  Cardio: Denies chest pain, palpitations, irregular heartbeat, syncope, dyspnea, diaphoresis, orthopnea, PND, claudication, edema Respiratory: denies cough, dyspnea, DOE, pleurisy, hoarseness, laryngitis, wheezing.  Gastrointestinal: Denies dysphagia, heartburn, reflux, water brash, pain, cramps, nausea, vomiting, bloating, diarrhea, constipation, hematemesis, melena, hematochezia, jaundice, hemorrhoids Genitourinary: Denies dysuria, frequency, urgency, nocturia, hesitancy, discharge, hematuria, flank pain Breast: Breast lumps, nipple discharge, bleeding.  Musculoskeletal: Denies arthralgia, myalgia, stiffness, Jt. Swelling, pain, limp, and strain/sprain. Denies falls. Skin: Denies puritis, rash, hives, warts, acne, eczema, changing in skin lesion Neuro: No weakness, tremor, incoordination, spasms, paresthesia, pain Psychiatric: Denies confusion, memory loss,  sensory loss. Denies Depression. Endocrine: Denies change in weight, skin, hair change, nocturia, and paresthesia, diabetic polys, visual blurring, hyper / hypo glycemic episodes.  Heme/Lymph:  No excessive bleeding, bruising, enlarged lymph nodes.   Physical Exam  BP 138/82   P  60  T  98.1 F   Resp 16  Ht 5\' 2"    Wt 170 lb   BMI 31.09   LMP 02/20/14  General Appearance: Over nourished and in no apparent distress. Eyes: PERRLA, EOMs, conjunctiva no swelling or erythema, normal fundi and vessels. Sinuses: No frontal/maxillary tenderness ENT/Mouth: EACs patent / TMs  nl. Nares clear without erythema, swelling, mucoid exudates. Oral hygiene is good. No erythema, swelling, or exudate. Tongue normal, non-obstructing. Tonsils not swollen or erythematous. Hearing normal.  Neck: Supple, thyroid normal. No bruits, nodes or JVD. Respiratory: Respiratory effort normal.  BS equal and clear bilateral without rales, rhonci, wheezing or stridor. Cardio: Heart sounds are normal with regular rate and rhythm and no murmurs, rubs or gallops. Peripheral pulses are normal and equal bilaterally without edema. No aortic or femoral bruits. Chest: symmetric with normal excursions and percussion. Breasts: Deferred to Maryelizabeth RowanNancy Young, NP Abdomen: Flat, soft, with bowl sounds. Nontender, no guarding, rebound, hernias, masses, or organomegaly.  Lymphatics: Non tender without lymphadenopathy.  Genitourinary: Deferred to Maryelizabeth RowanNancy Young, NP Musculoskeletal: Full ROM all peripheral extremities, joint stability, 5/5 strength, and normal gait. Skin: Warm and dry without rashes, lesions, cyanosis, clubbing or  ecchymosis.  Neuro: Cranial nerves intact, reflexes equal bilaterally. Normal muscle tone, no cerebellar symptoms. Sensation intact.  Pysch: Awake and oriented X 3, normal affect, Insight and Judgment appropriate.   Assessment and Plan  1. Annual Screening Examination 2. Hypertension  3. Hyperlipidemia 4. Pre Diabetes 5. Vitamin D Deficiency 6. Morbid Obesity (BMI 30+)   Continue prudent diet as discussed, weight control, BP monitoring, regular exercise, and medications. Discussed med's effects and  SE's. Screening labs and tests as requested with regular follow-up as recommended.

## 2014-03-31 NOTE — Patient Instructions (Signed)
Recommend the book "The END of DIETING" by Dr Baker Janus   and the book "The END of DIABETES " by Dr Excell Seltzer  At [email or web address] - get book & Audio CD's      Being diabetic has a  300% increased risk for heart attack, stroke, cancer, and alzheimer- type vascular dementia. It is very important that you work harder with diet by avoiding all foods that are white except chicken & fish. Avoid white rice (brown & wild rice is OK), white potatoes (sweetpotatoes in moderation is OK), White bread or wheat bread or anything made out of white flour like bagels, donuts, rolls, buns, biscuits, cakes, pastries, cookies, pizza crust, and pasta (made from white flour & egg whites) - vegetarian pasta or spinach or wheat pasta is OK. Multigrain breads like Arnold's or [employer], or multigrain sandwich thins or flatbreads.  Diet, exercise and weight loss can reverse and cure diabetes in the early stages.  Diet, exercise and weight loss is very important in the control and prevention of complications of diabetes which affects every system in your body, ie. Brain - dementia/stroke, eyes - glaucoma/blindness, heart - heart attack/heart failure, kidneys - dialysis, stomach - gastric paralysis, intestines - malabsorption, nerves - severe painful neuritis, circulation - gangrene & loss of a leg(s), and finally cancer and Alzheimers.    I recommend avoid fried & greasy foods,  sweets/candy, white rice (brown or wild rice or Quinoa is OK), white potatoes (sweet potatoes are OK) - anything made from white flour - bagels, doughnuts, rolls, buns, biscuits,white and wheat breads, pizza crust and traditional pasta made of white flour & egg white(vegetarian pasta or spinach or wheat pasta is OK).  Multi-grain bread is OK - like multi-grain flat bread or sandwich thins. Avoid alcohol in excess. Exercise is also important.    Eat all the vegetables you want - avoid meat, especially red meat and dairy - especially cheese.  Cheese  is the most concentrated form of trans-fats which is the worst thing to clog up our arteries. Veggie cheese is OK which can be found in the fresh produce section at [employer] or [employer] or [employer]  Preventive Care for Adults A healthy lifestyle and preventive care can promote health and wellness. Preventive health guidelines for women include the following key practices.  A routine yearly physical is a good way to check with your health care provider about your health and preventive screening. It is a chance to share any concerns and updates on your health and to receive a thorough exam.  Visit your dentist for a routine exam and preventive care every 6 months. Brush your teeth twice a day and floss once a day. Good oral hygiene prevents tooth decay and gum disease.  The frequency of eye exams is based on your age, health, family medical history, use of contact lenses, and other factors. Follow your health care provider's recommendations for frequency of eye exams.  Eat a healthy diet. Foods like vegetables, fruits, whole grains, low-fat dairy products, and lean protein foods contain the nutrients you need without too many calories. Decrease your intake of foods high in solid fats, added sugars, and salt. Eat the right amount of calories for you.Get information about a proper diet from your health care provider, if necessary.  Regular physical exercise is one of the most important things you can do for your health. Most adults should get at least 150 minutes of moderate-intensity exercise (any activity that increases  your heart rate and causes you to sweat) each week. In addition, most adults need muscle-strengthening exercises on 2 or more days a week.  Maintain a healthy weight. The body mass index (BMI) is a screening tool to identify possible weight problems. It provides an estimate of body fat based on height and weight. Your health care provider can find your BMI and can help you  achieve or maintain a healthy weight.For adults 20 years and older:  A BMI below 18.5 is considered underweight.  A BMI of 18.5 to 24.9 is normal.  A BMI of 25 to 29.9 is considered overweight.  A BMI of 30 and above is considered obese.  Maintain normal blood lipids and cholesterol levels by exercising and minimizing your intake of saturated fat. Eat a balanced diet with plenty of fruit and vegetables. Blood tests for lipids and cholesterol should begin at age 38 and be repeated every 5 years. If your lipid or cholesterol levels are high, you are over 50, or you are at high risk for heart disease, you may need your cholesterol levels checked more frequently.Ongoing high lipid and cholesterol levels should be treated with medicines if diet and exercise are not working.  If you smoke, find out from your health care provider how to quit. If you do not use tobacco, do not start.  Lung cancer screening is recommended for adults aged 64-80 years who are at high risk for developing lung cancer because of a history of smoking. A yearly low-dose CT scan of the lungs is recommended for people who have at least a 30-pack-year history of smoking and are a current smoker or have quit within the past 15 years. A pack year of smoking is smoking an average of 1 pack of cigarettes a day for 1 year (for example: 1 pack a day for 30 years or 2 packs a day for 15 years). Yearly screening should continue until the smoker has stopped smoking for at least 15 years. Yearly screening should be stopped for people who develop a health problem that would prevent them from having lung cancer treatment.  High blood pressure causes heart disease and increases the risk of stroke. Your blood pressure should be checked at least every 1 to 2 years. Ongoing high blood pressure should be treated with medicines if weight loss and exercise do not work.  If you are 79-57 years old, ask your health care provider if you should take  aspirin to prevent strokes.  Diabetes screening involves taking a blood sample to check your fasting blood sugar level. This should be done once every 3 years, after age 37, if you are within normal weight and without risk factors for diabetes. Testing should be considered at a younger age or be carried out more frequently if you are overweight and have at least 1 risk factor for diabetes.  Breast cancer screening is essential preventive care for women. You should practice "breast self-awareness." This means understanding the normal appearance and feel of your breasts and may include breast self-examination. Any changes detected, no matter how small, should be reported to a health care provider. Women in their 76s and 30s should have a clinical breast exam (CBE) by a health care provider as part of a regular health exam every 1 to 3 years. After age 55, women should have a CBE every year. Starting at age 79, women should consider having a mammogram (breast X-ray test) every year. Women who have a family history of breast  cancer should talk to their health care provider about genetic screening. Women at a high risk of breast cancer should talk to their health care providers about having an MRI and a mammogram every year.  Breast cancer gene (BRCA)-related cancer risk assessment is recommended for women who have family members with BRCA-related cancers. BRCA-related cancers include breast, ovarian, tubal, and peritoneal cancers. Having family members with these cancers may be associated with an increased risk for harmful changes (mutations) in the breast cancer genes BRCA1 and BRCA2. Results of the assessment will determine the need for genetic counseling and BRCA1 and BRCA2 testing.  Routine pelvic exams to screen for cancer are no longer recommended for nonpregnant women who are considered low risk for cancer of the pelvic organs (ovaries, uterus, and vagina) and who do not have symptoms. Ask your health  care provider if a screening pelvic exam is right for you.  If you have had past treatment for cervical cancer or a condition that could lead to cancer, you need Pap tests and screening for cancer for at least 20 years after your treatment. If Pap tests have been discontinued, your risk factors (such as having a new sexual partner) need to be reassessed to determine if screening should be resumed. Some women have medical problems that increase the chance of getting cervical cancer. In these cases, your health care provider may recommend more frequent screening and Pap tests.  Colorectal cancer can be detected and often prevented. Most routine colorectal cancer screening begins at the age of 58 years and continues through age 44 years. However, your health care provider may recommend screening at an earlier age if you have risk factors for colon cancer. On a yearly basis, your health care provider may provide home test kits to check for hidden blood in the stool. Use of a small camera at the end of a tube, to directly examine the colon (sigmoidoscopy or colonoscopy), can detect the earliest forms of colorectal cancer. Talk to your health care provider about this at age 84, when routine screening begins. Direct exam of the colon should be repeated every 5-10 years through age 33 years, unless early forms of pre-cancerous polyps or small growths are found.  Hepatitis C blood testing is recommended for all people born from [birth year] through 1965 and any individual with known risks for hepatitis C.  Pra  Osteoporosis is a disease in which the bones lose minerals and strength with aging. This can result in serious bone fractures or breaks. The risk of osteoporosis can be identified using a bone density scan. Women ages 36 years and over and women at risk for fractures or osteoporosis should discuss screening with their health care providers. Ask your health care provider whether you should take a calcium supplement  or vitamin D to reduce the rate of osteoporosis.  Menopause can be associated with physical symptoms and risks. Hormone replacement therapy is available to decrease symptoms and risks. You should talk to your health care provider about whether hormone replacement therapy is right for you.  Use sunscreen. Apply sunscreen liberally and repeatedly throughout the day. You should seek shade when your shadow is shorter than you. Protect yourself by wearing long sleeves, pants, a wide-brimmed hat, and sunglasses year round, whenever you are outdoors.  Once a month, do a whole body skin exam, using a mirror to look at the skin on your back. Tell your health care provider of new moles, moles that have irregular borders, moles that are  larger than a pencil eraser, or moles that have changed in shape or color.  Stay current with required vaccines (immunizations).  Influenza vaccine. All adults should be immunized every year.  Tetanus, diphtheria, and acellular pertussis (Td, Tdap) vaccine. Pregnant women should receive 1 dose of Tdap vaccine during each pregnancy. The dose should be obtained regardless of the length of time since the last dose. Immunization is preferred during the 27th-36th week of gestation. An adult who has not previously received Tdap or who does not know her vaccine status should receive 1 dose of Tdap. This initial dose should be followed by tetanus and diphtheria toxoids (Td) booster doses every 10 years. Adults with an unknown or incomplete history of completing a 3-dose immunization series with Td-containing vaccines should begin or complete a primary immunization series including a Tdap dose. Adults should receive a Td booster every 10 years.  Varicella vaccine. An adult without evidence of immunity to varicella should receive 2 doses or a second dose if she has previously received 1 dose. Pregnant females who do not have evidence of immunity should receive the first dose after  pregnancy. This first dose should be obtained before leaving the health care facility. The second dose should be obtained 4-8 weeks after the first dose.  Human papillomavirus (HPV) vaccine. Females aged 13-26 years who have not received the vaccine previously should obtain the 3-dose series. The vaccine is not recommended for use in pregnant females. However, pregnancy testing is not needed before receiving a dose. If a female is found to be pregnant after receiving a dose, no treatment is needed. In that case, the remaining doses should be delayed until after the pregnancy. Immunization is recommended for any person with an immunocompromised condition through the age of 26 years if she did not get any or all doses earlier. During the 3-dose series, the second dose should be obtained 4-8 weeks after the first dose. The third dose should be obtained 24 weeks after the first dose and 16 weeks after the second dose.  Zoster vaccine. One dose is recommended for adults aged 60 years or older unless certain conditions are present.  Measles, mumps, and rubella (MMR) vaccine. Adults born before 1957 generally are considered immune to measles and mumps. Adults born in 1957 or later should have 1 or more doses of MMR vaccine unless there is a contraindication to the vaccine or there is laboratory evidence of immunity to each of the three diseases. A routine second dose of MMR vaccine should be obtained at least 28 days after the first dose for students attending postsecondary schools, health care workers, or international travelers. People who received inactivated measles vaccine or an unknown type of measles vaccine during 1963-1967 should receive 2 doses of MMR vaccine. People who received inactivated mumps vaccine or an unknown type of mumps vaccine before 1979 and are at high risk for mumps infection should consider immunization with 2 doses of MMR vaccine. For females of childbearing age, rubella immunity should  be determined. If there is no evidence of immunity, females who are not pregnant should be vaccinated. If there is no evidence of immunity, females who are pregnant should delay immunization until after pregnancy. Unvaccinated health care workers born before 1957 who lack laboratory evidence of measles, mumps, or rubella immunity or laboratory confirmation of disease should consider measles and mumps immunization with 2 doses of MMR vaccine or rubella immunization with 1 dose of MMR vaccine.  Pneumococcal 13-valent conjugate (PCV13) vaccine. When   indicated, a person who is uncertain of her immunization history and has no record of immunization should receive the PCV13 vaccine. An adult aged 73 years or older who has certain medical conditions and has not been previously immunized should receive 1 dose of PCV13 vaccine. This PCV13 should be followed with a dose of pneumococcal polysaccharide (PPSV23) vaccine. The PPSV23 vaccine dose should be obtained at least 8 weeks after the dose of PCV13 vaccine. An adult aged 81 years or older who has certain medical conditions and previously received 1 or more doses of PPSV23 vaccine should receive 1 dose of PCV13. The PCV13 vaccine dose should be obtained 1 or more years after the last PPSV23 vaccine dose.    Pneumococcal polysaccharide (PPSV23) vaccine. When PCV13 is also indicated, PCV13 should be obtained first. All adults aged 69 years and older should be immunized. An adult younger than age 35 years who has certain medical conditions should be immunized. Any person who resides in a nursing home or long-term care facility should be immunized. An adult smoker should be immunized. People with an immunocompromised condition and certain other conditions should receive both PCV13 and PPSV23 vaccines. People with human immunodeficiency virus (HIV) infection should be immunized as soon as possible after diagnosis. Immunization during chemotherapy or radiation therapy should  be avoided. Routine use of PPSV23 vaccine is not recommended for American Indians, [place] Natives, or people younger than 65 years unless there are medical conditions that require PPSV23 vaccine. When indicated, people who have unknown immunization and have no record of immunization should receive PPSV23 vaccine. One-time revaccination 5 years after the first dose of PPSV23 is recommended for people aged 19-64 years who have chronic kidney failure, nephrotic syndrome, asplenia, or immunocompromised conditions. People who received 1-2 doses of PPSV23 before age 79 years should receive another dose of PPSV23 vaccine at age 73 years or later if at least 5 years have passed since the previous dose. Doses of PPSV23 are not needed for people immunized with PPSV23 at or after age 19 years.  Preventive Services / Frequency   Ages 25 to 65 years  Blood pressure check.  Lipid and cholesterol check.  Lung cancer screening. / Every year if you are aged 8-80 years and have a 30-pack-year history of smoking and currently smoke or have quit within the past 15 years. Yearly screening is stopped once you have quit smoking for at least 15 years or develop a health problem that would prevent you from having lung cancer treatment.  Clinical breast exam.** / Every year after age 28 years.  BRCA-related cancer risk assessment.** / For women who have family members with a BRCA-related cancer (breast, ovarian, tubal, or peritoneal cancers).  Mammogram.** / Every year beginning at age 57 years and continuing for as long as you are in good health. Consult with your health care provider.  Pap test.** / Every 3 years starting at age 57 years through age 25 or 13 years with a history of 3 consecutive normal Pap tests.  HPV screening.** / Every 3 years from ages 21 years through ages 60 to 48 years with a history of 3 consecutive normal Pap tests.  Fecal occult blood test (FOBT) of stool. / Every year beginning at age 77  years and continuing until age 57 years. You may not need to do this test if you get a colonoscopy every 10 years.  Flexible sigmoidoscopy or colonoscopy.** / Every 5 years for a flexible sigmoidoscopy or every 10 years  for a colonoscopy beginning at age 104 years and continuing until age 39 years.  Hepatitis C blood test.** / For all people born from [birth year] through 1965 and any individual with known risks for hepatitis C.  Skin self-exam. / Monthly.  Influenza vaccine. / Every year.  Tetanus, diphtheria, and acellular pertussis (Tdap/Td) vaccine.** / Consult your health care provider. Pregnant women should receive 1 dose of Tdap vaccine during each pregnancy. 1 dose of Td every 10 years.  Varicella vaccine.** / Consult your health care provider. Pregnant females who do not have evidence of immunity should receive the first dose after pregnancy.  Zoster vaccine.** / 1 dose for adults aged 47 years or older.  Pneumococcal 13-valent conjugate (PCV13) vaccine.** / Consult your health care provider.  Pneumococcal polysaccharide (PPSV23) vaccine.** / 1 to 2 doses if you smoke cigarettes or if you have certain conditions.  Meningococcal vaccine.** / Consult your health care provider.  Hepatitis A vaccine.** / Consult your health care provider.  Hepatitis B vaccine.** / Consult your health care provider. Screening for abdominal aortic aneurysm (AAA)  by ultrasound is recommended for people over 50 who have history of high blood pressure or who are current or former smokers.

## 2014-04-01 LAB — CBC WITH DIFFERENTIAL/PLATELET
Basophils Absolute: 0 10*3/uL (ref 0.0–0.1)
Basophils Relative: 0 % (ref 0–1)
Eosinophils Absolute: 0.2 10*3/uL (ref 0.0–0.7)
Eosinophils Relative: 2 % (ref 0–5)
HCT: 38 % (ref 36.0–46.0)
Hemoglobin: 12.6 g/dL (ref 12.0–15.0)
Lymphocytes Relative: 31 % (ref 12–46)
Lymphs Abs: 2.5 10*3/uL (ref 0.7–4.0)
MCH: 27.3 pg (ref 26.0–34.0)
MCHC: 33.2 g/dL (ref 30.0–36.0)
MCV: 82.3 fL (ref 78.0–100.0)
MPV: 9.8 fL (ref 9.4–12.4)
Monocytes Absolute: 0.6 10*3/uL (ref 0.1–1.0)
Monocytes Relative: 8 % (ref 3–12)
Neutro Abs: 4.8 10*3/uL (ref 1.7–7.7)
Neutrophils Relative %: 59 % (ref 43–77)
Platelets: 281 10*3/uL (ref 150–400)
RBC: 4.62 MIL/uL (ref 3.87–5.11)
RDW: 15.7 % — ABNORMAL HIGH (ref 11.5–15.5)
WBC: 8.1 10*3/uL (ref 4.0–10.5)

## 2014-04-01 LAB — BASIC METABOLIC PANEL WITH GFR
BUN: 7 mg/dL (ref 6–23)
CO2: 22 mEq/L (ref 19–32)
Calcium: 9.4 mg/dL (ref 8.4–10.5)
Chloride: 102 mEq/L (ref 96–112)
Creat: 0.77 mg/dL (ref 0.50–1.10)
GFR, Est African American: >89 mL/min
GFR, Est Non African American: >89 mL/min
Glucose, Bld: 93 mg/dL (ref 70–99)
Potassium: 4.1 mEq/L (ref 3.5–5.3)
Sodium: 138 mEq/L (ref 135–145)

## 2014-04-01 LAB — LIPID PANEL
Cholesterol: 177 mg/dL (ref 0–200)
HDL: 26 mg/dL — ABNORMAL LOW (ref >39)
Total CHOL/HDL Ratio: 6.8 Ratio
Triglycerides: 783 mg/dL — ABNORMAL HIGH (ref <150)

## 2014-04-01 LAB — HEMOGLOBIN A1C
Hgb A1c MFr Bld: 6.1 % — ABNORMAL HIGH (ref <5.7)
Mean Plasma Glucose: 128 mg/dL — ABNORMAL HIGH (ref <117)

## 2014-04-01 LAB — HEPATIC FUNCTION PANEL
ALT: 29 U/L (ref 0–35)
AST: 23 U/L (ref 0–37)
Albumin: 4.3 g/dL (ref 3.5–5.2)
Alkaline Phosphatase: 55 U/L (ref 39–117)
Bilirubin, Direct: 0.1 mg/dL (ref 0.0–0.3)
Indirect Bilirubin: 0.2 mg/dL (ref 0.2–1.2)
Total Bilirubin: 0.3 mg/dL (ref 0.2–1.2)
Total Protein: 7.7 g/dL (ref 6.0–8.3)

## 2014-04-01 LAB — URINALYSIS, MICROSCOPIC ONLY
Bacteria, UA: NONE SEEN
Casts: NONE SEEN
Crystals: NONE SEEN
Squamous Epithelial / LPF: NONE SEEN

## 2014-04-01 LAB — HEPATITIS C ANTIBODY: HCV Ab: NEGATIVE

## 2014-04-01 LAB — MICROALBUMIN / CREATININE URINE RATIO
Creatinine, Urine: 14.9 mg/dL
Microalb, Ur: <0.2 mg/dL (ref <2.0)

## 2014-04-01 LAB — INSULIN, FASTING: Insulin fasting, serum: 8.7 u[IU]/mL (ref 2.0–19.6)

## 2014-04-01 LAB — HEPATITIS A ANTIBODY, TOTAL: Hep A Total Ab: NONREACTIVE

## 2014-04-01 LAB — MAGNESIUM: Magnesium: 2.2 mg/dL (ref 1.5–2.5)

## 2014-04-01 LAB — HEPATITIS B CORE ANTIBODY, TOTAL: Hep B Core Total Ab: NONREACTIVE

## 2014-04-01 LAB — VITAMIN D 25 HYDROXY (VIT D DEFICIENCY, FRACTURES): Vit D, 25-Hydroxy: 17 ng/mL — ABNORMAL LOW (ref 30–100)

## 2014-04-01 LAB — HEPATITIS B SURFACE ANTIBODY,QUALITATIVE: Hep B S Ab: NEGATIVE

## 2014-04-02 ENCOUNTER — Ambulatory Visit (HOSPITAL_COMMUNITY)
Admission: RE | Admit: 2014-04-02 | Discharge: 2014-04-02 | Disposition: A | Discharge status: Home / Self Care | Payer: BC Managed Care – PPO | Source: Ambulatory Visit | Attending: Women's Health | Admitting: Women's Health

## 2014-04-02 ENCOUNTER — Encounter: Payer: Self-pay | Admitting: Women's Health

## 2014-04-02 DIAGNOSIS — Z1231 Encounter for screening mammogram for malignant neoplasm of breast: Secondary | ICD-10-CM | POA: Diagnosis present

## 2014-04-02 LAB — VITAMIN B12: Vitamin B-12: 506 pg/mL (ref 211–911)

## 2014-04-02 LAB — TSH: TSH: 2.01 u[IU]/mL (ref 0.350–4.500)

## 2014-04-03 LAB — HEPATITIS B E ANTIBODY: Hepatitis Be Antibody: NONREACTIVE

## 2014-04-20 ENCOUNTER — Other Ambulatory Visit: Payer: Self-pay | Admitting: *Deleted

## 2014-04-20 MED ORDER — LEVOTHYROXINE SODIUM 112 MCG PO TABS: 112.0000 ug | ORAL_TABLET | Freq: Every day | ORAL | Status: DC

## 2014-05-28 ENCOUNTER — Other Ambulatory Visit: Payer: Self-pay

## 2014-05-28 MED ORDER — BISOPROLOL-HYDROCHLOROTHIAZIDE 10-6.25 MG PO TABS: 1.0000 | ORAL_TABLET | Freq: Every day | ORAL | Status: DC

## 2014-06-02 ENCOUNTER — Other Ambulatory Visit: Payer: Self-pay

## 2014-06-02 MED ORDER — BISOPROLOL-HYDROCHLOROTHIAZIDE 10-6.25 MG PO TABS: 1.0000 | ORAL_TABLET | Freq: Every day | ORAL | Status: DC

## 2014-06-02 NOTE — Telephone Encounter (Signed)
Patient called and request 90 days vs 30, corrected via EMR

## 2014-06-22 ENCOUNTER — Ambulatory Visit: Payer: Self-pay | Admitting: Physician Assistant

## 2014-06-23 ENCOUNTER — Ambulatory Visit: Payer: Self-pay | Admitting: Physician Assistant

## 2014-07-24 ENCOUNTER — Ambulatory Visit (INDEPENDENT_AMBULATORY_CARE_PROVIDER_SITE_OTHER): Payer: No Typology Code available for payment source | Admitting: Physician Assistant

## 2014-07-24 ENCOUNTER — Encounter: Payer: Self-pay | Admitting: Physician Assistant

## 2014-07-24 VITALS — BP 138/80 | HR 60 | Temp 97.7°F | Resp 16 | Ht 62.0 in | Wt 171.0 lb

## 2014-07-24 DIAGNOSIS — Z79899 Other long term (current) drug therapy: Secondary | ICD-10-CM

## 2014-07-24 DIAGNOSIS — E039 Hypothyroidism, unspecified: Secondary | ICD-10-CM

## 2014-07-24 DIAGNOSIS — E785 Hyperlipidemia, unspecified: Secondary | ICD-10-CM

## 2014-07-24 DIAGNOSIS — E559 Vitamin D deficiency, unspecified: Secondary | ICD-10-CM

## 2014-07-24 DIAGNOSIS — D509 Iron deficiency anemia, unspecified: Secondary | ICD-10-CM

## 2014-07-24 DIAGNOSIS — E669 Obesity, unspecified: Secondary | ICD-10-CM

## 2014-07-24 DIAGNOSIS — I10 Essential (primary) hypertension: Secondary | ICD-10-CM

## 2014-07-24 DIAGNOSIS — R7309 Other abnormal glucose: Secondary | ICD-10-CM

## 2014-07-24 DIAGNOSIS — R7303 Prediabetes: Secondary | ICD-10-CM

## 2014-07-24 LAB — BASIC METABOLIC PANEL WITH GFR
BUN: 10 mg/dL (ref 6–23)
CO2: 21 mEq/L (ref 19–32)
Calcium: 9.1 mg/dL (ref 8.4–10.5)
Chloride: 103 mEq/L (ref 96–112)
Creat: 0.74 mg/dL (ref 0.50–1.10)
GFR, Est African American: >89 mL/min
GFR, Est Non African American: >89 mL/min
Glucose, Bld: 106 mg/dL — ABNORMAL HIGH (ref 70–99)
Potassium: 3.9 mEq/L (ref 3.5–5.3)
Sodium: 135 mEq/L (ref 135–145)

## 2014-07-24 LAB — CBC WITH DIFFERENTIAL/PLATELET
Basophils Absolute: 0 10*3/uL (ref 0.0–0.1)
Basophils Relative: 0 % (ref 0–1)
Eosinophils Absolute: 0.1 10*3/uL (ref 0.0–0.7)
Eosinophils Relative: 2 % (ref 0–5)
HCT: 37.8 % (ref 36.0–46.0)
Hemoglobin: 12.9 g/dL (ref 12.0–15.0)
Lymphocytes Relative: 31 % (ref 12–46)
Lymphs Abs: 2.1 10*3/uL (ref 0.7–4.0)
MCH: 27.9 pg (ref 26.0–34.0)
MCHC: 34.1 g/dL (ref 30.0–36.0)
MCV: 81.8 fL (ref 78.0–100.0)
MPV: 9.3 fL (ref 8.6–12.4)
Monocytes Absolute: 0.5 10*3/uL (ref 0.1–1.0)
Monocytes Relative: 7 % (ref 3–12)
Neutro Abs: 4 10*3/uL (ref 1.7–7.7)
Neutrophils Relative %: 60 % (ref 43–77)
Platelets: 282 10*3/uL (ref 150–400)
RBC: 4.62 MIL/uL (ref 3.87–5.11)
RDW: 15.4 % (ref 11.5–15.5)
WBC: 6.7 10*3/uL (ref 4.0–10.5)

## 2014-07-24 LAB — IRON AND TIBC
%SAT: 11 % — ABNORMAL LOW (ref 20–55)
Iron: 40 ug/dL — ABNORMAL LOW (ref 42–145)
TIBC: 371 ug/dL (ref 250–470)
UIBC: 331 ug/dL (ref 125–400)

## 2014-07-24 LAB — LIPID PANEL
Cholesterol: 180 mg/dL (ref 0–200)
HDL: 15 mg/dL — ABNORMAL LOW (ref >=46)
Total CHOL/HDL Ratio: 12 Ratio
Triglycerides: 1143 mg/dL — ABNORMAL HIGH (ref <150)

## 2014-07-24 LAB — HEMOGLOBIN A1C
Hgb A1c MFr Bld: 6.2 % — ABNORMAL HIGH (ref <5.7)
Mean Plasma Glucose: 131 mg/dL — ABNORMAL HIGH (ref <117)

## 2014-07-24 LAB — HEPATIC FUNCTION PANEL
ALT: 26 U/L (ref 0–35)
AST: 18 U/L (ref 0–37)
Albumin: 4.2 g/dL (ref 3.5–5.2)
Alkaline Phosphatase: 48 U/L (ref 39–117)
Bilirubin, Direct: 0.1 mg/dL (ref 0.0–0.3)
Indirect Bilirubin: 0.2 mg/dL (ref 0.2–1.2)
Total Bilirubin: 0.3 mg/dL (ref 0.2–1.2)
Total Protein: 7.3 g/dL (ref 6.0–8.3)

## 2014-07-24 LAB — TSH: TSH: 2.249 u[IU]/mL (ref 0.350–4.500)

## 2014-07-24 LAB — MAGNESIUM: Magnesium: 2.1 mg/dL (ref 1.5–2.5)

## 2014-07-24 LAB — FERRITIN: Ferritin: 14 ng/mL (ref 10–291)

## 2014-07-24 NOTE — Progress Notes (Signed)
Assessment and Plan:  Hypertension: Continue medication, monitor blood pressure at home. Continue DASH diet.  Reminder to go to the ER if any CP, SOB, nausea, dizziness, severe HA, changes vision/speech, left arm numbness and tingling, and jaw pain. Cholesterol: Continue diet and exercise. Check cholesterol. LONG discussion about trigs and risk pancreatitis/stroke/MI.  Pre-diabetes-Continue diet and exercise. Check A1C Vitamin D Def- check level and continue medications.  Obesity with co morbidities- long discussion about weight loss, diet, and exercise Hypothyroidism-check TSH level, continue medications the same, reminded to take on an empty stomach 30-55mins before food.    Continue diet and meds as discussed. Further disposition pending results of labs.  HPI 49 y.o. female  presents for 3 month follow up  has a past medical history of Hypothyroidism; Hypertension; and High cholesterol.  Her blood pressure has been controlled at home, today their BP is BP: 138/80 mmHg  She does not workout. She denies chest pain, shortness of breath, dizziness.  She is on cholesterol medication, fenofibrate but admits to not taking it daily, she also drinks sweet tea and denies myalgias. Her cholesterol is not at goal. The cholesterol last visit was:   Lab Results  Component Value Date   CHOL 177 03/31/2014   HDL 26* 03/31/2014   LDLCALC NOT CALC 03/31/2014   TRIG 783* 03/31/2014   CHOLHDL 6.8 03/31/2014    She has been working on diet and exercise for prediabetes, and denies paresthesia of the feet, polydipsia, polyuria and visual disturbances. Last A1C in the office was:  Lab Results  Component Value Date   HGBA1C 6.1* 03/31/2014   Patient is on Vitamin D supplement.   Lab Results  Component Value Date   VD25OH 17* 03/31/2014     BMI is Body mass index is 31.27 kg/(m^2)., she is working on diet and exercise. Wt Readings from Last 3 Encounters:  07/24/14 171 lb (77.565 kg)  03/31/14 170 lb  (77.111 kg)  03/19/14 168 lb (76.204 kg)   She is on thyroid medication. Her medication was not changed last visit.   Lab Results  Component Value Date   TSH 2.010 03/31/2014  .   Current Medications:  Current Outpatient Prescriptions on File Prior to Visit  Medication Sig Dispense Refill  . ALPRAZolam (XANAX) 1 MG tablet Take 1 tablet (1 mg total) by mouth at bedtime as needed for sleep. 30 tablet 0  . bisoprolol-hydrochlorothiazide (ZIAC) 10-6.25 MG per tablet Take 1 tablet by mouth daily. 90 tablet 3  . fenofibrate micronized (LOFIBRA) 134 MG capsule Take 1 capsule (134 mg total) by mouth daily. 30 capsule 3  . levothyroxine (SYNTHROID, LEVOTHROID) 112 MCG tablet Take 1 tablet (112 mcg total) by mouth daily before breakfast. 90 tablet 2   No current facility-administered medications on file prior to visit.   Medical History:  Past Medical History  Diagnosis Date  . Hypothyroidism   . Hypertension   . High cholesterol    Allergies:  Allergies  Allergen Reactions  . Codeine   . Ppd [Tuberculin Purified Protein Derivative]     Positive reaction 2001     Review of Systems:  Review of Systems  Constitutional: Negative.   HENT: Negative.   Eyes: Negative.   Respiratory: Negative.   Cardiovascular: Negative.   Gastrointestinal: Negative.   Genitourinary: Negative.   Musculoskeletal: Negative.   Skin: Negative.   Neurological: Negative.   Endo/Heme/Allergies: Negative.   Psychiatric/Behavioral: Negative.     Family history- Review and unchanged Social  history- Review and unchanged Physical Exam: BP 138/80 mmHg  Pulse 60  Temp(Src) 97.7 F (36.5 C)  Resp 16  Ht 5\' 2"  (1.575 m)  Wt 171 lb (77.565 kg)  BMI 31.27 kg/m2 Wt Readings from Last 3 Encounters:  07/24/14 171 lb (77.565 kg)  03/31/14 170 lb (77.111 kg)  03/19/14 168 lb (76.204 kg)   General Appearance: Well nourished, in no apparent distress. Eyes: PERRLA, EOMs, conjunctiva no swelling or  erythema Sinuses: No Frontal/maxillary tenderness ENT/Mouth: Ext aud canals clear, TMs without erythema, bulging. No erythema, swelling, or exudate on post pharynx.  Tonsils not swollen or erythematous. Hearing normal.  Neck: Supple, thyroid normal.  Respiratory: Respiratory effort normal, BS equal bilaterally without rales, rhonchi, wheezing or stridor.  Cardio: RRR with no MRGs. Brisk peripheral pulses without edema.  Abdomen: Soft, + BS,  Non tender, no guarding, rebound, hernias, masses. Lymphatics: Non tender without lymphadenopathy.  Musculoskeletal: Full ROM, 5/5 strength, Normal gait Skin: Warm, dry without rashes, lesions, ecchymosis.  Neuro: Cranial nerves intact. Normal muscle tone, no cerebellar symptoms. Psych: Awake and oriented X 3, normal affect, Insight and Judgment appropriate.    Quentin Mullingollier, [place] Wherry, PA-C 8:48 AM [facility]

## 2014-07-24 NOTE — Patient Instructions (Signed)
Your trigs are not in range, 03/31/2014: Triglycerides 783*. Triglycerides are simple sugars in blood that are converted into a storage form. I recommend you avoid fried/greasy foods, sweets/candy, white rice , white potatoes,  anything made from white flour, sweet tea, soda, fruit juices and avoid alcohol in excess. Sweet potatoes, brown/wild rice/Quinoa, Vegetarian, spinach, or wheat pasta, Multi-grain bread - like multi-grain flat bread or sandwich thins are okay. This is elevated enough to cause acute pancreatitis which can put you in the hospital and kill you. VERY VERY important to be strict with diet.   Diabetes is a very complicated disease...lets simplify it.  An easy way to look at it to understand the complications is if you think of the extra sugar floating in your blood stream as glass shards floating through your blood stream.    Diabetes affects your small vessels first: 1) The glass shards (sugar) scraps down the tiny blood vessels in your eyes and lead to diabetic retinopathy, the leading cause of blindness in the [place]. Diabetes is the leading cause of newly diagnosed adult (1120 to 49 years of age) blindness in the [place].  2) The glass shards scratches down the tiny vessels of your legs leading to nerve damage called neuropathy and can lead to amputations of your feet. More than 60% of all non-traumatic amputations of lower limbs occur in people with diabetes.  3) Over time the small vessels in your brain are shredded and closed off, individually this does not cause any problems but over a long period of time many of the small vessels being blocked can lead to Vascular Dementia.   4) Your kidney's are a filter system and have a "net" that keeps certain things in the body and lets bad things out. Sugar shreds this net and leads to kidney damage and eventually failure. Decreasing the sugar that is destroying the net and certain blood pressure medications can help stop or decrease  progression of kidney disease. Diabetes was the primary cause of kidney failure in 44 percent of all new cases in 2011.  5) Diabetes also destroys the small vessels in your penis that lead to erectile dysfunction. Eventually the vessels are so damaged that you may not be responsive to cialis or viagra.   Diabetes and your large vessels: Your larger vessels consist of your coronary arteries in your heart and the carotid vessels to your brain. Diabetes or even increased sugars put you at 300% increased risk of heart attack and stroke and this is why.. The sugar scrapes down your large blood vessels and your body sees this as an internal injury and tries to repair itself. Just like you get a scab on your skin, your platelets will stick to the blood vessel wall trying to heal it. This is why we have diabetics on low dose aspirin daily, this prevents the platelets from sticking and can prevent plaque formation. In addition, your body takes cholesterol and tries to shove it into the open wound. This is why we want your LDL, or bad cholesterol, below 70.   The combination of platelets and cholesterol over 5-10 years forms plaque that can break off and cause a heart attack or stroke.   PLEASE REMEMBER:  Diabetes is preventable! Up to 85 percent of complications and morbidities among individuals with type 2 diabetes can be prevented, delayed, or effectively treated and minimized with regular visits to a health professional, appropriate monitoring and medication, and a healthy diet and lifestyle.  Before  you even begin to attack a weight-loss plan, it pays to remember this: You are not fat. You have fat. Losing weight isn't about blame or shame; it's simply another achievement to accomplish. Dieting is like any other skill-you have to buckle down and work at it. As long as you act in a smart, reasonable way, you'll ultimately get where you want to be. Here are some weight loss pearls for you.  1. It's Not a  Diet. It's a Lifestyle Thinking of a diet as something you're on and suffering through only for the short term doesn't work. To shed weight and keep it off, you need to make permanent changes to the way you eat. It's OK to indulge occasionally, of course, but if you cut calories temporarily and then revert to your old way of eating, you'll gain back the weight quicker than you can say yo-yo. Use it to lose it. Research shows that one of the best predictors of long-term weight loss is how many pounds you drop in the first month. For that reason, nutritionists often suggest being stricter for the first two weeks of your new eating strategy to build momentum. Cut out added sugar and alcohol and avoid unrefined carbs. After that, figure out how you can reincorporate them in a way that's healthy and maintainable.  2. There's a Right Way to Exercise Working out burns calories and fat and boosts your metabolism by building muscle. But those trying to lose weight are notorious for overestimating the number of calories they burn and underestimating the amount they take in. Unfortunately, your system is biologically programmed to hold on to extra pounds and that means when you start exercising, your body senses the deficit and ramps up its hunger signals. If you're not diligent, you'll eat everything you burn and then some. Use it to lose it. Cardio gets all the exercise glory, but strength and interval training are the real heroes. They help you build lean muscle, which in turn increases your metabolism and calorie-burning ability 3. Don't Overreact to Mild Hunger Some people have a hard time losing weight because of hunger anxiety. To them, being hungry is bad-something to be avoided at all costs-so they carry snacks with them and eat when they don't need to. Others eat because they're stressed out or bored. While you never want to get to the point of being ravenous (that's when bingeing is likely to happen), a  hunger pang, a craving, or the fact that it's 3:00 p.m. should not send you racing for the vending machine or obsessing about the energy bar in your purse. Ideally, you should put off eating until your stomach is growling and it's difficult to concentrate.  Use it to lose it. When you feel the urge to eat, use the HALT method. Ask yourself, Am I really hungry? Or am I angry or anxious, lonely or bored, or tired? If you're still not certain, try the apple test. If you're truly hungry, an apple should seem delicious; if it doesn't, something else is going on. Or you can try drinking water and making yourself busy, if you are still hungry try a healthy snack.  4. Not All Calories Are Created Equal The mechanics of weight loss are pretty simple: Take in fewer calories than you use for energy. But the kind of food you eat makes all the difference. Processed food that's high in saturated fat and refined starch or sugar can cause inflammation that disrupts the hormone signals that tell  your brain you're full. The result: You eat a lot more.  Use it to lose it. Clean up your diet. Swap in whole, unprocessed foods, including vegetables, lean protein, and healthy fats that will fill you up and give you the biggest nutritional bang for your calorie buck. In a few weeks, as your brain starts receiving regular hunger and fullness signals once again, you'll notice that you feel less hungry overall and naturally start cutting back on the amount you eat.  5. Protein, Produce, and Plant-Based Fats Are Your Weight-Loss Trinity Here's why eating the three Ps regularly will help you drop pounds. Protein fills you up. You need it to build lean muscle, which keeps your metabolism humming so that you can torch more fat. People in a weight-loss program who ate double the recommended daily allowance for protein (about 110 grams for a 150-pound woman) lost 70 percent of their weight from fat, while people who ate the RDA lost only  about 40 percent, one study found. Produce is packed with filling fiber. "It's very difficult to consume too many calories if you're eating a lot of vegetables. Example: Three cups of broccoli is a lot of food, yet only 93 calories. (Fruit is another story. It can be easy to overeat and can contain a lot of calories from sugar, so be sure to monitor your intake.) Plant-based fats like olive oil and those in avocados and nuts are healthy and extra satiating.  Use it to lose it. Aim to incorporate each of the three Ps into every meal and snack. People who eat protein throughout the day are able to keep weight off, according to a study in the American Journal of Clinical Nutrition. In addition to meat, poultry and seafood, good sources are beans, lentils, eggs, tofu, and yogurt. As for fat, keep portion sizes in check by measuring out salad dressing, oil, and nut butters (shoot for one to two tablespoons). Finally, eat veggies or a little fruit at every meal. People who did that consumed 308 fewer calories but didn't feel any hungrier than when they didn't eat more produce.  7. How You Eat Is As Important As What You Eat In order for your brain to register that you're full, you need to focus on what you're eating. Sit down whenever you eat, preferably at a table. Turn off the TV or computer, put down your phone, and look at your food. Smell it. Chew slowly, and don't put another bite on your fork until you swallow. When women ate lunch this attentively, they consumed 30 percent less when snacking later than those who listened to an audiobook at lunchtime, according to a study in the [place] Journal of Nutrition. 8. Weighing Yourself Really Works The scale provides the best evidence about whether your efforts are paying off. Seeing the numbers tick up or down or stagnate is motivation to keep going-or to rethink your approach. A 2015 study at [facility] found that daily weigh-ins helped people lose more  weight, keep it off, and maintain that loss, even after two years. Use it to lose it. Step on the scale at the same time every day for the best results. If your weight shoots up several pounds from one weigh-in to the next, don't freak out. Eating a lot of salt the night before or having your period is the likely culprit. The number should return to normal in a day or two. It's a steady climb that you need to do something about.  9. Too Much Stress and Too Little Sleep Are Your Enemies When you're tired and frazzled, your body cranks up the production of cortisol, the stress hormone that can cause carb cravings. Not getting enough sleep also boosts your levels of ghrelin, a hormone associated with hunger, while suppressing leptin, a hormone that signals fullness and satiety. People on a diet who slept only five and a half hours a night for two weeks lost 55 percent less fat and were hungrier than those who slept eight and a half hours, according to a study in the [place] [employer] Journal. Use it to lose it. Prioritize sleep, aiming for seven hours or more a night, which research shows helps lower stress. And make sure you're getting quality zzz's. If a snoring spouse or a fidgety cat wakes you up frequently throughout the night, you may end up getting the equivalent of just four hours of sleep, according to a study from [facility]. Keep pets out of the bedroom, and use a white-noise app to drown out snoring. 10. You Will Hit a plateau-And You Can Bust Through It As you slim down, your body releases much less leptin, the fullness hormone.  If you're not strength training, start right now. Building muscle can raise your metabolism to help you overcome a plateau. To keep your body challenged and burning calories, incorporate new moves and more intense intervals into your workouts or add another sweat session to your weekly routine. Alternatively, cut an extra 100 calories or so a day from your  diet. Now that you've lost weight, your body simply doesn't need as much fuel.   Ways to cut 100 calories  1. Eat your eggs with hot sauce OR salsa instead of cheese.  Eggs are great for breakfast, but many people consider eggs and cheese to be BFFs. Instead of cheese-1 oz. of cheddar has 114 calories-top your eggs with hot sauce, which contains no calories and helps with satiety and metabolism. Salsa is also a great option!!  2. Top your toast, waffles or pancakes with mashed berries instead of jelly or syrup. Half a cup of berries-fresh, frozen or thawed-has about 40 calories, compared with 2 tbsp. of maple syrup or jelly, which both have about 100 calories. The berries will also give you a good punch of fiber, which helps keep you full and satisfied and won't spike blood sugar quickly like the jelly or syrup. 3. Swap the non-fat latte for black coffee with a splash of half-and-half. Contrary to its name, that non-fat latte has 130 calories and a startling 19g of carbohydrates per 16 oz. serving. Replacing that 'light' drinkable dessert with a black coffee with a splash of half-and-half saves you more than 100 calories per 16 oz. serving. 4. Sprinkle salads with freeze-dried raspberries instead of dried cranberries. If you want a sweet addition to your nutritious salad, stay away from dried cranberries. They have a whopping 130 calories per  cup and 30g carbohydrates. Instead, sprinkle freeze-dried raspberries guilt-free and save more than 100 calories per  cup serving, adding 3g of belly-filling fiber. 5. Go for mustard in place of mayo on your sandwich. Mustard can add really nice flavor to any sandwich, and there are tons of varieties, from spicy to honey. A serving of mayo is 95 calories, versus 10 calories in a serving of mustard. 6. Choose a DIY salad dressing instead of the store-bought kind. Mix Dijon or whole grain mustard with low-fat Kefir or red wine vinegar and  garlic. 7. Use hummus  as a spread instead of a dip. Use hummus as a spread on a high-fiber cracker or tortilla with a sandwich and save on calories without sacrificing taste. 8. Pick just one salad "accessory." Salad isn't automatically a calorie winner. It's easy to over-accessorize with toppings. Instead of topping your salad with nuts, avocado and cranberries (all three will clock in at 313 calories), just pick one. The next day, choose a different accessory, which will also keep your salad interesting. You don't wear all your jewelry every day, right? 9. Ditch the white pasta in favor of spaghetti squash. One cup of cooked spaghetti squash has about 40 calories, compared with traditional spaghetti, which comes with more than 200. Spaghetti squash is also nutrient-dense. It's a good source of fiber and Vitamins A and C, and it can be eaten just like you would eat pasta-with a great tomato sauce and [place] meatballs or with pesto, tofu and spinach, for example. 10. Dress up your chili, soups and stews with non-fat [place] yogurt instead of sour cream. Just a 'dollop' of sour cream can set you back 115 calories and a whopping 12g of fat-seven of which are of the artery-clogging variety. Added bonus: [place] yogurt is packed with muscle-building protein, calcium and B Vitamins. 11. Mash cauliflower instead of mashed potatoes. One cup of traditional mashed potatoes-in all their creamy goodness-has more than 200 calories, compared to mashed cauliflower, which you can typically eat for less than 100 calories per 1 cup serving. Cauliflower is a great source of the antioxidant indole-3-carbinol (I3C), which may help reduce the risk of some cancers, like breast cancer. 12. Ditch the ice cream sundae in favor of a [place] yogurt parfait. Instead of a cup of ice cream or fro-yo for dessert, try 1 cup of nonfat Greek yogurt topped with fresh berries and a sprinkle of cacao nibs. Both toppings are packed with antioxidants, which can help  reduce cellular inflammation and oxidative damage. And the comparison is a no-brainer: One cup of ice cream has about 275 calories; one cup of frozen yogurt has about 230; and a cup of Greek yogurt has just 130, plus twice the protein, so you're less likely to return to the freezer for a second helping. 13. Put olive oil in a spray container instead of using it directly from the bottle. Each tablespoon of olive oil is 120 calories and 15g of fat. Use a mister instead of pouring it straight into the pan or onto a salad. This allows for portion control and will save you more than 100 calories. 14. When baking, substitute canned pumpkin for butter or oil. Canned pumpkin-not pumpkin pie mix-is loaded with Vitamin A, which is important for skin and eye health, as well as immunity. And the comparisons are pretty crazy:  cup of canned pumpkin has about 40 calories, compared to butter or oil, which has more than 800 calories. Yes, 800 calories. Applesauce and mashed banana can also serve as good substitutions for butter or oil, usually in a 1:1 ratio. 15. Top casseroles with high-fiber cereal instead of breadcrumbs. Breadcrumbs are typically made with white bread, while breakfast cereals contain 5-9g of fiber per serving. Not only will you save more than 150 calories per  cup serving, the swap will also keep you more full and you'll get a metabolism boost from the added fiber. 16. Snack on pistachios instead of macadamia nuts. Believe it or not, you get the same amount of calories from 35  pistachios (100 calories) as you would from only five macadamia nuts. 17. Chow down on kale chips rather than potato chips. This is my favorite 'don't knock it 'till you try it' swap. Kale chips are so easy to make at home, and you can spice them up with a little grated parmesan or chili powder. Plus, they're a mere fraction of the calories of potato chips, but with the same crunch factor we crave so often. 18. Add seltzer and  some fruit slices to your cocktail instead of soda or fruit juice. One cup of soda or fruit juice can pack on as much as 140 calories. Instead, use seltzer and fruit slices. The fruit provides valuable phytochemicals, such as flavonoids and anthocyanins, which help to combat cancer and stave off the aging process.

## 2014-07-25 LAB — INSULIN, RANDOM: Insulin: 30.5 u[IU]/mL — ABNORMAL HIGH (ref 2.0–19.6)

## 2014-07-25 LAB — VITAMIN D 25 HYDROXY (VIT D DEFICIENCY, FRACTURES): Vit D, 25-Hydroxy: 15 ng/mL — ABNORMAL LOW (ref 30–100)

## 2014-08-25 ENCOUNTER — Other Ambulatory Visit: Payer: No Typology Code available for payment source

## 2014-08-25 ENCOUNTER — Other Ambulatory Visit: Payer: Self-pay | Admitting: Internal Medicine

## 2014-08-25 ENCOUNTER — Ambulatory Visit: Payer: Self-pay | Admitting: Internal Medicine

## 2014-08-25 DIAGNOSIS — E785 Hyperlipidemia, unspecified: Secondary | ICD-10-CM

## 2014-08-25 DIAGNOSIS — Z1212 Encounter for screening for malignant neoplasm of rectum: Secondary | ICD-10-CM

## 2014-08-25 LAB — LIPID PANEL
Cholesterol: 170 mg/dL (ref 0–200)
HDL: 21 mg/dL — ABNORMAL LOW (ref >=46)
Total CHOL/HDL Ratio: 8.1 Ratio
Triglycerides: 510 mg/dL — ABNORMAL HIGH (ref <150)

## 2014-09-04 ENCOUNTER — Other Ambulatory Visit: Payer: Self-pay | Admitting: *Deleted

## 2014-09-04 DIAGNOSIS — Z1212 Encounter for screening for malignant neoplasm of rectum: Secondary | ICD-10-CM

## 2014-09-04 LAB — POC HEMOCCULT BLD/STL (HOME/3-CARD/SCREEN)
Card #2 Fecal Occult Blod, POC: NEGATIVE
Card #3 Fecal Occult Blood, POC: NEGATIVE
Fecal Occult Blood, POC: NEGATIVE

## 2014-10-30 ENCOUNTER — Ambulatory Visit (INDEPENDENT_AMBULATORY_CARE_PROVIDER_SITE_OTHER): Payer: No Typology Code available for payment source | Admitting: Internal Medicine

## 2014-10-30 ENCOUNTER — Encounter: Payer: Self-pay | Admitting: Internal Medicine

## 2014-10-30 VITALS — BP 142/84 | HR 60 | Temp 97.5°F | Resp 16 | Ht 62.0 in | Wt 169.2 lb

## 2014-10-30 DIAGNOSIS — E785 Hyperlipidemia, unspecified: Secondary | ICD-10-CM

## 2014-10-30 DIAGNOSIS — E669 Obesity, unspecified: Secondary | ICD-10-CM

## 2014-10-30 DIAGNOSIS — Z79899 Other long term (current) drug therapy: Secondary | ICD-10-CM

## 2014-10-30 DIAGNOSIS — E039 Hypothyroidism, unspecified: Secondary | ICD-10-CM

## 2014-10-30 DIAGNOSIS — Z6828 Body mass index (BMI) 28.0-28.9, adult: Secondary | ICD-10-CM | POA: Insufficient documentation

## 2014-10-30 DIAGNOSIS — R7309 Other abnormal glucose: Secondary | ICD-10-CM

## 2014-10-30 DIAGNOSIS — R7303 Prediabetes: Secondary | ICD-10-CM

## 2014-10-30 DIAGNOSIS — E559 Vitamin D deficiency, unspecified: Secondary | ICD-10-CM

## 2014-10-30 DIAGNOSIS — Z6829 Body mass index (BMI) 29.0-29.9, adult: Secondary | ICD-10-CM | POA: Insufficient documentation

## 2014-10-30 DIAGNOSIS — I10 Essential (primary) hypertension: Secondary | ICD-10-CM

## 2014-10-30 LAB — HEPATIC FUNCTION PANEL
ALT: 36 U/L — ABNORMAL HIGH (ref 0–35)
AST: 23 U/L (ref 0–37)
Albumin: 4.3 g/dL (ref 3.5–5.2)
Alkaline Phosphatase: 51 U/L (ref 39–117)
Bilirubin, Direct: 0.1 mg/dL (ref 0.0–0.3)
Indirect Bilirubin: 0.3 mg/dL (ref 0.2–1.2)
Total Bilirubin: 0.4 mg/dL (ref 0.2–1.2)
Total Protein: 7.6 g/dL (ref 6.0–8.3)

## 2014-10-30 LAB — CBC WITH DIFFERENTIAL/PLATELET
Basophils Absolute: 0 10*3/uL (ref 0.0–0.1)
Basophils Relative: 0 % (ref 0–1)
Eosinophils Absolute: 0.3 10*3/uL (ref 0.0–0.7)
Eosinophils Relative: 3 % (ref 0–5)
HCT: 38.1 % (ref 36.0–46.0)
Hemoglobin: 13 g/dL (ref 12.0–15.0)
Lymphocytes Relative: 30 % (ref 12–46)
Lymphs Abs: 2.6 10*3/uL (ref 0.7–4.0)
MCH: 27.8 pg (ref 26.0–34.0)
MCHC: 34.1 g/dL (ref 30.0–36.0)
MCV: 81.4 fL (ref 78.0–100.0)
MPV: 9.4 fL (ref 8.6–12.4)
Monocytes Absolute: 0.6 10*3/uL (ref 0.1–1.0)
Monocytes Relative: 7 % (ref 3–12)
Neutro Abs: 5.2 10*3/uL (ref 1.7–7.7)
Neutrophils Relative %: 60 % (ref 43–77)
Platelets: 255 10*3/uL (ref 150–400)
RBC: 4.68 MIL/uL (ref 3.87–5.11)
RDW: 14.9 % (ref 11.5–15.5)
WBC: 8.7 10*3/uL (ref 4.0–10.5)

## 2014-10-30 LAB — BASIC METABOLIC PANEL WITH GFR
BUN: 10 mg/dL (ref 6–23)
CO2: 23 mEq/L (ref 19–32)
Calcium: 9.4 mg/dL (ref 8.4–10.5)
Chloride: 100 mEq/L (ref 96–112)
Creat: 0.74 mg/dL (ref 0.50–1.10)
GFR, Est African American: >89 mL/min
GFR, Est Non African American: >89 mL/min
Glucose, Bld: 115 mg/dL — ABNORMAL HIGH (ref 70–99)
Potassium: 4.2 mEq/L (ref 3.5–5.3)
Sodium: 137 mEq/L (ref 135–145)

## 2014-10-30 LAB — TSH: TSH: 2.507 u[IU]/mL (ref 0.350–4.500)

## 2014-10-30 LAB — LIPID PANEL
Cholesterol: 198 mg/dL (ref 0–200)
HDL: 17 mg/dL — ABNORMAL LOW (ref >=46)
Total CHOL/HDL Ratio: 11.6 Ratio
Triglycerides: 905 mg/dL — ABNORMAL HIGH (ref <150)

## 2014-10-30 LAB — MAGNESIUM: Magnesium: 2.1 mg/dL (ref 1.5–2.5)

## 2014-10-30 LAB — HEMOGLOBIN A1C
Hgb A1c MFr Bld: 6.1 % — ABNORMAL HIGH (ref <5.7)
Mean Plasma Glucose: 128 mg/dL — ABNORMAL HIGH (ref <117)

## 2014-10-30 LAB — VITAMIN D 25 HYDROXY (VIT D DEFICIENCY, FRACTURES): Vit D, 25-Hydroxy: 22 ng/mL — ABNORMAL LOW (ref 30–100)

## 2014-10-30 NOTE — Progress Notes (Signed)
Patient ID: Allison Contreras, female   DOB: [birth year], 49 y.o.   MRN: [id number]    This very nice 49 y.o. MWF  presents for 6 month follow up with Hypertension, Hyperlipidemia, Pre-Diabetes, Hypothyroidism and Vitamin D Deficiency.     Patient is treated for HTN & BP has been controlled at home. Today's BP was 142/84. Marland Kitchen Patient has had no complaints of any cardiac type chest pain, palpitations, dyspnea/orthopnea/PND, dizziness, claudication, or dependent edema.Patient is exercising regularly in Zumba & using a treadmill.     Hyperlipidemia is controlled with diet & meds. Patient denies myalgias or other med SE's. Last Lipids were  Chol  170; HDL 21; LDL - NOT CALC; and elevated Trig 510 on 08/25/2014.    Also, the patient has history of Morbid Obesity (BMI 30.94) & consequent PreDiabetes and has had no symptoms of reactive hypoglycemia, diabetic polys, paresthesias or visual blurring.  Last A1c was 6.2% on 07/24/2014.    Patient has maintained euthyroid status since on thyroid replacement.  Further, the patient also has history of Vitamin D Deficiency of 23 in Oct 2014 and supplements vitamin D without any suspected side-effects. Last vitamin D was 15 on 07/24/2014.  Medication Sig  . bisoprolol-hctz  [facility]) 10-6.25  Take 1 tablet by mouth daily.  . Fenofibrate  134 MG capsule Take 1 capsule (134 mg total) by mouth daily.  . Levothyroxine   112 MCG tablet Take 1 tablet (112 mcg total) by mouth daily before breakfast.   Allergies  Allergen Reactions  . Codeine   . Ppd [Tuberculin Purified Protein Derivative]     Positive reaction 2001   PMHx:   Past Medical History  Diagnosis Date  . Hypothyroidism   . Hypertension   . High cholesterol    Immunization History  Administered Date(s) Administered  . Td 06/29/2001, 07/31/2012   Past Surgical History  Procedure Laterality Date  . Lymph node dissection      NECK- BENIGN   FHx:    Reviewed / unchanged  SHx:    Reviewed / unchanged   Systems Review:  Constitutional: Denies fever, chills, wt changes, headaches, insomnia, fatigue, night sweats, change in appetite. Eyes: Denies redness, blurred vision, diplopia, discharge, itchy, watery eyes.  ENT: Denies discharge, congestion, post nasal drip, epistaxis, sore throat, earache, hearing loss, dental pain, tinnitus, vertigo, sinus pain, snoring.  CV: Denies chest pain, palpitations, irregular heartbeat, syncope, dyspnea, diaphoresis, orthopnea, PND, claudication or edema. Respiratory: denies cough, dyspnea, DOE, pleurisy, hoarseness, laryngitis, wheezing.  Gastrointestinal: Denies dysphagia, odynophagia, heartburn, reflux, water brash, abdominal pain or cramps, nausea, vomiting, bloating, diarrhea, constipation, hematemesis, melena, hematochezia  or hemorrhoids. Genitourinary: Denies dysuria, frequency, urgency, nocturia, hesitancy, discharge, hematuria or flank pain. Musculoskeletal: Denies arthralgias, myalgias, stiffness, jt. swelling, pain, limping or strain/sprain.  Skin: Denies pruritus, rash, hives, warts, acne, eczema or change in skin lesion(s). Neuro: No weakness, tremor, incoordination, spasms, paresthesia or pain. Psychiatric: Denies confusion, memory loss or sensory loss. Endo: Denies change in weight, skin or hair change.  Heme/Lymph: No excessive bleeding, bruising or enlarged lymph nodes.  Physical Exam  BP 142/84   Pulse 60  Temp 97.5 F  Resp 16  Ht 5\' 2"    Wt 169 lb      BMI 30.94   Appears well nourished and in no distress. Eyes: PERRLA, EOMs, conjunctiva no swelling or erythema. Sinuses: No frontal/maxillary tenderness ENT/Mouth: EAC's clear, TM's nl w/o erythema, bulging. Nares clear w/o erythema, swelling, exudates. Oropharynx clear without erythema or  exudates. Oral hygiene is good. Tongue normal, non obstructing. Hearing intact.  Neck: Supple. Thyroid nl. Car 2+/2+ without bruits, nodes or JVD. Chest: Respirations nl with BS clear & equal w/o  rales, rhonchi, wheezing or stridor.  Cor: Heart sounds normal w/ regular rate and rhythm without sig. murmurs, gallops, clicks, or rubs. Peripheral pulses normal and equal  without edema.  Abdomen: Soft & bowel sounds normal. Non-tender w/o guarding, rebound, hernias, masses, or organomegaly.  Lymphatics: Unremarkable.  Musculoskeletal: Full ROM all peripheral extremities, joint stability, 5/5 strength, and normal gait.  Skin: Warm, dry without exposed rashes, lesions or ecchymosis apparent.  Neuro: Cranial nerves intact, reflexes equal bilaterally. Sensory-motor testing grossly intact. Tendon reflexes grossly intact.  Pysch: Alert & oriented x 3.  Insight and judgement nl & appropriate. No ideations.  Assessment and Plan:  1. Essential hypertension  - TSH  2. Hyperlipidemia  - Lipid panel  3. Prediabetes  - Hemoglobin A1c - Insulin, random  4. Vitamin D deficiency  - Vit D  25 hydroxy (rtn osteoporosis monitoring)  5. Hypothyroidism, unspecified hypothyroidism type   6. Medication management  - CBC with Differential/Platelet - BASIC METABOLIC PANEL WITH GFR - Hepatic function panel - Magnesium   Recommended regular exercise, BP monitoring, weight control, and discussed med and SE's. Recommended labs to assess and monitor clinical status. Further disposition pending results of labs. Over 30 minutes of exam, counseling, chart review was performed

## 2014-10-30 NOTE — Patient Instructions (Signed)

## 2014-10-31 LAB — INSULIN, RANDOM: Insulin: 30.9 u[IU]/mL — ABNORMAL HIGH (ref 2.0–19.6)

## 2014-12-18 ENCOUNTER — Other Ambulatory Visit: Payer: Self-pay | Admitting: Emergency Medicine

## 2015-01-24 ENCOUNTER — Other Ambulatory Visit: Payer: Self-pay | Admitting: Internal Medicine

## 2015-03-04 ENCOUNTER — Other Ambulatory Visit: Payer: Self-pay

## 2015-03-04 DIAGNOSIS — Z1231 Encounter for screening mammogram for malignant neoplasm of breast: Secondary | ICD-10-CM

## 2015-03-23 ENCOUNTER — Ambulatory Visit (INDEPENDENT_AMBULATORY_CARE_PROVIDER_SITE_OTHER): Payer: No Typology Code available for payment source | Admitting: Women's Health

## 2015-03-23 ENCOUNTER — Other Ambulatory Visit (HOSPITAL_COMMUNITY)
Admission: RE | Admit: 2015-03-23 | Discharge: 2015-03-23 | Disposition: A | Discharge status: Home / Self Care | Payer: PRIVATE HEALTH INSURANCE | Source: Ambulatory Visit | Attending: Women's Health | Admitting: Women's Health

## 2015-03-23 ENCOUNTER — Encounter: Payer: Self-pay | Admitting: Women's Health

## 2015-03-23 VITALS — BP 132/80 | Ht 62.0 in | Wt 169.0 lb

## 2015-03-23 DIAGNOSIS — Z01419 Encounter for gynecological examination (general) (routine) without abnormal findings: Secondary | ICD-10-CM | POA: Diagnosis not present

## 2015-03-23 DIAGNOSIS — Z1151 Encounter for screening for human papillomavirus (HPV): Secondary | ICD-10-CM | POA: Diagnosis present

## 2015-03-23 NOTE — Patient Instructions (Signed)
Menopause is a normal process in which your reproductive ability comes to an end. This process happens gradually over a span of months to years, usually between the ages of 48 and 55. Menopause is complete when you have missed 12 consecutive menstrual periods. It is important to talk with your health care provider about some of the most common conditions that affect postmenopausal women, such as heart disease, cancer, and bone loss (osteoporosis). Adopting a healthy lifestyle and getting preventive care can help to promote your health and wellness. Those actions can also lower your chances of developing some of these common conditions. WHAT SHOULD I KNOW ABOUT MENOPAUSE? During menopause, you may experience a number of symptoms, such as:  Moderate-to-severe hot flashes.  Night sweats.  Decrease in sex drive.  Mood swings.  Headaches.  Tiredness.  Irritability.  Memory problems.  Insomnia. Choosing to treat or not to treat menopausal changes is an individual decision that you make with your health care provider. WHAT SHOULD I KNOW ABOUT HORMONE REPLACEMENT THERAPY AND SUPPLEMENTS? Hormone therapy products are effective for treating symptoms that are associated with menopause, such as hot flashes and night sweats. Hormone replacement carries certain risks, especially as you become older. If you are thinking about using estrogen or estrogen with progestin treatments, discuss the benefits and risks with your health care provider. WHAT SHOULD I KNOW ABOUT HEART DISEASE AND STROKE? Heart disease, heart attack, and stroke become more likely as you age. This may be due, in part, to the hormonal changes that your body experiences during menopause. These can affect how your body processes dietary fats, triglycerides, and cholesterol. Heart attack and stroke are both medical emergencies. There are many things that you can do to help prevent heart disease and stroke:  Have your blood pressure  checked at least every 1-2 years. High blood pressure causes heart disease and increases the risk of stroke.  If you are 55-79 years old, ask your health care provider if you should take aspirin to prevent a heart attack or a stroke.  Do not use any tobacco products, including cigarettes, chewing tobacco, or electronic cigarettes. If you need help quitting, ask your health care provider.  It is important to eat a healthy diet and maintain a healthy weight.  Be sure to include plenty of vegetables, fruits, low-fat dairy products, and lean protein.  Avoid eating foods that are high in solid fats, added sugars, or salt (sodium).  Get regular exercise. This is one of the most important things that you can do for your health.  Try to exercise for at least 150 minutes each week. The type of exercise that you do should increase your heart rate and make you sweat. This is known as moderate-intensity exercise.  Try to do strengthening exercises at least twice each week. Do these in addition to the moderate-intensity exercise.  Know your numbers.Ask your health care provider to check your cholesterol and your blood glucose. Continue to have your blood tested as directed by your health care provider. WHAT SHOULD I KNOW ABOUT CANCER SCREENING? There are several types of cancer. Take the following steps to reduce your risk and to catch any cancer development as early as possible. Breast Cancer  Practice breast self-awareness.  This means understanding how your breasts normally appear and feel.  It also means doing regular breast self-exams. Let your health care provider know about any changes, no matter how small.  If you are 40 or older, have a clinician do a   breast exam (clinical breast exam or CBE) every year. Depending on your age, family history, and medical history, it may be recommended that you also have a yearly breast X-ray (mammogram).  If you have a family history of breast cancer,  talk with your health care provider about genetic screening.  If you are at high risk for breast cancer, talk with your health care provider about having an MRI and a mammogram every year.  Breast cancer (BRCA) gene test is recommended for women who have family members with BRCA-related cancers. Results of the assessment will determine the need for genetic counseling and BRCA1 and for BRCA2 testing. BRCA-related cancers include these types:  Breast. This occurs in males or females.  Ovarian.  Tubal. This may also be called fallopian tube cancer.  Cancer of the abdominal or pelvic lining (peritoneal cancer).  Prostate.  Pancreatic. Cervical, Uterine, and Ovarian Cancer Your health care provider may recommend that you be screened regularly for cancer of the pelvic organs. These include your ovaries, uterus, and vagina. This screening involves a pelvic exam, which includes checking for microscopic changes to the surface of your cervix (Pap test).  For women ages 21-65, health care providers may recommend a pelvic exam and a Pap test every three years. For women ages 77-65, they may recommend the Pap test and pelvic exam, combined with testing for human papilloma virus (HPV), every five years. Some types of HPV increase your risk of cervical cancer. Testing for HPV may also be done on women of any age who have unclear Pap test results.  Other health care providers may not recommend any screening for nonpregnant women who are considered low risk for pelvic cancer and have no symptoms. Ask your health care provider if a screening pelvic exam is right for you.  If you have had past treatment for cervical cancer or a condition that could lead to cancer, you need Pap tests and screening for cancer for at least 20 years after your treatment. If Pap tests have been discontinued for you, your risk factors (such as having a new sexual partner) need to be reassessed to determine if you should start having  screenings again. Some women have medical problems that increase the chance of getting cervical cancer. In these cases, your health care provider may recommend that you have screening and Pap tests more often.  If you have a family history of uterine cancer or ovarian cancer, talk with your health care provider about genetic screening.  If you have vaginal bleeding after reaching menopause, tell your health care provider.  There are currently no reliable tests available to screen for ovarian cancer. Lung Cancer Lung cancer screening is recommended for adults 3-70 years old who are at high risk for lung cancer because of a history of smoking. A yearly low-dose CT scan of the lungs is recommended if you:  Currently smoke.  Have a history of at least 30 pack-years of smoking and you currently smoke or have quit within the past 15 years. A pack-year is smoking an average of one pack of cigarettes per day for one year. Yearly screening should:  Continue until it has been 15 years since you quit.  Stop if you develop a health problem that would prevent you from having lung cancer treatment. Colorectal Cancer  This type of cancer can be detected and can often be prevented.  Routine colorectal cancer screening usually begins at age 38 and continues through age 12.  If you have  risk factors for colon cancer, your health care provider may recommend that you be screened at an earlier age.  If you have a family history of colorectal cancer, talk with your health care provider about genetic screening.  Your health care provider may also recommend using home test kits to check for hidden blood in your stool.  A small camera at the end of a tube can be used to examine your colon directly (sigmoidoscopy or colonoscopy). This is done to check for the earliest forms of colorectal cancer.  Direct examination of the colon should be repeated every 5-10 years until age 67. However, if early forms of  precancerous polyps or small growths are found or if you have a family history or genetic risk for colorectal cancer, you may need to be screened more often. Skin Cancer  Check your skin from head to toe regularly.  Monitor any moles. Be sure to tell your health care provider:  About any new moles or changes in moles, especially if there is a change in a mole's shape or color.  If you have a mole that is larger than the size of a pencil eraser.  If any of your family members has a history of skin cancer, especially at a young age, talk with your health care provider about genetic screening.  Always use sunscreen. Apply sunscreen liberally and repeatedly throughout the day.  Whenever you are outside, protect yourself by wearing long sleeves, pants, a wide-brimmed hat, and sunglasses. WHAT SHOULD I KNOW ABOUT OSTEOPOROSIS? Osteoporosis is a condition in which bone destruction happens more quickly than new bone creation. After menopause, you may be at an increased risk for osteoporosis. To help prevent osteoporosis or the bone fractures that can happen because of osteoporosis, the following is recommended:  If you are 39-61 years old, get at least 1,000 mg of calcium and at least 600 mg of vitamin D per day.  If you are older than age 16 but younger than age 7, get at least 1,200 mg of calcium and at least 600 mg of vitamin D per day.  If you are older than age 47, get at least 1,200 mg of calcium and at least 800 mg of vitamin D per day. Smoking and excessive alcohol intake increase the risk of osteoporosis. Eat foods that are rich in calcium and vitamin D, and do weight-bearing exercises several times each week as directed by your health care provider. WHAT SHOULD I KNOW ABOUT HOW MENOPAUSE AFFECTS Allison Contreras? Depression may occur at any age, but it is more common as you become older. Common symptoms of depression include:  Low or sad mood.  Changes in sleep patterns.  Changes  in appetite or eating patterns.  Feeling an overall lack of motivation or enjoyment of activities that you previously enjoyed.  Frequent crying spells. Talk with your health care provider if you think that you are experiencing depression. WHAT SHOULD I KNOW ABOUT IMMUNIZATIONS? It is important that you get and maintain your immunizations. These include:  Tetanus, diphtheria, and pertussis (Tdap) booster vaccine.  Influenza every year before the flu season begins.  Pneumonia vaccine.  Shingles vaccine. Your health care provider may also recommend other immunizations.   This information is not intended to replace advice given to you by your health care provider. Make sure you discuss any questions you have with your health care provider.   Document Released: 06/23/2005 Document Revised: 05/22/2014 Document Reviewed: 01/01/2014 Elsevier Interactive Patient Education 2016 [employer] Diabetes Mellitus and Food It is important for you to manage your blood sugar (glucose) level. Your blood glucose level can be greatly affected by what you eat. Eating healthier foods in the appropriate amounts throughout the day at about the same time each day will help you control your blood glucose level. It can also help slow or prevent worsening of your diabetes mellitus. Healthy eating may even help you improve the level of your blood pressure and reach or maintain a healthy weight.  General recommendations for healthful eating and cooking habits include:  Eating meals and snacks regularly. Avoid going long periods of time without eating to lose weight.  Eating a diet that consists mainly of plant-based foods, such as fruits, vegetables, nuts, legumes, and whole grains.  Using low-heat cooking methods, such as baking, instead of high-heat cooking methods, such as deep frying. Work with your dietitian to make sure you understand how to use the Nutrition Facts information on food labels. HOW CAN FOOD  AFFECT ME? Carbohydrates Carbohydrates affect your blood glucose level more than any other type of food. Your dietitian will help you determine how many carbohydrates to eat at each meal and teach you how to count carbohydrates. Counting carbohydrates is important to keep your blood glucose at a healthy level, especially if you are using insulin or taking certain medicines for diabetes mellitus. Alcohol Alcohol can cause sudden decreases in blood glucose (hypoglycemia), especially if you use insulin or take certain medicines for diabetes mellitus. Hypoglycemia can be a life-threatening condition. Symptoms of hypoglycemia (sleepiness, dizziness, and disorientation) are similar to symptoms of having too much alcohol.  If your health care provider has given you approval to drink alcohol, do so in moderation and use the following guidelines:  Women should not have more than one drink per day, and men should not have more than two drinks per day. One drink is equal to:  12 oz of beer.  5 oz of wine.  1 oz of hard liquor.  Do not drink on an empty stomach.  Keep yourself hydrated. Have water, diet soda, or unsweetened iced tea.  Regular soda, juice, and other mixers might contain a lot of carbohydrates and should be counted. WHAT FOODS ARE NOT RECOMMENDED? As you make food choices, it is important to remember that all foods are not the same. Some foods have fewer nutrients per serving than other foods, even though they might have the same number of calories or carbohydrates. It is difficult to get your body what it needs when you eat foods with fewer nutrients. Examples of foods that you should avoid that are high in calories and carbohydrates but low in nutrients include:  Trans fats (most processed foods list trans fats on the Nutrition Facts label).  Regular soda.  Juice.  Candy.  Sweets, such as cake, pie, doughnuts, and cookies.  Fried foods. WHAT FOODS CAN I EAT? Eat nutrient-rich  foods, which will nourish your body and keep you healthy. The food you should eat also will depend on several factors, including:  The calories you need.  The medicines you take.  Your weight.  Your blood glucose level.  Your blood pressure level.  Your cholesterol level. You should eat a variety of foods, including:  Protein.  Lean cuts of meat.  Proteins low in saturated fats, such as fish, egg whites, and beans. Avoid processed meats.  Fruits and vegetables.  Fruits and vegetables that may help control blood glucose levels, such as apples,   mangoes, and yams.  Dairy products.  Choose fat-free or low-fat dairy products, such as milk, yogurt, and cheese.  Grains, bread, pasta, and rice.  Choose whole grain products, such as multigrain bread, whole oats, and brown rice. These foods may help control blood pressure.  Fats.  Foods containing healthful fats, such as nuts, avocado, olive oil, canola oil, and fish. DOES EVERYONE WITH DIABETES MELLITUS HAVE THE SAME MEAL PLAN? Because every person with diabetes mellitus is different, there is not one meal plan that works for everyone. It is very important that you meet with a dietitian who will help you create a meal plan that is just right for you.   This information is not intended to replace advice given to you by your health care provider. Make sure you discuss any questions you have with your health care provider.   Document Released: 01/26/2005 Document Revised: 05/22/2014 Document Reviewed: 03/28/2013 Elsevier Interactive Patient Education 2016 [employer] Inc[employer]  

## 2015-03-23 NOTE — Progress Notes (Signed)
Allison BonesSharon Contreras 03/07/1966 [id number]    History:    Presents for annual exam.   Cycles every 1-2 months/withdrawal. Hypothyroid/hypercholesterolemia /hypertension,  prediabetes primary care manages. 2012 ASCUS with negative HR HPV.  Past medical history, past surgical history, family history and social history were all reviewed and documented in the EPIC chart. 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 Allison DecSara 17,  son [id number] both doing well. Mother diabetes died of lymphoma. Father hypertension died of heart disease.  ROS:  A ROS was performed and pertinent positives and negatives are included.  Exam:  Filed Vitals:   03/23/15 1023  BP: 132/80    General appearance:  Normal Thyroid:  Symmetrical, normal in size, without palpable masses or nodularity. Respiratory  Auscultation:  Clear without wheezing or rhonchi Cardiovascular  Auscultation:  Regular rate, without rubs, murmurs or gallops  Edema/varicosities:  Not grossly evident Abdominal  Soft,nontender, without masses, guarding or rebound.  Liver/spleen:  No organomegaly noted  Hernia:  None appreciated  Skin  Inspection:  Grossly normal   Breasts: Examined lying and sitting.     Right: Without masses, retractions, discharge or axillary adenopathy.     Left: Without masses, retractions, discharge or axillary adenopathy. Gentitourinary   Inguinal/mons:  Normal without inguinal adenopathy  External genitalia:  Normal  BUS/Urethra/Skene's glands:  Normal  Vagina:  Normal  Cervix:  Normal  Uterus: normal in size, shape and contour.  Midline and mobile  Adnexa/parametria:     Rt: Without masses or tenderness.   Lt: Without masses or tenderness.  Anus and perineum: Normal  Digital rectal exam: Normal sphincter tone without palpated masses or tenderness  Assessment/Plan:  49 y.o.  MWF G2 P2 for annual exam.    Cycles every 1-2 months/withdrawal Hypertension/ hypothyroid -primary care managing labs and  meds Triglycerides 900 reluctant to take medication as recommended by primary care  Prediabetes- hemoglobin A1c 6.1 primary care   plan: SBE's, continue annual screening mammogram, calcium rich diet, vitamin D 2000 daily encouraged. Reviewed importance of increasing exercise and decreasing simple sugars, carbs and diet. Reviewed importance of weight loss for health. Reviewed importance of recommendations at primary care for medication for extremely high triglycerides. Reviewed most likely genetic tendency also , father died in his 5850s from heart disease. Contraception options reviewed and declined will continue with withdrawal. Pap with HR HPV typing, new screening guidelines reviewed. UHarrington Challenger.     Allison Contreras J WHNP, 4:59 PM 03/23/2015

## 2015-03-24 LAB — URINALYSIS W MICROSCOPIC + REFLEX CULTURE
Bacteria, UA: NONE SEEN [HPF]
Bilirubin Urine: NEGATIVE
Casts: NONE SEEN [LPF]
Crystals: NONE SEEN [HPF]
Glucose, UA: NEGATIVE
Hgb urine dipstick: NEGATIVE
Ketones, ur: NEGATIVE
Leukocytes, UA: NEGATIVE
Nitrite: NEGATIVE
Protein, ur: NEGATIVE
RBC / HPF: NONE SEEN RBC/HPF (ref <=2)
Specific Gravity, Urine: 1.003 (ref 1.001–1.035)
Squamous Epithelial / LPF: NONE SEEN [HPF] (ref <=5)
WBC, UA: NONE SEEN WBC/HPF (ref <=5)
Yeast: NONE SEEN [HPF]
pH: 5.5 (ref 5.0–8.0)

## 2015-03-26 LAB — CYTOLOGY - PAP

## 2015-04-06 ENCOUNTER — Encounter: Payer: Self-pay | Admitting: Internal Medicine

## 2015-04-07 ENCOUNTER — Ambulatory Visit: Payer: Self-pay

## 2015-04-12 ENCOUNTER — Ambulatory Visit
Admission: RE | Admit: 2015-04-12 | Discharge: 2015-04-12 | Disposition: A | Discharge status: Home / Self Care | Payer: PRIVATE HEALTH INSURANCE | Source: Ambulatory Visit

## 2015-04-12 ENCOUNTER — Encounter: Payer: Self-pay | Admitting: Women's Health

## 2015-04-12 DIAGNOSIS — Z1231 Encounter for screening mammogram for malignant neoplasm of breast: Secondary | ICD-10-CM

## 2015-05-13 ENCOUNTER — Encounter: Payer: Self-pay | Admitting: Internal Medicine

## 2015-05-13 ENCOUNTER — Ambulatory Visit (INDEPENDENT_AMBULATORY_CARE_PROVIDER_SITE_OTHER): Payer: No Typology Code available for payment source | Admitting: Internal Medicine

## 2015-05-13 VITALS — BP 144/86 | HR 72 | Temp 97.4°F | Resp 16 | Ht 62.0 in | Wt 168.2 lb

## 2015-05-13 DIAGNOSIS — E559 Vitamin D deficiency, unspecified: Secondary | ICD-10-CM

## 2015-05-13 DIAGNOSIS — E039 Hypothyroidism, unspecified: Secondary | ICD-10-CM

## 2015-05-13 DIAGNOSIS — Z0001 Encounter for general adult medical examination with abnormal findings: Secondary | ICD-10-CM

## 2015-05-13 DIAGNOSIS — R5383 Other fatigue: Secondary | ICD-10-CM

## 2015-05-13 DIAGNOSIS — Z0000 Encounter for general adult medical examination without abnormal findings: Secondary | ICD-10-CM | POA: Diagnosis not present

## 2015-05-13 DIAGNOSIS — R7303 Prediabetes: Secondary | ICD-10-CM

## 2015-05-13 DIAGNOSIS — Z79899 Other long term (current) drug therapy: Secondary | ICD-10-CM

## 2015-05-13 DIAGNOSIS — Z1212 Encounter for screening for malignant neoplasm of rectum: Secondary | ICD-10-CM

## 2015-05-13 DIAGNOSIS — I10 Essential (primary) hypertension: Secondary | ICD-10-CM | POA: Diagnosis not present

## 2015-05-13 DIAGNOSIS — E785 Hyperlipidemia, unspecified: Secondary | ICD-10-CM

## 2015-05-13 LAB — CBC WITH DIFFERENTIAL/PLATELET
Basophils Absolute: 0 10*3/uL (ref 0.0–0.1)
Basophils Relative: 0 % (ref 0–1)
Eosinophils Absolute: 0.2 10*3/uL (ref 0.0–0.7)
Eosinophils Relative: 2 % (ref 0–5)
HCT: 41.5 % (ref 36.0–46.0)
Hemoglobin: 14.1 g/dL (ref 12.0–15.0)
Lymphocytes Relative: 27 % (ref 12–46)
Lymphs Abs: 2.5 10*3/uL (ref 0.7–4.0)
MCH: 28.7 pg (ref 26.0–34.0)
MCHC: 34 g/dL (ref 30.0–36.0)
MCV: 84.3 fL (ref 78.0–100.0)
MPV: 9.5 fL (ref 8.6–12.4)
Monocytes Absolute: 0.7 10*3/uL (ref 0.1–1.0)
Monocytes Relative: 7 % (ref 3–12)
Neutro Abs: 6 10*3/uL (ref 1.7–7.7)
Neutrophils Relative %: 64 % (ref 43–77)
Platelets: 307 10*3/uL (ref 150–400)
RBC: 4.92 MIL/uL (ref 3.87–5.11)
RDW: 15.3 % (ref 11.5–15.5)
WBC: 9.3 10*3/uL (ref 4.0–10.5)

## 2015-05-13 NOTE — Patient Instructions (Signed)
Recommend Adult Low Dose Aspirin or   coated  Aspirin 81 mg daily   To reduce risk of Colon Cancer 20 %,   Skin Cancer 26 % ,   Melanoma 46%   and   Pancreatic cancer 60%   ++++++++++++++++++++++++++++++++++++++++++++++++++++++  Vitamin D goal   is between 70-100.   Please make sure that you are taking your Vitamin D as directed.   It is very important as a natural anti-inflammatory   helping hair, skin, and nails, as well as reducing stroke and heart attack risk.   It helps your bones and helps with mood.  It also decreases numerous cancer risks so please take it as directed.   Low Vit D is associated with a 200-300% higher risk for CANCER   and 200-300% higher risk for HEART   ATTACK  &  STROKE.   ......................................  It is also associated with higher death rate at younger ages,   autoimmune diseases like Rheumatoid arthritis, Lupus, Multiple Sclerosis.     Also many other serious conditions, like depression, Alzheimer's  Dementia, infertility, muscle aches, fatigue, fibromyalgia - just to name a few.  ++++++++++++++++++++++++++++++++++++++++++++++++  Recommend the book "The END of DIETING" by Dr Joel Fuhrman   & the book "The END of DIABETES " by Dr Joel Fuhrman  At [employer] - get book & Audio CD's     Being diabetic has a  300% increased risk for heart attack, stroke, cancer, and alzheimer- type vascular dementia. It is very important that you work harder with diet by avoiding all foods that are white. Avoid white rice (brown & wild rice is OK), white potatoes (sweetpotatoes in moderation is OK), White bread or wheat bread or anything made out of white flour like bagels, donuts, rolls, buns, biscuits, cakes, pastries, cookies, pizza crust, and pasta (made from white flour & egg whites) - vegetarian pasta or spinach or wheat pasta is OK. Multigrain breads like Arnold's or [employer], or multigrain sandwich thins or flatbreads.  Diet,  exercise and weight loss can reverse and cure diabetes in the early stages.  Diet, exercise and weight loss is very important in the control and prevention of complications of diabetes which affects every system in your body, ie. Brain - dementia/stroke, eyes - glaucoma/blindness, heart - heart attack/heart failure, kidneys - dialysis, stomach - gastric paralysis, intestines - malabsorption, nerves - severe painful neuritis, circulation - gangrene & loss of a leg(s), and finally cancer and Alzheimers.    I recommend avoid fried & greasy foods,  sweets/candy, white rice (brown or wild rice or Quinoa is OK), white potatoes (sweet potatoes are OK) - anything made from white flour - bagels, doughnuts, rolls, buns, biscuits,white and wheat breads, pizza crust and traditional pasta made of white flour & egg white(vegetarian pasta or spinach or wheat pasta is OK).  Multi-grain bread is OK - like multi-grain flat bread or sandwich thins. Avoid alcohol in excess. Exercise is also important.    Eat all the vegetables you want - avoid meat, especially red meat and dairy - especially cheese.  Cheese is the most concentrated form of trans-fats which is the worst thing to clog up our arteries. Veggie cheese is OK which can be found in the fresh produce section at [employer] or [employer] or [employer]  ++++++++++++++++++++++++++++++++++++++++++++++++++ DASH Eating Plan  DASH stands for "Dietary Approaches to Stop Hypertension."   The DASH eating plan is a healthy eating plan that has been shown to reduce high   blood pressure (hypertension). Additional health benefits may include reducing the risk of type 2 diabetes mellitus, heart disease, and stroke. The DASH eating plan may also help with weight loss.  WHAT DO I NEED TO KNOW ABOUT THE DASH EATING PLAN? For the DASH eating plan, you will follow these general guidelines:  Choose foods with a percent daily value for sodium of less than 5% (as listed on the food  label).  Use salt-free seasonings or herbs instead of table salt or sea salt.  Check with your health care provider or pharmacist before using salt substitutes.  Eat lower-sodium products, often labeled as "lower sodium" or "no salt added."  Eat fresh foods.  Eat more vegetables, fruits, and low-fat dairy products.    Choose whole grains. Look for the word "whole" as the first word in the ingredient list.  Choose fish   Limit sweets, desserts, sugars, and sugary drinks.  Choose heart-healthy fats.  Eat veggie cheese   Eat more home-cooked food and less restaurant, buffet, and fast food.  Limit fried foods.  Cook foods using methods other than frying.  Limit canned vegetables. If you do use them, rinse them well to decrease the sodium.  When eating at a restaurant, ask that your food be prepared with less salt, or no salt if possible.                      WHAT FOODS CAN I EAT?  Seek help from a dietitian for individual calorie needs.  Grains Whole grain or whole wheat bread. Brown rice. Whole grain or whole wheat pasta. Quinoa, bulgur, and whole grain cereals. Low-sodium cereals. Corn or whole wheat flour tortillas. Whole grain cornbread. Whole grain crackers. Low-sodium crackers.  Vegetables Fresh or frozen vegetables (raw, steamed, roasted, or grilled). Low-sodium or reduced-sodium tomato and vegetable juices. Low-sodium or reduced-sodium tomato sauce and paste. Low-sodium or reduced-sodium canned vegetables.   Fruits All fresh, canned (in natural juice), or frozen fruits.  Protein Products  All fish and seafood.  Dried beans, peas, or lentils. Unsalted nuts and seeds. Unsalted canned beans.  Dairy Low-fat dairy products, such as skim or 1% milk, 2% or reduced-fat cheeses, low-fat ricotta or cottage cheese, or plain low-fat yogurt. Low-sodium or reduced-sodium cheeses.  Fats and Oils Tub margarines without trans fats. Light or reduced-fat mayonnaise and salad  dressings (reduced sodium). Avocado. Safflower, olive, or canola oils. Natural peanut or almond butter.  Other Unsalted popcorn and pretzels. The items listed above may not be a complete list of recommended foods or beverages. Contact your dietitian for more options.  +++++++++++++++++++++++++++++++++++++++++++  WHAT FOODS ARE NOT RECOMMENDED?  Grains/ White flour or wheat flour  White bread. White pasta. White rice. Refined cornbread. Bagels and croissants. Crackers that contain trans fat.  Vegetables  Creamed or fried vegetables. Vegetables in a . Regular canned vegetables. Regular canned tomato sauce and paste. Regular tomato and vegetable juices.  Fruits Dried fruits. Canned fruit in light or heavy syrup. Fruit juice.  Meat and Other Protein Products Meat in general. Fatty cuts of meat. Ribs, chicken wings, bacon, sausage, bologna, salami, chitterlings, fatback, hot dogs, bratwurst, and packaged luncheon meats.  Dairy Whole or 2% milk, cream, half-and-half, and cream cheese. Whole-fat or sweetened yogurt. Full-fat cheeses or blue cheese. Nondairy creamers and whipped toppings. Processed cheese, cheese spreads, or cheese curds.  Condiments Onion and garlic salt, seasoned salt, table salt, and sea salt. Canned and packaged gravies. Worcestershire sauce. Tartar sauce.  Barbecue sauce. Teriyaki sauce. Soy sauce, including reduced sodium. Steak sauce. Fish sauce. Oyster sauce. Cocktail sauce. Horseradish. Ketchup and mustard. Meat flavorings and tenderizers. Bouillon cubes. Hot sauce. Tabasco sauce. Marinades. Taco seasonings. Relishes.  Fats and Oils Butter, stick margarine, lard, shortening, ghee, and bacon fat. Coconut, palm kernel, or palm oils. Regular salad dressings.  Pickles and olives. Salted popcorn and pretzels. The items listed above may not be a complete list of foods and beverages to avoid.   Preventive Care for Adults  A healthy lifestyle and preventive care can  promote health and wellness. Preventive health guidelines for women include the following key practices.  A routine yearly physical is a good way to check with your health care provider about your health and preventive screening. It is a chance to share any concerns and updates on your health and to receive a thorough exam.  Visit your dentist for a routine exam and preventive care every 6 months. Brush your teeth twice a day and floss once a day. Good oral hygiene prevents tooth decay and gum disease.  The frequency of eye exams is based on your age, health, family medical history, use of contact lenses, and other factors. Follow your health care provider's recommendations for frequency of eye exams.  Eat a healthy diet. Foods like vegetables, fruits, whole grains, low-fat dairy products, and lean protein foods contain the nutrients you need without too many calories. Decrease your intake of foods high in solid fats, added sugars, and salt. Eat the right amount of calories for you.Get information about a proper diet from your health care provider, if necessary.  Regular physical exercise is one of the most important things you can do for your health. Most adults should get at least 150 minutes of moderate-intensity exercise (any activity that increases your heart rate and causes you to sweat) each week. In addition, most adults need muscle-strengthening exercises on 2 or more days a week.  Maintain a healthy weight. The body mass index (BMI) is a screening tool to identify possible weight problems. It provides an estimate of body fat based on height and weight. Your health care provider can find your BMI and can help you achieve or maintain a healthy weight.For adults 20 years and older:  A BMI below 18.5 is considered underweight.  A BMI of 18.5 to 24.9 is normal.  A BMI of 25 to 29.9 is considered overweight.  A BMI of 30 and above is considered obese.  Maintain normal blood lipids and  cholesterol levels by exercising and minimizing your intake of saturated fat. Eat a balanced diet with plenty of fruit and vegetables. Blood tests for lipids and cholesterol should begin at age 31 and be repeated every 5 years. If your lipid or cholesterol levels are high, you are over 50, or you are at high risk for heart disease, you may need your cholesterol levels checked more frequently.Ongoing high lipid and cholesterol levels should be treated with medicines if diet and exercise are not working.  If you smoke, find out from your health care provider how to quit. If you do not use tobacco, do not start.  Lung cancer screening is recommended for adults aged 83-80 years who are at high risk for developing lung cancer because of a history of smoking. A yearly low-dose CT scan of the lungs is recommended for people who have at least a 30-pack-year history of smoking and are a current smoker or have quit within the past 15 years.  A pack year of smoking is smoking an average of 1 pack of cigarettes a day for 1 year (for example: 1 pack a day for 30 years or 2 packs a day for 15 years). Yearly screening should continue until the smoker has stopped smoking for at least 15 years. Yearly screening should be stopped for people who develop a health problem that would prevent them from having lung cancer treatment.  High blood pressure causes heart disease and increases the risk of stroke. Your blood pressure should be checked at least every 1 to 2 years. Ongoing high blood pressure should be treated with medicines if weight loss and exercise do not work.  If you are 61-30 years old, ask your health care provider if you should take aspirin to prevent strokes.  Diabetes screening involves taking a blood sample to check your fasting blood sugar level. This should be done once every 3 years, after age 9, if you are within normal weight and without risk factors for diabetes. Testing should be considered at a  younger age or be carried out more frequently if you are overweight and have at least 1 risk factor for diabetes.  Breast cancer screening is essential preventive care for women. You should practice "breast self-awareness." This means understanding the normal appearance and feel of your breasts and may include breast self-examination. Any changes detected, no matter how small, should be reported to a health care provider. Women in their 74s and 30s should have a clinical breast exam (CBE) by a health care provider as part of a regular health exam every 1 to 3 years. After age 75, women should have a CBE every year. Starting at age 19, women should consider having a mammogram (breast X-ray test) every year. Women who have a family history of breast cancer should talk to their health care provider about genetic screening. Women at a high risk of breast cancer should talk to their health care providers about having an MRI and a mammogram every year.  Breast cancer gene (BRCA)-related cancer risk assessment is recommended for women who have family members with BRCA-related cancers. BRCA-related cancers include breast, ovarian, tubal, and peritoneal cancers. Having family members with these cancers may be associated with an increased risk for harmful changes (mutations) in the breast cancer genes BRCA1 and BRCA2. Results of the assessment will determine the need for genetic counseling and BRCA1 and BRCA2 testing.  Routine pelvic exams to screen for cancer are no longer recommended for nonpregnant women who are considered low risk for cancer of the pelvic organs (ovaries, uterus, and vagina) and who do not have symptoms. Ask your health care provider if a screening pelvic exam is right for you.  If you have had past treatment for cervical cancer or a condition that could lead to cancer, you need Pap tests and screening for cancer for at least 20 years after your treatment. If Pap tests have been discontinued, your  risk factors (such as having a new sexual partner) need to be reassessed to determine if screening should be resumed. Some women have medical problems that increase the chance of getting cervical cancer. In these cases, your health care provider may recommend more frequent screening and Pap tests.  Colorectal cancer can be detected and often prevented. Most routine colorectal cancer screening begins at the age of 23 years and continues through age 42 years. However, your health care provider may recommend screening at an earlier age if you have risk factors for colon  cancer. On a yearly basis, your health care provider may provide home test kits to check for hidden blood in the stool. Use of a small camera at the end of a tube, to directly examine the colon (sigmoidoscopy or colonoscopy), can detect the earliest forms of colorectal cancer. Talk to your health care provider about this at age 50, when routine screening begins. Direct exam of the colon should be repeated every 5-10 years through age 75 years, unless early forms of pre-cancerous polyps or small growths are found.  Hepatitis C blood testing is recommended for all people born from 1945 through 1965 and any individual with known risks for hepatitis C.  Pra  Osteoporosis is a disease in which the bones lose minerals and strength with aging. This can result in serious bone fractures or breaks. The risk of osteoporosis can be identified using a bone density scan. Women ages 65 years and over and women at risk for fractures or osteoporosis should discuss screening with their health care providers. Ask your health care provider whether you should take a calcium supplement or vitamin D to reduce the rate of osteoporosis.  Menopause can be associated with physical symptoms and risks. Hormone replacement therapy is available to decrease symptoms and risks. You should talk to your health care provider about whether hormone replacement therapy is right  for you.  Use sunscreen. Apply sunscreen liberally and repeatedly throughout the day. You should seek shade when your shadow is shorter than you. Protect yourself by wearing long sleeves, pants, a wide-brimmed hat, and sunglasses year round, whenever you are outdoors.  Once a month, do a whole body skin exam, using a mirror to look at the skin on your back. Tell your health care provider of new moles, moles that have irregular borders, moles that are larger than a pencil eraser, or moles that have changed in shape or color.  Stay current with required vaccines (immunizations).  Influenza vaccine. All adults should be immunized every year.  Tetanus, diphtheria, and acellular pertussis (Td, Tdap) vaccine. Pregnant women should receive 1 dose of Tdap vaccine during each pregnancy. The dose should be obtained regardless of the length of time since the last dose. Immunization is preferred during the 27th-36th week of gestation. An adult who has not previously received Tdap or who does not know her vaccine status should receive 1 dose of Tdap. This initial dose should be followed by tetanus and diphtheria toxoids (Td) booster doses every 10 years. Adults with an unknown or incomplete history of completing a 3-dose immunization series with Td-containing vaccines should begin or complete a primary immunization series including a Tdap dose. Adults should receive a Td booster every 10 years.  Varicella vaccine. An adult without evidence of immunity to varicella should receive 2 doses or a second dose if she has previously received 1 dose. Pregnant females who do not have evidence of immunity should receive the first dose after pregnancy. This first dose should be obtained before leaving the health care facility. The second dose should be obtained 4-8 weeks after the first dose.  Human papillomavirus (HPV) vaccine. Females aged 13-26 years who have not received the vaccine previously should obtain the 3-dose  series. The vaccine is not recommended for use in pregnant females. However, pregnancy testing is not needed before receiving a dose. If a female is found to be pregnant after receiving a dose, no treatment is needed. In that case, the remaining doses should be delayed until after the pregnancy. Immunization   is recommended for any person with an immunocompromised condition through the age of 26 years if she did not get any or all doses earlier. During the 3-dose series, the second dose should be obtained 4-8 weeks after the first dose. The third dose should be obtained 24 weeks after the first dose and 16 weeks after the second dose.  Zoster vaccine. One dose is recommended for adults aged 60 years or older unless certain conditions are present.  Measles, mumps, and rubella (MMR) vaccine. Adults born before 1957 generally are considered immune to measles and mumps. Adults born in 1957 or later should have 1 or more doses of MMR vaccine unless there is a contraindication to the vaccine or there is laboratory evidence of immunity to each of the three diseases. A routine second dose of MMR vaccine should be obtained at least 28 days after the first dose for students attending postsecondary schools, health care workers, or international travelers. People who received inactivated measles vaccine or an unknown type of measles vaccine during 1963-1967 should receive 2 doses of MMR vaccine. People who received inactivated mumps vaccine or an unknown type of mumps vaccine before 1979 and are at high risk for mumps infection should consider immunization with 2 doses of MMR vaccine. For females of childbearing age, rubella immunity should be determined. If there is no evidence of immunity, females who are not pregnant should be vaccinated. If there is no evidence of immunity, females who are pregnant should delay immunization until after pregnancy. Unvaccinated health care workers born before 1957 who lack laboratory  evidence of measles, mumps, or rubella immunity or laboratory confirmation of disease should consider measles and mumps immunization with 2 doses of MMR vaccine or rubella immunization with 1 dose of MMR vaccine.  Pneumococcal 13-valent conjugate (PCV13) vaccine. When indicated, a person who is uncertain of her immunization history and has no record of immunization should receive the PCV13 vaccine. An adult aged 19 years or older who has certain medical conditions and has not been previously immunized should receive 1 dose of PCV13 vaccine. This PCV13 should be followed with a dose of pneumococcal polysaccharide (PPSV23) vaccine. The PPSV23 vaccine dose should be obtained at least 8 weeks after the dose of PCV13 vaccine. An adult aged 19 years or older who has certain medical conditions and previously received 1 or more doses of PPSV23 vaccine should receive 1 dose of PCV13. The PCV13 vaccine dose should be obtained 1 or more years after the last PPSV23 vaccine dose.    Pneumococcal polysaccharide (PPSV23) vaccine. When PCV13 is also indicated, PCV13 should be obtained first. All adults aged 65 years and older should be immunized. An adult younger than age 65 years who has certain medical conditions should be immunized. Any person who resides in a nursing home or long-term care facility should be immunized. An adult smoker should be immunized. People with an immunocompromised condition and certain other conditions should receive both PCV13 and PPSV23 vaccines. People with human immunodeficiency virus (HIV) infection should be immunized as soon as possible after diagnosis. Immunization during chemotherapy or radiation therapy should be avoided. Routine use of PPSV23 vaccine is not recommended for American Indians, [place] Natives, or people younger than 65 years unless there are medical conditions that require PPSV23 vaccine. When indicated, people who have unknown immunization and have no record of immunization  should receive PPSV23 vaccine. One-time revaccination 5 years after the first dose of PPSV23 is recommended for people aged 19-64 years who have   chronic kidney failure, nephrotic syndrome, asplenia, or immunocompromised conditions. People who received 1-2 doses of PPSV23 before age 28 years should receive another dose of PPSV23 vaccine at age 64 years or later if at least 5 years have passed since the previous dose. Doses of PPSV23 are not needed for people immunized with PPSV23 at or after age 65 years.  Preventive Services / Frequency   Ages 52 to 76 years  Blood pressure check.  Lipid and cholesterol check.  Lung cancer screening. / Every year if you are aged 62-80 years and have a 30-pack-year history of smoking and currently smoke or have quit within the past 15 years. Yearly screening is stopped once you have quit smoking for at least 15 years or develop a health problem that would prevent you from having lung cancer treatment.  Clinical breast exam.** / Every year after age 38 years.  BRCA-related cancer risk assessment.** / For women who have family members with a BRCA-related cancer (breast, ovarian, tubal, or peritoneal cancers).  Mammogram.** / Every year beginning at age 69 years and continuing for as long as you are in good health. Consult with your health care provider.  Pap test.** / Every 3 years starting at age 29 years through age 47 or 54 years with a history of 3 consecutive normal Pap tests.  HPV screening.** / Every 3 years from ages 1 years through ages 108 to 42 years with a history of 3 consecutive normal Pap tests.  Fecal occult blood test (FOBT) of stool. / Every year beginning at age 36 years and continuing until age 71 years. You may not need to do this test if you get a colonoscopy every 10 years.  Flexible sigmoidoscopy or colonoscopy.** / Every 5 years for a flexible sigmoidoscopy or every 10 years for a colonoscopy beginning at age 65 years and continuing  until age 21 years.  Hepatitis C blood test.** / For all people born from [birth year] through 1965 and any individual with known risks for hepatitis C.  Skin self-exam. / Monthly.  Influenza vaccine. / Every year.  Tetanus, diphtheria, and acellular pertussis (Tdap/Td) vaccine.** / Consult your health care provider. Pregnant women should receive 1 dose of Tdap vaccine during each pregnancy. 1 dose of Td every 10 years.  Varicella vaccine.** / Consult your health care provider. Pregnant females who do not have evidence of immunity should receive the first dose after pregnancy.  Zoster vaccine.** / 1 dose for adults aged 69 years or older.  Pneumococcal 13-valent conjugate (PCV13) vaccine.** / Consult your health care provider.  Pneumococcal polysaccharide (PPSV23) vaccine.** / 1 to 2 doses if you smoke cigarettes or if you have certain conditions.  Meningococcal vaccine.** / Consult your health care provider.  Hepatitis A vaccine.** / Consult your health care provider.  Hepatitis B vaccine.** / Consult your health care provider. Screening for abdominal aortic aneurysm (AAA)  by ultrasound is recommended for people over 50 who have history of high blood pressure or who are current or former smokers.

## 2015-05-13 NOTE — Progress Notes (Signed)
Patient ID: Allison Contreras, female   DOB: 19-Jun-1965, 49 y.o.   MRN: [id number]  Annual Screening/Preventative Visit And Comprehensive Evaluation &  Examination      This very nice 49 y.o. MWF presents for presents for a Wellness/Preventative Visit & comprehensive evaluation and management of multiple medical co-morbidities.  Patient has been followed for HTN, Prediabetes, Hyperlipidemia, Hypothyroidism  and Vitamin D Deficiency.       HTN predates since 2000. Patient's BP has been controlled at home and patient denies any cardiac symptoms as chest pain, palpitations, shortness of breath, dizziness or ankle swelling. Today's BP: (!) 144/86 mmHg       Patient's hyperlipidemia is controlled with diet and medications. Patient denies myalgias or other medication SE's. Last lipids were not at goal  with Cholesterol 198; HDL 17*; LDL Cholesterol NOT CALC; and very elevated Triglycerides 905 on 10/30/2014. Patient admits that she sporadically takes her fenofibrate.       Patient has prediabetes predating since 2015 with A1c 5.7% and elevated insulin 41 and patient denies reactive hypoglycemic symptoms, visual blurring, diabetic polys, or paresthesias. Last A1c was  6.1% on 10/30/2014.      Patient has been on Thyroid replacement since 1997. Finally, patient has history of Vitamin D Deficiency of 20 in 2013 & 16 in 2014 and last Vitamin D was still very low at 22 on 10/30/2014.    Medication Sig  . bisoprolol-hctz (ZIAC) 10-6.25 MG  Take 1 tablet by mouth daily.  . fenofibrate  134 MG capsule TAKE ONE CAPSULE EVERY DAY  . Levothyroxine 112 MCG tablet TAKE 1 TABLET EVERY DAY BEFORE BREAKFAST   Allergies  Allergen Reactions  . Codeine   . Ppd [Tuberculin Purified Protein Derivative]     Positive reaction 2001   Past Medical History  Diagnosis Date  . Hypothyroidism   . Hypertension    Health Maintenance  Topic Date Due  . HIV Screening  01/20/1981  . INFLUENZA VACCINE  12/14/2014  . PAP SMEAR   03/22/2016  . MAMMOGRAM  04/11/2016  . TETANUS/TDAP  08/01/2022   Immunization History  Administered Date(s) Administered  . Td 06/29/2001, 07/31/2012  . Tdap 07/31/2012   Past Surgical History  Procedure Laterality Date  . Lymph node dissection      NECK- BENIGN   Family History  Problem Relation Age of Onset  . Cancer Mother     NON HODGKINS LYMPHOMA  . Hypertension Mother   . Diabetes Mother   . Hyperlipidemia Mother   . Hypertension Father   . Heart disease Father    Social History  Substance Use Topics  . Smoking status: Never Smoker   . Smokeless tobacco: Never Used  . Alcohol Use: Yes     Comment: occ.    ROS Constitutional: Denies fever, chills, weight loss/gain, headaches, insomnia,  night sweats, and change in appetite. Does c/o fatigue. Eyes: Denies redness, blurred vision, diplopia, discharge, itchy, watery eyes.  ENT: Denies discharge, congestion, post nasal drip, epistaxis, sore throat, earache, hearing loss, dental pain, Tinnitus, Vertigo, Sinus pain, snoring.  Cardio: Denies chest pain, palpitations, irregular heartbeat, syncope, dyspnea, diaphoresis, orthopnea, PND, claudication, edema Respiratory: denies cough, dyspnea, DOE, pleurisy, hoarseness, laryngitis, wheezing.  Gastrointestinal: Denies dysphagia, heartburn, reflux, water brash, pain, cramps, nausea, vomiting, bloating, diarrhea, constipation, hematemesis, melena, hematochezia, jaundice, hemorrhoids Genitourinary: Denies dysuria, frequency, urgency, nocturia, hesitancy, discharge, hematuria, flank pain Breast: Breast lumps, nipple discharge, bleeding.  Musculoskeletal: Denies arthralgia, myalgia, stiffness, Jt. Swelling, pain, limp, and strain/sprain.  Denies falls. Skin: Denies puritis, rash, hives, warts, acne, eczema, changing in skin lesion Neuro: No weakness, tremor, incoordination, spasms, paresthesia, pain Psychiatric: Denies confusion, memory loss, sensory loss. Denies Depression. Endocrine:  Denies change in weight, skin, hair change, nocturia, and paresthesia, diabetic polys, visual blurring, hyper / hypo glycemic episodes.  Heme/Lymph: No excessive bleeding, bruising, enlarged lymph nodes.  Physical Exam  BP 144/86 mmHg  Pulse 72  Temp(Src) 97.4 F (36.3 C)  Resp 16  Ht 5\' 2"  (1.575 m)  Wt 168 lb 3.2 oz (76.295 kg)  BMI 30.76 kg/m2  General Appearance: Well nourished and in no apparent distress. Eyes: PERRLA, EOMs, conjunctiva no swelling or erythema, normal fundi and vessels. Sinuses: No frontal/maxillary tenderness ENT/Mouth: EACs patent / TMs  nl. Nares clear without erythema, swelling, mucoid exudates. Oral hygiene is good. No erythema, swelling, or exudate. Tongue normal, non-obstructing. Tonsils not swollen or erythematous. Hearing normal.  Neck: Supple, thyroid normal. No bruits, nodes or JVD. Respiratory: Respiratory effort normal.  BS equal and clear bilateral without rales, rhonci, wheezing or stridor. Cardio: Heart sounds are normal with regular rate and rhythm and no murmurs, rubs or gallops. Peripheral pulses are normal and equal bilaterally without edema. No aortic or femoral bruits. Chest: symmetric with normal excursions and percussion. Breasts: deferred Abdomen: Flat, soft, with bowl sounds. Nontender, no guarding, rebound, hernias, masses, or organomegaly.  Lymphatics: Non tender without lymphadenopathy.  Musculoskeletal: Full ROM all peripheral extremities, joint stability, 5/5 strength, and normal gait. Skin: Warm and dry without rashes, lesions, cyanosis, clubbing or  ecchymosis.  Neuro: Cranial nerves intact, reflexes equal bilaterally. Normal muscle tone, no cerebellar symptoms. Sensation intact.  Pysch: Alert and oriented X 3, normal affect, Insight and Judgment appropriate.   Assessment and Plan  1. Annual Preventative Screening Examination   2. Essential hypertension  - Microalbumin / creatinine urine ratio - EKG 12-Lead - US,  RETROPERITNL ABD,  LTD - TSH  3. Hyperlipidemia  - Lipid panel - TSH  4. Prediabetes  - Hemoglobin A1c - Insulin, random  5. Vitamin D deficiency  - VITAMIN D 25 Hydroxy   6. Hypothyroidism   7. Morbid obesity,  (HCC)   8. Screening for rectal cancer  - POC Hemoccult Bld/Stl   9. Other fatigue  - Vitamin B12 - Iron and TIBC  10. Medication management  - Urinalysis, Routine w reflex microscopic  - CBC with Differential/Platelet - BASIC METABOLIC PANEL WITH GFR - Hepatic function panel - Magnesium   Continue prudent diet as discussed, weight control, BP monitoring, regular exercise, and medications. Discussed med's effects and SE's. Screening labs and tests as requested with regular follow-up as recommended. Over 40 minutes of exam, counseling, chart review and high complex critical decision making was performed.

## 2015-05-14 LAB — URINALYSIS, ROUTINE W REFLEX MICROSCOPIC
Bilirubin Urine: NEGATIVE
Glucose, UA: NEGATIVE
Hgb urine dipstick: NEGATIVE
Ketones, ur: NEGATIVE
Nitrite: NEGATIVE
Protein, ur: NEGATIVE
Specific Gravity, Urine: 1.01 (ref 1.001–1.035)
pH: 5 (ref 5.0–8.0)

## 2015-05-14 LAB — MICROALBUMIN / CREATININE URINE RATIO
Creatinine, Urine: 46 mg/dL (ref 20–320)
Microalb Creat Ratio: 11 mcg/mg creat (ref <30)
Microalb, Ur: 0.5 mg/dL

## 2015-05-14 LAB — LIPID PANEL
Cholesterol: 200 mg/dL (ref 125–200)
HDL: 16 mg/dL — ABNORMAL LOW (ref >=46)
Total CHOL/HDL Ratio: 12.5 Ratio — ABNORMAL HIGH (ref <=5.0)
Triglycerides: 1289 mg/dL — ABNORMAL HIGH (ref <150)

## 2015-05-14 LAB — HEPATIC FUNCTION PANEL
ALT: 25 U/L (ref 6–29)
AST: 21 U/L (ref 10–35)
Albumin: 4.4 g/dL (ref 3.6–5.1)
Alkaline Phosphatase: 54 U/L (ref 33–115)
Bilirubin, Direct: <0.1 mg/dL (ref <=0.2)
Total Bilirubin: 0.4 mg/dL (ref 0.2–1.2)
Total Protein: 7.5 g/dL (ref 6.1–8.1)

## 2015-05-14 LAB — BASIC METABOLIC PANEL WITH GFR
BUN: 8 mg/dL (ref 7–25)
CO2: 24 mmol/L (ref 20–31)
Calcium: 9.5 mg/dL (ref 8.6–10.2)
Chloride: 100 mmol/L (ref 98–110)
Creat: 0.72 mg/dL (ref 0.50–1.10)
GFR, Est African American: >89 mL/min (ref >=60)
GFR, Est Non African American: >89 mL/min (ref >=60)
Glucose, Bld: 104 mg/dL — ABNORMAL HIGH (ref 65–99)
Potassium: 4.2 mmol/L (ref 3.5–5.3)
Sodium: 136 mmol/L (ref 135–146)

## 2015-05-14 LAB — IRON AND TIBC
%SAT: 14 % (ref 11–50)
Iron: 48 ug/dL (ref 40–190)
TIBC: 351 ug/dL (ref 250–450)
UIBC: 303 ug/dL (ref 125–400)

## 2015-05-14 LAB — URINALYSIS, MICROSCOPIC ONLY
Bacteria, UA: NONE SEEN [HPF]
Casts: NONE SEEN [LPF]
Crystals: NONE SEEN [HPF]
Yeast: NONE SEEN [HPF]

## 2015-05-14 LAB — HEMOGLOBIN A1C
Hgb A1c MFr Bld: 6 % — ABNORMAL HIGH (ref <5.7)
Mean Plasma Glucose: 126 mg/dL — ABNORMAL HIGH (ref <117)

## 2015-05-14 LAB — MAGNESIUM: Magnesium: 2.2 mg/dL (ref 1.5–2.5)

## 2015-05-14 LAB — VITAMIN D 25 HYDROXY (VIT D DEFICIENCY, FRACTURES): Vit D, 25-Hydroxy: 15 ng/mL — ABNORMAL LOW (ref 30–100)

## 2015-05-14 LAB — VITAMIN B12: Vitamin B-12: 488 pg/mL (ref 211–911)

## 2015-05-14 LAB — TSH: TSH: 1.39 u[IU]/mL (ref 0.350–4.500)

## 2015-05-14 LAB — INSULIN, RANDOM: Insulin: 19 u[IU]/mL (ref 2.0–19.6)

## 2015-06-10 ENCOUNTER — Other Ambulatory Visit: Payer: Self-pay | Admitting: Physician Assistant

## 2015-06-23 ENCOUNTER — Other Ambulatory Visit: Payer: Self-pay | Admitting: *Deleted

## 2015-06-23 MED ORDER — LEVOTHYROXINE SODIUM 112 MCG PO TABS: ORAL_TABLET | ORAL | Status: DC

## 2015-06-25 ENCOUNTER — Other Ambulatory Visit: Payer: Self-pay | Admitting: *Deleted

## 2015-06-25 MED ORDER — FENOFIBRATE 160 MG PO TABS: 160.0000 mg | ORAL_TABLET | Freq: Every day | ORAL | Status: DC

## 2015-07-19 ENCOUNTER — Other Ambulatory Visit: Payer: Self-pay | Admitting: Internal Medicine

## 2015-08-11 ENCOUNTER — Encounter: Payer: Self-pay | Admitting: Internal Medicine

## 2015-08-11 ENCOUNTER — Ambulatory Visit (INDEPENDENT_AMBULATORY_CARE_PROVIDER_SITE_OTHER): Payer: BLUE CROSS/BLUE SHIELD | Admitting: Internal Medicine

## 2015-08-11 VITALS — BP 126/82 | HR 64 | Temp 97.8°F | Resp 18 | Ht 62.0 in | Wt 167.0 lb

## 2015-08-11 DIAGNOSIS — R7303 Prediabetes: Secondary | ICD-10-CM

## 2015-08-11 DIAGNOSIS — I10 Essential (primary) hypertension: Secondary | ICD-10-CM | POA: Diagnosis not present

## 2015-08-11 DIAGNOSIS — E559 Vitamin D deficiency, unspecified: Secondary | ICD-10-CM

## 2015-08-11 DIAGNOSIS — E785 Hyperlipidemia, unspecified: Secondary | ICD-10-CM

## 2015-08-11 DIAGNOSIS — E039 Hypothyroidism, unspecified: Secondary | ICD-10-CM

## 2015-08-11 DIAGNOSIS — Z79899 Other long term (current) drug therapy: Secondary | ICD-10-CM

## 2015-08-11 LAB — LIPID PANEL
Cholesterol: 195 mg/dL (ref 125–200)
HDL: 25 mg/dL — ABNORMAL LOW (ref >=46)
Total CHOL/HDL Ratio: 7.8 Ratio — ABNORMAL HIGH (ref <=5.0)
Triglycerides: 584 mg/dL — ABNORMAL HIGH (ref <150)

## 2015-08-11 LAB — CBC WITH DIFFERENTIAL/PLATELET
Basophils Absolute: 0 10*3/uL (ref 0.0–0.1)
Basophils Relative: 0 % (ref 0–1)
Eosinophils Absolute: 0.1 10*3/uL (ref 0.0–0.7)
Eosinophils Relative: 1 % (ref 0–5)
HCT: 42.8 % (ref 36.0–46.0)
Hemoglobin: 14.2 g/dL (ref 12.0–15.0)
Lymphocytes Relative: 29 % (ref 12–46)
Lymphs Abs: 2.6 10*3/uL (ref 0.7–4.0)
MCH: 28.5 pg (ref 26.0–34.0)
MCHC: 33.2 g/dL (ref 30.0–36.0)
MCV: 85.8 fL (ref 78.0–100.0)
MPV: 9.5 fL (ref 8.6–12.4)
Monocytes Absolute: 0.5 10*3/uL (ref 0.1–1.0)
Monocytes Relative: 6 % (ref 3–12)
Neutro Abs: 5.8 10*3/uL (ref 1.7–7.7)
Neutrophils Relative %: 64 % (ref 43–77)
Platelets: 264 10*3/uL (ref 150–400)
RBC: 4.99 MIL/uL (ref 3.87–5.11)
RDW: 14.8 % (ref 11.5–15.5)
WBC: 9 10*3/uL (ref 4.0–10.5)

## 2015-08-11 LAB — BASIC METABOLIC PANEL WITH GFR
BUN: 11 mg/dL (ref 7–25)
CO2: 27 mmol/L (ref 20–31)
Calcium: 10.2 mg/dL (ref 8.6–10.2)
Chloride: 97 mmol/L — ABNORMAL LOW (ref 98–110)
Creat: 0.79 mg/dL (ref 0.50–1.10)
GFR, Est African American: >89 mL/min (ref >=60)
GFR, Est Non African American: 88 mL/min (ref >=60)
Glucose, Bld: 106 mg/dL — ABNORMAL HIGH (ref 65–99)
Potassium: 4.5 mmol/L (ref 3.5–5.3)
Sodium: 136 mmol/L (ref 135–146)

## 2015-08-11 LAB — HEPATIC FUNCTION PANEL
ALT: 40 U/L — ABNORMAL HIGH (ref 6–29)
AST: 27 U/L (ref 10–35)
Albumin: 4.7 g/dL (ref 3.6–5.1)
Alkaline Phosphatase: 58 U/L (ref 33–115)
Bilirubin, Direct: 0.1 mg/dL (ref <=0.2)
Indirect Bilirubin: 0.5 mg/dL (ref 0.2–1.2)
Total Bilirubin: 0.6 mg/dL (ref 0.2–1.2)
Total Protein: 7.9 g/dL (ref 6.1–8.1)

## 2015-08-11 LAB — TSH: TSH: 2.34 mIU/L

## 2015-08-11 MED ORDER — FENOFIBRATE 145 MG PO TABS: 145.0000 mg | ORAL_TABLET | Freq: Every day | ORAL | Status: DC

## 2015-08-11 NOTE — Progress Notes (Signed)
Assessment and Plan:  Hypertension:  -Continue medication,  -monitor blood pressure at home.  -Continue DASH diet.   -Reminder to go to the ER if any CP, SOB, nausea, dizziness, severe HA, changes vision/speech, left arm numbness and tingling, and jaw pain.  Cholesterol: -tricor 145 sent back in -start triglycerides -Continue diet and exercise.  -Check cholesterol.   Pre-diabetes: -Continue diet and exercise.  -Check A1C  Vitamin D Def: -check level -continue medications.   Continue diet and meds as discussed. Further disposition pending results of labs.  HPI 50 y.o. female  presents for 3 month follow up with hypertension, hyperlipidemia, prediabetes and vitamin D.   Her blood pressure has been controlled at home, today their BP is BP: 126/82 mmHg.   She does workout. She denies chest pain, shortness of breath, dizziness.  She reports that she is walking on the treadmill and is thinking about starting up zumba again.     She is on cholesterol medication and denies myalgias. Her cholesterol is at goal. The cholesterol last visit was:   Lab Results  Component Value Date   CHOL 200 05/13/2015   HDL 16* 05/13/2015   LDLCALC NOT CALC 05/13/2015   TRIG 1289* 05/13/2015   CHOLHDL 12.5* 05/13/2015  She reports that she has not been taking fenofibrate.  She reports that the prescription changed and she hasn't been taking it.     She has been working on diet and exercise for prediabetes, and denies foot ulcerations, hyperglycemia, hypoglycemia , increased appetite, nausea, paresthesia of the feet, polydipsia, polyuria, visual disturbances, vomiting and weight loss. Last A1C in the office was:  Lab Results  Component Value Date   HGBA1C 6.0* 05/13/2015    Patient is on Vitamin D supplement.  Lab Results  Component Value Date   VD25OH 15* 05/13/2015      Current Medications:  Current Outpatient Prescriptions on File Prior to Visit  Medication Sig Dispense Refill  .  bisoprolol-hydrochlorothiazide (ZIAC) 10-6.25 MG tablet TAKE ONE TABLET BY MOUTH  DAILY 90 tablet 1  . levothyroxine (SYNTHROID, LEVOTHROID) 112 MCG tablet TAKE 1 TABLET EVERY DAY BEFORE BREAKFAST 90 tablet 1   No current facility-administered medications on file prior to visit.    Medical History:  Past Medical History  Diagnosis Date  . Hypothyroidism   . Hypertension     Allergies:  Allergies  Allergen Reactions  . Codeine   . Ppd [Tuberculin Purified Protein Derivative]     Positive reaction 2001     Review of Systems:  Review of Systems  Constitutional: Negative for fever, chills and malaise/fatigue.  HENT: Negative for congestion, ear pain and sore throat.   Eyes: Negative.   Respiratory: Negative for cough, shortness of breath and wheezing.   Cardiovascular: Negative for chest pain, palpitations and leg swelling.  Gastrointestinal: Negative for heartburn, abdominal pain, diarrhea, constipation, blood in stool and melena.  Genitourinary: Negative.   Skin: Negative.   Neurological: Negative for dizziness, sensory change, loss of consciousness and headaches.  Psychiatric/Behavioral: Negative for depression. The patient is not nervous/anxious and does not have insomnia.     Family history- Review and unchanged  Social history- Review and unchanged  Physical Exam: BP 126/82 mmHg  Pulse 64  Temp(Src) 97.8 F (36.6 C) (Temporal)  Resp 18  Ht 5\' 2"  (1.575 m)  Wt 167 lb (75.751 kg)  BMI 30.54 kg/m2 Wt Readings from Last 3 Encounters:  08/11/15 167 lb (75.751 kg)  05/13/15 168 lb 3.2 oz (  76.295 kg)  03/23/15 169 lb (76.658 kg)    General Appearance: Well nourished well developed, in no apparent distress. Eyes: PERRLA, EOMs, conjunctiva no swelling or erythema ENT/Mouth: Ear canals normal without obstruction, swelling, erythma, discharge.  TMs normal bilaterally.  Oropharynx moist, clear, without exudate, or postoropharyngeal swelling. Neck: Supple, thyroid  normal,no cervical adenopathy  Respiratory: Respiratory effort normal, Breath sounds clear A&P without rhonchi, wheeze, or rale.  No retractions, no accessory usage. Cardio: RRR with no MRGs. Brisk peripheral pulses without edema.  Abdomen: Soft, + BS,  Non tender, no guarding, rebound, hernias, masses. Musculoskeletal: Full ROM, 5/5 strength, Normal gait Skin: Warm, dry without rashes, lesions, ecchymosis.  Neuro: Awake and oriented X 3, Cranial nerves intact. Normal muscle tone, no cerebellar symptoms. Psych: Normal affect, Insight and Judgment appropriate.    Terri Piedra, PA-C 9:35 AM [facility]

## 2015-08-12 ENCOUNTER — Other Ambulatory Visit: Payer: Self-pay | Admitting: *Deleted

## 2015-08-12 LAB — HEMOGLOBIN A1C
Hgb A1c MFr Bld: 5.9 % — ABNORMAL HIGH (ref <5.7)
Mean Plasma Glucose: 123 mg/dL

## 2015-08-12 MED ORDER — FENOFIBRATE MICRONIZED 134 MG PO CAPS: 134.0000 mg | ORAL_CAPSULE | Freq: Every day | ORAL | Status: DC

## 2015-10-18 ENCOUNTER — Other Ambulatory Visit: Payer: Self-pay | Admitting: *Deleted

## 2015-10-18 MED ORDER — BISOPROLOL-HYDROCHLOROTHIAZIDE 10-6.25 MG PO TABS: ORAL_TABLET | ORAL | Status: DC

## 2015-11-18 ENCOUNTER — Encounter: Payer: Self-pay | Admitting: Internal Medicine

## 2015-11-18 ENCOUNTER — Ambulatory Visit (INDEPENDENT_AMBULATORY_CARE_PROVIDER_SITE_OTHER): Payer: BLUE CROSS/BLUE SHIELD | Admitting: Internal Medicine

## 2015-11-18 VITALS — BP 140/78 | HR 64 | Temp 97.1°F | Resp 16 | Ht 62.0 in | Wt 167.2 lb

## 2015-11-18 DIAGNOSIS — Z79899 Other long term (current) drug therapy: Secondary | ICD-10-CM | POA: Diagnosis not present

## 2015-11-18 DIAGNOSIS — E039 Hypothyroidism, unspecified: Secondary | ICD-10-CM

## 2015-11-18 DIAGNOSIS — E785 Hyperlipidemia, unspecified: Secondary | ICD-10-CM | POA: Diagnosis not present

## 2015-11-18 DIAGNOSIS — E559 Vitamin D deficiency, unspecified: Secondary | ICD-10-CM

## 2015-11-18 DIAGNOSIS — I10 Essential (primary) hypertension: Secondary | ICD-10-CM | POA: Diagnosis not present

## 2015-11-18 DIAGNOSIS — R7303 Prediabetes: Secondary | ICD-10-CM

## 2015-11-18 LAB — HEPATIC FUNCTION PANEL
ALT: 36 U/L — ABNORMAL HIGH (ref 6–29)
AST: 24 U/L (ref 10–35)
Albumin: 4.5 g/dL (ref 3.6–5.1)
Alkaline Phosphatase: 55 U/L (ref 33–115)
Bilirubin, Direct: 0.1 mg/dL (ref <=0.2)
Indirect Bilirubin: 0.4 mg/dL (ref 0.2–1.2)
Total Bilirubin: 0.5 mg/dL (ref 0.2–1.2)
Total Protein: 7.9 g/dL (ref 6.1–8.1)

## 2015-11-18 LAB — CBC WITH DIFFERENTIAL/PLATELET
Basophils Absolute: 0 cells/uL (ref 0–200)
Basophils Relative: 0 %
Eosinophils Absolute: 162 cells/uL (ref 15–500)
Eosinophils Relative: 2 %
HCT: 42.2 % (ref 35.0–45.0)
Hemoglobin: 14 g/dL (ref 11.7–15.5)
Lymphocytes Relative: 27 %
Lymphs Abs: 2187 cells/uL (ref 850–3900)
MCH: 28.6 pg (ref 27.0–33.0)
MCHC: 33.2 g/dL (ref 32.0–36.0)
MCV: 86.1 fL (ref 80.0–100.0)
MPV: 9.5 fL (ref 7.5–12.5)
Monocytes Absolute: 567 cells/uL (ref 200–950)
Monocytes Relative: 7 %
Neutro Abs: 5184 cells/uL (ref 1500–7800)
Neutrophils Relative %: 64 %
Platelets: 273 10*3/uL (ref 140–400)
RBC: 4.9 MIL/uL (ref 3.80–5.10)
RDW: 15 % (ref 11.0–15.0)
WBC: 8.1 10*3/uL (ref 3.8–10.8)

## 2015-11-18 LAB — BASIC METABOLIC PANEL WITH GFR
BUN: 13 mg/dL (ref 7–25)
CO2: 26 mmol/L (ref 20–31)
Calcium: 9.8 mg/dL (ref 8.6–10.2)
Chloride: 98 mmol/L (ref 98–110)
Creat: 0.84 mg/dL (ref 0.50–1.10)
GFR, Est African American: >89 mL/min (ref >=60)
GFR, Est Non African American: 82 mL/min (ref >=60)
Glucose, Bld: 108 mg/dL — ABNORMAL HIGH (ref 65–99)
Potassium: 4.5 mmol/L (ref 3.5–5.3)
Sodium: 137 mmol/L (ref 135–146)

## 2015-11-18 LAB — MAGNESIUM: Magnesium: 2.1 mg/dL (ref 1.5–2.5)

## 2015-11-18 LAB — LIPID PANEL
Cholesterol: 195 mg/dL (ref 125–200)
HDL: 19 mg/dL — ABNORMAL LOW (ref >=46)
Total CHOL/HDL Ratio: 10.3 Ratio — ABNORMAL HIGH (ref <=5.0)
Triglycerides: 889 mg/dL — ABNORMAL HIGH (ref <150)

## 2015-11-18 LAB — HEMOGLOBIN A1C
Hgb A1c MFr Bld: 5.8 % — ABNORMAL HIGH (ref <5.7)
Mean Plasma Glucose: 120 mg/dL

## 2015-11-18 LAB — TSH: TSH: 1.92 mIU/L

## 2015-11-18 NOTE — Progress Notes (Signed)
Patient ID: Allison BonesSharon Roughton, female   DOB: 08/03/1965, 50 y.o.   MRN: [id number]  [facility]                       Lucky CowboyWilliam Marcine Gadway, M.D.        [name], P.A.-C       [name], P.A.-C   [facility]                [address]                [place], [place] [id number] Telephone [phone] Telefax [phone] ______________________________________________________________________     This very nice 50 y.o. MWF presents for 52month follow up with Hypertension, Hyperlipidemia, Pre-Diabetes, Hypothyroidism  and Vitamin D Deficiency.      Patient is treated for HTN circa 2000 & BP has been controlled at home. Today's BP: 140/78 mmHg. Patient has had no complaints of any cardiac type chest pain, palpitations, dyspnea/orthopnea/PND, dizziness, claudication, or dependent edema.     Hyperlipidemia is controlled with diet & meds. Patient denies myalgias or other med SE's. Last Lipids were at goal with Cholesterol 195; HDL 25*; LDL NOT CALC;  and elevated Triglycerides 584 on 08/11/2015.     Also, the patient has history of Morbid Obesity (BMI 30+) and consequent PreDiabetes with A1c 5.7% & elevated Insulin "41" in 2015  and has had no symptoms of reactive hypoglycemia, diabetic polys, paresthesias or visual blurring.  Last A1c was 5.9% on 08/11/2015.     Patient has been on Thyroid replacement since 1997. Further, the patient also has history of Vitamin D Deficiency  Of "20" in 2013 and "16" in 2014 and  Sporadically supplements vitamin D without any suspected side-effects. She relates not on Vit D for 2-3 weeks. Last vitamin D was  Still very low at 15 on 05/13/2015.    Medication Sig  . bisoprolol-hctz (ZIAC) 10-6.25 TAKE ONE TABLET BY MOUTH  DAILY  . fenofibrate  134 MG  Take 1 capsule (134 mg total) by mouth daily - OFF x 1+ month   . levothyroxine  112 MCG tablet TAKE 1 TABLET EVERY DAY BEFORE BREAKFAST    Allergies  Allergen Reactions  . Codeine   . Ppd [Tuberculin Purified Protein Derivative]     Positive reaction 2001   PMHx:   Past Medical History  Diagnosis Date  . Hypothyroidism   . Hypertension    Immunization History  Administered Date(s) Administered  . Td 06/29/2001, 07/31/2012  . Tdap 07/31/2012   Past Surgical History  Procedure Laterality Date  . Lymph node dissection      NECK- BENIGN   FHx:    Reviewed / unchanged  SHx:    Reviewed / unchanged  Systems Review:  Constitutional: Denies fever, chills, wt changes, headaches, insomnia, fatigue, night sweats, change in appetite. Eyes: Denies redness, blurred vision, diplopia, discharge, itchy, watery eyes.  ENT: Denies discharge, congestion, post nasal drip, epistaxis, sore throat, earache, hearing loss, dental pain, tinnitus, vertigo, sinus pain, snoring.  CV: Denies chest pain, palpitations, irregular heartbeat, syncope, dyspnea, diaphoresis, orthopnea, PND, claudication or edema. Respiratory: denies cough, dyspnea, DOE, pleurisy, hoarseness, laryngitis, wheezing.  Gastrointestinal: Denies dysphagia, odynophagia, heartburn, reflux, water brash, abdominal pain or cramps, nausea, vomiting, bloating, diarrhea, constipation, hematemesis, melena, hematochezia  or hemorrhoids. Genitourinary: Denies dysuria, frequency, urgency, nocturia, hesitancy, discharge, hematuria or flank pain. Musculoskeletal: Denies arthralgias, myalgias, stiffness, jt. swelling, pain,  limping or strain/sprain.  Skin: Denies pruritus, rash, hives, warts, acne, eczema or change in skin lesion(s). Neuro: No weakness, tremor, incoordination, spasms, paresthesia or pain. Psychiatric: Denies confusion, memory loss or sensory loss. Endo: Denies change in weight, skin or hair change.  Heme/Lymph: No excessive bleeding, bruising or enlarged lymph nodes.  Physical Exam  BP 140/78 mmHg  Pulse 64  Temp(Src) 97.1 F (36.2 C)  Resp 16  Ht 5\' 2"  (1.575  m)  Wt 167 lb 3.2 oz (75.841 kg)  BMI 30.57 kg/m2  Appears overnourished and in no distress. Eyes: PERRLA, EOMs, conjunctiva no swelling or erythema. Sinuses: No frontal/maxillary tenderness ENT/Mouth: EAC's clear, TM's nl w/o erythema, bulging. Nares clear w/o erythema, swelling, exudates. Oropharynx clear without erythema or exudates. Oral hygiene is good. Tongue normal, non obstructing. Hearing intact.  Neck: Supple. Thyroid nl. Car 2+/2+ without bruits, nodes or JVD. Chest: Respirations nl with BS clear & equal w/o rales, rhonchi, wheezing or stridor.  Cor: Heart sounds normal w/ regular rate and rhythm without sig. murmurs, gallops, clicks, or rubs. Peripheral pulses normal and equal  without edema.  Abdomen: Soft & bowel sounds normal. Non-tender w/o guarding, rebound, hernias, masses, or organomegaly.  Lymphatics: Unremarkable.  Musculoskeletal: Full ROM all peripheral extremities, joint stability, 5/5 strength, and normal gait.  Skin: Warm, dry without exposed rashes, lesions or ecchymosis apparent.  Neuro: Cranial nerves intact, reflexes equal bilaterally. Sensory-motor testing grossly intact. Tendon reflexes grossly intact.  Pysch: Alert & oriented x 3.  Insight and judgement nl & appropriate. No ideations.  Assessment and Plan:  1. Essential hypertension  -Continue medication, monitor blood pressure at home. Continue DASH diet. Reminder to go to the ER if any CP, SOB, nausea, dizziness, severe HA, changes vision/speech, left arm numbness and tingling and jaw pain. - TSH  2. Hyperlipidemia  - Continue diet/meds, exercise,& lifestyle modifications. Continue monitor periodic cholesterol/liver & renal functions  - Lipid panel - TSH  3. Prediabetes  - Continue diet, exercise, lifestyle modifications. Monitor appropriate labs. - Hemoglobin A1c - Insulin, random  4. Vitamin D deficiency  - Continue supplementation. - VITAMIN D 25 Hydroxy   5. Hypothyroidism  -  TSH  6. Medication management   - CBC with Differential/Platelet - BASIC METABOLIC PANEL WITH GFR - Hepatic function panel - Magnesium   Recommended regular exercise, BP monitoring, weight control, and discussed med and SE's. Recommended labs to assess and monitor clinical status. Further disposition pending results of labs. Over 30 minutes of exam, counseling, chart review was performed

## 2015-11-18 NOTE — Patient Instructions (Signed)

## 2015-11-19 LAB — VITAMIN D 25 HYDROXY (VIT D DEFICIENCY, FRACTURES): Vit D, 25-Hydroxy: 24 ng/mL — ABNORMAL LOW (ref 30–100)

## 2015-11-19 LAB — INSULIN, RANDOM: Insulin: 47.1 u[IU]/mL — ABNORMAL HIGH (ref 2.0–19.6)

## 2016-02-09 ENCOUNTER — Other Ambulatory Visit: Payer: Self-pay | Admitting: Internal Medicine

## 2016-02-29 ENCOUNTER — Ambulatory Visit (INDEPENDENT_AMBULATORY_CARE_PROVIDER_SITE_OTHER): Payer: BLUE CROSS/BLUE SHIELD | Admitting: Physician Assistant

## 2016-02-29 ENCOUNTER — Encounter: Payer: Self-pay | Admitting: Physician Assistant

## 2016-02-29 VITALS — BP 140/82 | HR 76 | Temp 97.7°F | Resp 16 | Ht 62.0 in | Wt 173.0 lb

## 2016-02-29 DIAGNOSIS — R7303 Prediabetes: Secondary | ICD-10-CM | POA: Diagnosis not present

## 2016-02-29 DIAGNOSIS — E785 Hyperlipidemia, unspecified: Secondary | ICD-10-CM

## 2016-02-29 DIAGNOSIS — Z79899 Other long term (current) drug therapy: Secondary | ICD-10-CM | POA: Diagnosis not present

## 2016-02-29 DIAGNOSIS — E559 Vitamin D deficiency, unspecified: Secondary | ICD-10-CM | POA: Diagnosis not present

## 2016-02-29 DIAGNOSIS — I10 Essential (primary) hypertension: Secondary | ICD-10-CM | POA: Diagnosis not present

## 2016-02-29 DIAGNOSIS — E039 Hypothyroidism, unspecified: Secondary | ICD-10-CM | POA: Diagnosis not present

## 2016-02-29 LAB — CBC WITH DIFFERENTIAL/PLATELET
Basophils Absolute: 0 cells/uL (ref 0–200)
Basophils Relative: 0 %
Eosinophils Absolute: 180 cells/uL (ref 15–500)
Eosinophils Relative: 2 %
HCT: 42.5 % (ref 35.0–45.0)
Hemoglobin: 14.4 g/dL (ref 11.7–15.5)
Lymphocytes Relative: 32 %
Lymphs Abs: 2880 cells/uL (ref 850–3900)
MCH: 29.8 pg (ref 27.0–33.0)
MCHC: 33.9 g/dL (ref 32.0–36.0)
MCV: 88 fL (ref 80.0–100.0)
MPV: 9.7 fL (ref 7.5–12.5)
Monocytes Absolute: 630 cells/uL (ref 200–950)
Monocytes Relative: 7 %
Neutro Abs: 5310 cells/uL (ref 1500–7800)
Neutrophils Relative %: 59 %
Platelets: 288 10*3/uL (ref 140–400)
RBC: 4.83 MIL/uL (ref 3.80–5.10)
RDW: 14.7 % (ref 11.0–15.0)
WBC: 9 10*3/uL (ref 3.8–10.8)

## 2016-02-29 LAB — HEMOGLOBIN A1C
Hgb A1c MFr Bld: 6 % — ABNORMAL HIGH (ref <5.7)
Mean Plasma Glucose: 126 mg/dL

## 2016-02-29 LAB — TSH: TSH: 1.64 mIU/L

## 2016-02-29 MED ORDER — FENOFIBRATE MICRONIZED 134 MG PO CAPS: 134.0000 mg | ORAL_CAPSULE | Freq: Every day | ORAL | 2 refills | Status: DC

## 2016-02-29 NOTE — Progress Notes (Signed)
Assessment and Plan:  Essential hypertension - continue medications, DASH diet, exercise and monitor at home. Call if greater than 130/80.  -     CBC with Differential/Platelet -     BASIC METABOLIC PANEL WITH GFR -     Hepatic function panel  Hyperlipidemia, unspecified hyperlipidemia type -continue medications, check lipids, decrease fatty foods, increase activity.  - get on fenofibrate daily, stop sweet tea, recheck 3 months -     fenofibrate micronized (LOFIBRA) 134 MG capsule; Take 1 capsule (134 mg total) by mouth daily before breakfast. -     Lipid panel  Hypothyroidism, unspecified type Hypothyroidism-check TSH level, continue medications the same, reminded to take on an empty stomach 30-8560mins before food.  -     TSH  Morbid obesity (BMI 30.94) - long discussion about weight loss, diet, and exercise  Prediabetes -     Hemoglobin A1c  Vitamin D deficiency -     VITAMIN D 25 Hydroxy (Vit-D Deficiency, Fractures)  Medication management -     Magnesium  Continue diet and meds as discussed. Further disposition pending results of labs.  HPI 50 y.o. female  presents for 3 month follow up  has a past medical history of Hypertension and Hypothyroidism.  Her blood pressure has been controlled at home, today their BP is BP: 140/82  She does not workout. She denies chest pain, shortness of breath, dizziness.  She is on cholesterol medication, fenofibrate but admits to not taking it daily, she also drinks sweet tea and denies myalgias. Her cholesterol is not at goal. The cholesterol last visit was:   Lab Results  Component Value Date   CHOL 195 11/18/2015   HDL 19 (L) 11/18/2015   LDLCALC NOT CALC 11/18/2015   TRIG 889 (H) 11/18/2015   CHOLHDL 10.3 (H) 11/18/2015    She has been working on diet and exercise for prediabetes, and denies paresthesia of the feet, polydipsia, polyuria and visual disturbances. Last A1C in the office was:  Lab Results  Component Value Date   HGBA1C 5.8 (H) 11/18/2015   Patient is on Vitamin D supplement.   Lab Results  Component Value Date   VD25OH 24 (L) 11/18/2015     BMI is Body mass index is 31.64 kg/m., she is working on diet and exercise. Wt Readings from Last 3 Encounters:  02/29/16 173 lb (78.5 kg)  11/18/15 167 lb 3.2 oz (75.8 kg)  08/11/15 167 lb (75.8 kg)   She is on thyroid medication. Her medication was not changed last visit.   Lab Results  Component Value Date   TSH 1.92 11/18/2015  .   Current Medications:  Current Outpatient Prescriptions on File Prior to Visit  Medication Sig Dispense Refill  . bisoprolol-hydrochlorothiazide (ZIAC) 10-6.25 MG tablet TAKE ONE TABLET BY MOUTH  DAILY 90 tablet 1  . levothyroxine (SYNTHROID, LEVOTHROID) 112 MCG tablet TAKE 1 TABLET EVERY DAY BEFORE BREAKFAST 90 tablet 1   No current facility-administered medications on file prior to visit.    Medical History:  Past Medical History:  Diagnosis Date  . Hypertension   . Hypothyroidism    Allergies:  Allergies  Allergen Reactions  . Codeine   . Ppd [Tuberculin Purified Protein Derivative]     Positive reaction 2001     Review of Systems:  Review of Systems  Constitutional: Negative.   HENT: Negative.   Eyes: Negative.   Respiratory: Negative.   Cardiovascular: Negative.   Gastrointestinal: Negative.   Genitourinary: Negative.  Musculoskeletal: Negative.   Skin: Negative.   Neurological: Negative.   Endo/Heme/Allergies: Negative.   Psychiatric/Behavioral: Negative.     Family history- Review and unchanged Social history- Review and unchanged Physical Exam: BP 140/82   Pulse 76   Temp 97.7 F (36.5 C)   Resp 16   Ht 5\' 2"  (1.575 m)   Wt 173 lb (78.5 kg)   SpO2 98%   BMI 31.64 kg/m  Wt Readings from Last 3 Encounters:  02/29/16 173 lb (78.5 kg)  11/18/15 167 lb 3.2 oz (75.8 kg)  08/11/15 167 lb (75.8 kg)   General Appearance: Well nourished, in no apparent distress. Eyes: PERRLA, EOMs,  conjunctiva no swelling or erythema Sinuses: No Frontal/maxillary tenderness ENT/Mouth: Ext aud canals clear, TMs without erythema, bulging. No erythema, swelling, or exudate on post pharynx.  Tonsils not swollen or erythematous. Hearing normal.  Neck: Supple, thyroid normal.  Respiratory: Respiratory effort normal, BS equal bilaterally without rales, rhonchi, wheezing or stridor.  Cardio: RRR with no MRGs. Brisk peripheral pulses without edema.  Abdomen: Soft, + BS,  Non tender, no guarding, rebound, hernias, masses. Lymphatics: Non tender without lymphadenopathy.  Musculoskeletal: Full ROM, 5/5 strength, Normal gait Skin: Warm, dry without rashes, lesions, ecchymosis.  Neuro: Cranial nerves intact. Normal muscle tone, no cerebellar symptoms. Psych: Awake and oriented X 3, normal affect, Insight and Judgment appropriate.    Quentin Mulling, PA-C 9:05 AM [facility]

## 2016-02-29 NOTE — Patient Instructions (Signed)
Your trigs are not in range, 11/18/2015: Triglycerides 889. Triglycerides are simple sugars in blood that are converted into a storage form. I recommend you avoid fried/greasy foods, sweets/candy, white rice , white potatoes,  anything made from white flour, sweet tea, soda, fruit juices and avoid alcohol in excess. Sweet potatoes, brown/wild rice/Quinoa, Vegetarian, spinach, or wheat pasta, Multi-grain bread - like multi-grain flat bread or sandwich thins are okay.  This is elevated enough to cause acute pancreatitis which can put you in the hospital and kill you. VERY VERY important to be strict with diet.      Bad carbs also include fruit juice, alcohol, and sweet tea. These are empty calories that do not signal to your brain that you are full.   Please remember the good carbs are still carbs which convert into sugar. So please measure them out no more than 1/2-1 cup of rice, oatmeal, pasta, and beans  Veggies are however free foods! Pile them on.   Not all fruit is created equal. Please see the list below, the fruit at the bottom is higher in sugars than the fruit at the top. Please avoid all dried fruits.

## 2016-03-01 ENCOUNTER — Other Ambulatory Visit: Payer: Self-pay | Admitting: Physician Assistant

## 2016-03-01 DIAGNOSIS — E785 Hyperlipidemia, unspecified: Secondary | ICD-10-CM

## 2016-03-01 LAB — BASIC METABOLIC PANEL WITH GFR
BUN: 11 mg/dL (ref 7–25)
CO2: 22 mmol/L (ref 20–31)
Calcium: 9.7 mg/dL (ref 8.6–10.4)
Chloride: 100 mmol/L (ref 98–110)
Creat: 0.86 mg/dL (ref 0.50–1.05)
GFR, Est African American: >89 mL/min (ref >=60)
GFR, Est Non African American: 79 mL/min (ref >=60)
Glucose, Bld: 124 mg/dL — ABNORMAL HIGH (ref 65–99)
Potassium: 4.6 mmol/L (ref 3.5–5.3)
Sodium: 135 mmol/L (ref 135–146)

## 2016-03-01 LAB — LIPID PANEL
Cholesterol: 231 mg/dL — ABNORMAL HIGH (ref 125–200)
HDL: 16 mg/dL — ABNORMAL LOW (ref >=46)
Total CHOL/HDL Ratio: 14.4 Ratio — ABNORMAL HIGH (ref <=5.0)
Triglycerides: 1772 mg/dL — ABNORMAL HIGH (ref <150)

## 2016-03-01 LAB — HEPATIC FUNCTION PANEL
ALT: 34 U/L — ABNORMAL HIGH (ref 6–29)
AST: 27 U/L (ref 10–35)
Albumin: 4.5 g/dL (ref 3.6–5.1)
Alkaline Phosphatase: 68 U/L (ref 33–130)
Bilirubin, Direct: 0 mg/dL (ref <=0.2)
Indirect Bilirubin: 0.4 mg/dL (ref 0.2–1.2)
Total Bilirubin: 0.4 mg/dL (ref 0.2–1.2)
Total Protein: 7.4 g/dL (ref 6.1–8.1)

## 2016-03-01 LAB — VITAMIN D 25 HYDROXY (VIT D DEFICIENCY, FRACTURES): Vit D, 25-Hydroxy: 22 ng/mL — ABNORMAL LOW (ref 30–100)

## 2016-03-01 LAB — MAGNESIUM: Magnesium: 2.3 mg/dL (ref 1.5–2.5)

## 2016-03-14 DIAGNOSIS — Z86018 Personal history of other benign neoplasm: Secondary | ICD-10-CM | POA: Diagnosis not present

## 2016-03-14 DIAGNOSIS — D225 Melanocytic nevi of trunk: Secondary | ICD-10-CM | POA: Diagnosis not present

## 2016-03-14 DIAGNOSIS — Z808 Family history of malignant neoplasm of other organs or systems: Secondary | ICD-10-CM | POA: Diagnosis not present

## 2016-03-14 DIAGNOSIS — L814 Other melanin hyperpigmentation: Secondary | ICD-10-CM | POA: Diagnosis not present

## 2016-03-21 ENCOUNTER — Other Ambulatory Visit: Payer: Self-pay | Admitting: Women's Health

## 2016-03-21 ENCOUNTER — Ambulatory Visit (INDEPENDENT_AMBULATORY_CARE_PROVIDER_SITE_OTHER): Payer: BLUE CROSS/BLUE SHIELD | Admitting: Women's Health

## 2016-03-21 ENCOUNTER — Encounter: Payer: Self-pay | Admitting: Women's Health

## 2016-03-21 VITALS — BP 126/80 | Ht 62.0 in | Wt 169.0 lb

## 2016-03-21 DIAGNOSIS — N912 Amenorrhea, unspecified: Secondary | ICD-10-CM | POA: Diagnosis not present

## 2016-03-21 DIAGNOSIS — Z01419 Encounter for gynecological examination (general) (routine) without abnormal findings: Secondary | ICD-10-CM | POA: Diagnosis not present

## 2016-03-21 DIAGNOSIS — Z1231 Encounter for screening mammogram for malignant neoplasm of breast: Secondary | ICD-10-CM

## 2016-03-21 NOTE — Patient Instructions (Addendum)
Fat and Cholesterol Restricted Diet High levels of fat and cholesterol in your blood may lead to various health problems, such as diseases of the heart, blood vessels, gallbladder, liver, and pancreas. Fats are concentrated sources of energy that come in various forms. Certain types of fat, including saturated fat, may be harmful in excess. Cholesterol is a substance needed by your body in small amounts. Your body makes all the cholesterol it needs. Excess cholesterol comes from the food you eat. When you have high levels of cholesterol and saturated fat in your blood, health problems can develop because the excess fat and cholesterol will gather along the walls of your blood vessels, causing them to narrow. Choosing the right foods will help you control your intake of fat and cholesterol. This will help keep the levels of these substances in your blood within normal limits and reduce your risk of disease. WHAT IS MY PLAN? Your health care provider recommends that you:  Get no more than __________ % of the total calories in your daily diet from fat.  Limit your intake of saturated fat to less than ______% of your total calories each day.  Limit the amount of cholesterol in your diet to less than _________mg per day. WHAT TYPES OF FAT SHOULD I CHOOSE?  Choose healthy fats more often. Choose monounsaturated and polyunsaturated fats, such as olive and canola oil, flaxseeds, walnuts, almonds, and seeds.  Eat more omega-3 fats. Good choices include salmon, mackerel, sardines, tuna, flaxseed oil, and ground flaxseeds. Aim to eat fish at least two times a week.  Limit saturated fats. Saturated fats are primarily found in animal products, such as meats, butter, and cream. Plant sources of saturated fats include palm oil, palm kernel oil, and coconut oil.  Avoid foods with partially hydrogenated oils in them. These contain trans fats. Examples of foods that contain trans fats are stick margarine, some tub  margarines, cookies, crackers, and other baked goods. WHAT GENERAL GUIDELINES DO I NEED TO FOLLOW? These guidelines for healthy eating will help you control your intake of fat and cholesterol:  Check food labels carefully to identify foods with trans fats or high amounts of saturated fat.  Fill one half of your plate with vegetables and green salads.  Fill one fourth of your plate with whole grains. Look for the word "whole" as the first word in the ingredient list.  Fill one fourth of your plate with lean protein foods.  Limit fruit to two servings a day. Choose fruit instead of juice.  Eat more foods that contain soluble fiber. Examples of foods that contain this type of fiber are apples, broccoli, carrots, beans, peas, and barley. Aim to get 20-30 g of fiber per day.  Eat more home-cooked food and less restaurant, buffet, and fast food.  Limit or avoid alcohol.  Limit foods high in starch and sugar.  Limit fried foods.  Cook foods using methods other than frying. Baking, boiling, grilling, and broiling are all great options.  Lose weight if you are overweight. Losing just 5-10% of your initial body weight can help your overall health and prevent diseases such as diabetes and heart disease. WHAT FOODS CAN I EAT? Grains Whole grains, such as whole wheat or whole grain breads, crackers, cereals, and pasta. Unsweetened oatmeal, bulgur, barley, quinoa, or brown rice. Corn or whole wheat flour tortillas. Vegetables Fresh or frozen vegetables (raw, steamed, roasted, or grilled). Green salads. Fruits All fresh, canned (in natural juice), or frozen fruits. Meat and  Other Protein Products Ground beef (85% or leaner), grass-fed beef, or beef trimmed of fat. Skinless chicken or [place]. Ground chicken or [place]. Pork trimmed of fat. All fish and seafood. Eggs. Dried beans, peas, or lentils. Unsalted nuts or seeds. Unsalted canned or dry beans. Dairy Low-fat dairy products, such as skim or  1% milk, 2% or reduced-fat cheeses, low-fat ricotta or cottage cheese, or plain low-fat yogurt. Fats and Oils Tub margarines without trans fats. Light or reduced-fat mayonnaise and salad dressings. Avocado. Olive, canola, sesame, or safflower oils. Natural peanut or almond butter (choose ones without added sugar and oil). The items listed above may not be a complete list of recommended foods or beverages. Contact your dietitian for more options. WHAT FOODS ARE NOT RECOMMENDED? Grains White bread. White pasta. White rice. Cornbread. Bagels, pastries, and croissants. Crackers that contain trans fat. Vegetables White potatoes. Corn. Creamed or fried vegetables. Vegetables in a cheese sauce. Fruits Dried fruits. Canned fruit in light or heavy syrup. Fruit juice. Meat and Other Protein Products Fatty cuts of meat. Ribs, chicken wings, bacon, sausage, bologna, salami, chitterlings, fatback, hot dogs, bratwurst, and packaged luncheon meats. Liver and organ meats. Dairy Whole or 2% milk, cream, half-and-half, and cream cheese. Whole milk cheeses. Whole-fat or sweetened yogurt. Full-fat cheeses. Nondairy creamers and whipped toppings. Processed cheese, cheese spreads, or cheese curds. Sweets and Desserts Corn syrup, sugars, honey, and molasses. Candy. Jam and jelly. Syrup. Sweetened cereals. Cookies, pies, cakes, donuts, muffins, and ice cream. Fats and Oils Butter, stick margarine, lard, shortening, ghee, or bacon fat. Coconut, palm kernel, or palm oils. Beverages Alcohol. Sweetened drinks (such as sodas, lemonade, and fruit drinks or punches). The items listed above may not be a complete list of foods and beverages to avoid. Contact your dietitian for more information.   This information is not intended to replace advice given to you by your health care provider. Make sure you discuss any questions you have with your health care provider.   Document Released: 05/01/2005 Document Revised: 05/22/2014  Document Reviewed: 07/30/2013 Elsevier Interactive Patient Education [employer]. Menopause is a normal process in which your reproductive ability comes to an end. This process happens gradually over a span of months to years, usually between the ages of 56 and 49. Menopause is complete when you have missed 12 consecutive menstrual periods. It is important to talk with your health care provider about some of the most common conditions that affect postmenopausal women, such as heart disease, cancer, and bone loss (osteoporosis). Adopting a healthy lifestyle and getting preventive care can help to promote your health and wellness. Those actions can also lower your chances of developing some of these common conditions. WHAT SHOULD I KNOW ABOUT MENOPAUSE? During menopause, you may experience a number of symptoms, such as:  Moderate-to-severe hot flashes.  Night sweats.  Decrease in sex drive.  Mood swings.  Headaches.  Tiredness.  Irritability.  Memory problems.  Insomnia. Choosing to treat or not to treat menopausal changes is an individual decision that you make with your health care provider. WHAT SHOULD I KNOW ABOUT HORMONE REPLACEMENT THERAPY AND SUPPLEMENTS? Hormone therapy products are effective for treating symptoms that are associated with menopause, such as hot flashes and night sweats. Hormone replacement carries certain risks, especially as you become older. If you are thinking about using estrogen or estrogen with progestin treatments, discuss the benefits and risks with your health care provider. WHAT SHOULD I KNOW ABOUT HEART DISEASE AND STROKE? Heart disease,  heart attack, and stroke become more likely as you age. This may be due, in part, to the hormonal changes that your body experiences during menopause. These can affect how your body processes dietary fats, triglycerides, and cholesterol. Heart attack and stroke are both medical emergencies. There are many things  that you can do to help prevent heart disease and stroke:  Have your blood pressure checked at least every 1-2 years. High blood pressure causes heart disease and increases the risk of stroke.  If you are 5-38 years old, ask your health care provider if you should take aspirin to prevent a heart attack or a stroke.  Do not use any tobacco products, including cigarettes, chewing tobacco, or electronic cigarettes. If you need help quitting, ask your health care provider.  It is important to eat a healthy diet and maintain a healthy weight.  Be sure to include plenty of vegetables, fruits, low-fat dairy products, and lean protein.  Avoid eating foods that are high in solid fats, added sugars, or salt (sodium).  Get regular exercise. This is one of the most important things that you can do for your health.  Try to exercise for at least 150 minutes each week. The type of exercise that you do should increase your heart rate and make you sweat. This is known as moderate-intensity exercise.  Try to do strengthening exercises at least twice each week. Do these in addition to the moderate-intensity exercise.  Know your numbers.Ask your health care provider to check your cholesterol and your blood glucose. Continue to have your blood tested as directed by your health care provider. WHAT SHOULD I KNOW ABOUT CANCER SCREENING? There are several types of cancer. Take the following steps to reduce your risk and to catch any cancer development as early as possible. Breast Cancer  Practice breast self-awareness.  This means understanding how your breasts normally appear and feel.  It also means doing regular breast self-exams. Let your health care provider know about any changes, no matter how small.  If you are 40 or older, have a clinician do a breast exam (clinical breast exam or CBE) every year. Depending on your age, family history, and medical history, it may be recommended that you also have a  yearly breast X-ray (mammogram).  If you have a family history of breast cancer, talk with your health care provider about genetic screening.  If you are at high risk for breast cancer, talk with your health care provider about having an MRI and a mammogram every year.  Breast cancer (BRCA) gene test is recommended for women who have family members with BRCA-related cancers. Results of the assessment will determine the need for genetic counseling and BRCA1 and for BRCA2 testing. BRCA-related cancers include these types:  Breast. This occurs in males or females.  Ovarian.  Tubal. This may also be called fallopian tube cancer.  Cancer of the abdominal or pelvic lining (peritoneal cancer).  Prostate.  Pancreatic. Cervical, Uterine, and Ovarian Cancer Your health care provider may recommend that you be screened regularly for cancer of the pelvic organs. These include your ovaries, uterus, and vagina. This screening involves a pelvic exam, which includes checking for microscopic changes to the surface of your cervix (Pap test).  For women ages 21-65, health care providers may recommend a pelvic exam and a Pap test every three years. For women ages 65-65, they may recommend the Pap test and pelvic exam, combined with testing for human papilloma virus (HPV), every five  years. Some types of HPV increase your risk of cervical cancer. Testing for HPV may also be done on women of any age who have unclear Pap test results.  Other health care providers may not recommend any screening for nonpregnant women who are considered low risk for pelvic cancer and have no symptoms. Ask your health care provider if a screening pelvic exam is right for you.  If you have had past treatment for cervical cancer or a condition that could lead to cancer, you need Pap tests and screening for cancer for at least 20 years after your treatment. If Pap tests have been discontinued for you, your risk factors (such as having a  new sexual partner) need to be reassessed to determine if you should start having screenings again. Some women have medical problems that increase the chance of getting cervical cancer. In these cases, your health care provider may recommend that you have screening and Pap tests more often.  If you have a family history of uterine cancer or ovarian cancer, talk with your health care provider about genetic screening.  If you have vaginal bleeding after reaching menopause, tell your health care provider.  There are currently no reliable tests available to screen for ovarian cancer. Lung Cancer Lung cancer screening is recommended for adults 15-72 years old who are at high risk for lung cancer because of a history of smoking. A yearly low-dose CT scan of the lungs is recommended if you:  Currently smoke.  Have a history of at least 30 pack-years of smoking and you currently smoke or have quit within the past 15 years. A pack-year is smoking an average of one pack of cigarettes per day for one year. Yearly screening should:  Continue until it has been 15 years since you quit.  Stop if you develop a health problem that would prevent you from having lung cancer treatment. Colorectal Cancer  This type of cancer can be detected and can often be prevented.  Routine colorectal cancer screening usually begins at age 76 and continues through age 8.  If you have risk factors for colon cancer, your health care provider may recommend that you be screened at an earlier age.  If you have a family history of colorectal cancer, talk with your health care provider about genetic screening.  Your health care provider may also recommend using home test kits to check for hidden blood in your stool.  A small camera at the end of a tube can be used to examine your colon directly (sigmoidoscopy or colonoscopy). This is done to check for the earliest forms of colorectal cancer.  Direct examination of the colon  should be repeated every 5-10 years until age 40. However, if early forms of precancerous polyps or small growths are found or if you have a family history or genetic risk for colorectal cancer, you may need to be screened more often. Skin Cancer  Check your skin from head to toe regularly.  Monitor any moles. Be sure to tell your health care provider:  About any new moles or changes in moles, especially if there is a change in a mole's shape or color.  If you have a mole that is larger than the size of a pencil eraser.  If any of your family members has a history of skin cancer, especially at a Curlie Sittner age, talk with your health care provider about genetic screening.  Always use sunscreen. Apply sunscreen liberally and repeatedly throughout the day.  Whenever  you are outside, protect yourself by wearing long sleeves, pants, a wide-brimmed hat, and sunglasses. WHAT SHOULD I KNOW ABOUT OSTEOPOROSIS? Osteoporosis is a condition in which bone destruction happens more quickly than new bone creation. After menopause, you may be at an increased risk for osteoporosis. To help prevent osteoporosis or the bone fractures that can happen because of osteoporosis, the following is recommended:  If you are 6-61 years old, get at least 1,000 mg of calcium and at least 600 mg of vitamin D per day.  If you are older than age 8 but younger than age 58, get at least 1,200 mg of calcium and at least 600 mg of vitamin D per day.  If you are older than age 28, get at least 1,200 mg of calcium and at least 800 mg of vitamin D per day. Smoking and excessive alcohol intake increase the risk of osteoporosis. Eat foods that are rich in calcium and vitamin D, and do weight-bearing exercises several times each week as directed by your health care provider. WHAT SHOULD I KNOW ABOUT HOW MENOPAUSE AFFECTS [place]? Depression may occur at any age, but it is more common as you become older. Common symptoms of  depression include:  Low or sad mood.  Changes in sleep patterns.  Changes in appetite or eating patterns.  Feeling an overall lack of motivation or enjoyment of activities that you previously enjoyed.  Frequent crying spells. Talk with your health care provider if you think that you are experiencing depression. WHAT SHOULD I KNOW ABOUT IMMUNIZATIONS? It is important that you get and maintain your immunizations. These include:  Tetanus, diphtheria, and pertussis (Tdap) booster vaccine.  Influenza every year before the flu season begins.  Pneumonia vaccine.  Shingles vaccine. Your health care provider may also recommend other immunizations.   This information is not intended to replace advice given to you by your health care provider. Make sure you discuss any questions you have with your health care provider.   Document Released: 06/23/2005 Document Revised: 05/22/2014 Document Reviewed: 01/01/2014 Elsevier Interactive Patient Education [employer].

## 2016-03-21 NOTE — Progress Notes (Signed)
Allison BonesSharon Contreras 06/23/1965 [id number]    History:    Presents for annual exam.  Cycles more irregular this past month had a normal cycle in May, a light spotting cycle in September. Having some increased hot flashes. Normal Pap and mammogram history. Hypertension, hypothyroidism, high triglycerides, prediabetes managed by primary care.    Past medical history, past surgical history, family history and social history were all reviewed and documented in the EPIC chart. 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 Father hypertension died from heart related issues, mother diabetes died from lymphoma.  ROS:  A ROS was performed and pertinent positives and negatives are included.  Exam:  Vitals:   03/21/16 0842  BP: 126/80  Weight: 169 lb (76.7 kg)  Height: 5\' 2"  (1.575 m)   Body mass index is 30.91 kg/m.   General appearance:  Normal Thyroid:  Symmetrical, normal in size, without palpable masses or nodularity. Respiratory  Auscultation:  Clear without wheezing or rhonchi Cardiovascular  Auscultation:  Regular rate, without rubs, murmurs or gallops  Edema/varicosities:  Not grossly evident Abdominal  Soft,nontender, without masses, guarding or rebound.  Liver/spleen:  No organomegaly noted  Hernia:  None appreciated  Skin  Inspection:  Grossly normal   Breasts: Examined lying and sitting.     Right: Without masses, retractions, discharge or axillary adenopathy.     Left: Without masses, retractions, discharge or axillary adenopathy. Gentitourinary   Inguinal/mons:  Normal without inguinal adenopathy  External genitalia:  Normal  BUS/Urethra/Skene's glands:  Normal  Vagina:  Normal  Cervix:  Normal  Uterus:   normal in size, shape and contour.  Midline and mobile  Adnexa/parametria:     Rt: Without masses or tenderness.   Lt: Without masses or tenderness.  Anus and perineum: Normal  Digital rectal exam: Normal sphincter tone without palpated masses or  tenderness  Assessment/Plan:  50 y.o. MWF G2 P2 for annual exam.    Perimenopausal cycles irregular with occasional hot flushes Hypertension/hyperthyroidism/high triglycerides/prediabetes-primary care manages labs and meds  Plan: Instructed to schedule screening colonoscopy, [name] information given. SBE's, continue annual screening mammogram, calcium rich diet, vitamin D 1000 daily encouraged. Reviewed importance of increased exercise, low-fat and low sugar diet. UA, FSH, Pap normal with negative HR HPV typing 2016, new screening guidelines reviewed. Reviewed FSH is low will withdraw with Provera.   Harrington ChallengerYOUNG,Allison Contreras WHNP, 9:02 AM 03/21/2016

## 2016-03-22 LAB — URINALYSIS W MICROSCOPIC + REFLEX CULTURE
Bacteria, UA: NONE SEEN [HPF]
Bilirubin Urine: NEGATIVE
Casts: NONE SEEN [LPF]
Crystals: NONE SEEN [HPF]
Glucose, UA: NEGATIVE
Hgb urine dipstick: NEGATIVE
Ketones, ur: NEGATIVE
Leukocytes, UA: NEGATIVE
Nitrite: NEGATIVE
Protein, ur: NEGATIVE
RBC / HPF: NONE SEEN RBC/HPF (ref <=2)
Specific Gravity, Urine: 1.004 (ref 1.001–1.035)
Squamous Epithelial / LPF: NONE SEEN [HPF] (ref <=5)
WBC, UA: NONE SEEN WBC/HPF (ref <=5)
Yeast: NONE SEEN [HPF]
pH: 6 (ref 5.0–8.0)

## 2016-03-22 LAB — FOLLICLE STIMULATING HORMONE: FSH: 23 m[IU]/mL

## 2016-03-24 ENCOUNTER — Other Ambulatory Visit: Payer: Self-pay

## 2016-03-24 DIAGNOSIS — E785 Hyperlipidemia, unspecified: Secondary | ICD-10-CM

## 2016-03-24 MED ORDER — FENOFIBRATE MICRONIZED 134 MG PO CAPS: 134.0000 mg | ORAL_CAPSULE | Freq: Every day | ORAL | 0 refills | Status: DC

## 2016-04-12 ENCOUNTER — Other Ambulatory Visit: Payer: Self-pay

## 2016-04-13 ENCOUNTER — Ambulatory Visit
Admission: RE | Admit: 2016-04-13 | Discharge: 2016-04-13 | Disposition: A | Discharge status: Home / Self Care | Payer: BLUE CROSS/BLUE SHIELD | Source: Ambulatory Visit | Attending: Women's Health | Admitting: Women's Health

## 2016-04-13 DIAGNOSIS — Z1231 Encounter for screening mammogram for malignant neoplasm of breast: Secondary | ICD-10-CM | POA: Diagnosis not present

## 2016-04-14 ENCOUNTER — Encounter: Payer: Self-pay | Admitting: Women's Health

## 2016-04-20 ENCOUNTER — Other Ambulatory Visit: Payer: BLUE CROSS/BLUE SHIELD

## 2016-04-20 DIAGNOSIS — E785 Hyperlipidemia, unspecified: Secondary | ICD-10-CM

## 2016-04-20 LAB — LIPID PANEL
Cholesterol: 172 mg/dL (ref <200)
HDL: 25 mg/dL — ABNORMAL LOW (ref >50)
LDL Cholesterol: 71 mg/dL (ref <100)
Total CHOL/HDL Ratio: 6.9 Ratio — ABNORMAL HIGH (ref <5.0)
Triglycerides: 379 mg/dL — ABNORMAL HIGH (ref <150)
VLDL: 76 mg/dL — ABNORMAL HIGH (ref <30)

## 2016-05-11 ENCOUNTER — Other Ambulatory Visit: Payer: Self-pay | Admitting: Internal Medicine

## 2016-05-30 ENCOUNTER — Ambulatory Visit (INDEPENDENT_AMBULATORY_CARE_PROVIDER_SITE_OTHER): Payer: BLUE CROSS/BLUE SHIELD | Admitting: Internal Medicine

## 2016-05-30 VITALS — BP 162/94 | HR 64 | Temp 97.7°F | Resp 16 | Ht 61.75 in | Wt 169.6 lb

## 2016-05-30 DIAGNOSIS — E039 Hypothyroidism, unspecified: Secondary | ICD-10-CM

## 2016-05-30 DIAGNOSIS — R6889 Other general symptoms and signs: Secondary | ICD-10-CM

## 2016-05-30 DIAGNOSIS — Z136 Encounter for screening for cardiovascular disorders: Secondary | ICD-10-CM | POA: Diagnosis not present

## 2016-05-30 DIAGNOSIS — E559 Vitamin D deficiency, unspecified: Secondary | ICD-10-CM | POA: Diagnosis not present

## 2016-05-30 DIAGNOSIS — E782 Mixed hyperlipidemia: Secondary | ICD-10-CM

## 2016-05-30 DIAGNOSIS — I10 Essential (primary) hypertension: Secondary | ICD-10-CM | POA: Diagnosis not present

## 2016-05-30 DIAGNOSIS — Z79899 Other long term (current) drug therapy: Secondary | ICD-10-CM | POA: Diagnosis not present

## 2016-05-30 DIAGNOSIS — Z0001 Encounter for general adult medical examination with abnormal findings: Secondary | ICD-10-CM | POA: Diagnosis not present

## 2016-05-30 DIAGNOSIS — R5383 Other fatigue: Secondary | ICD-10-CM

## 2016-05-30 DIAGNOSIS — J40 Bronchitis, not specified as acute or chronic: Secondary | ICD-10-CM

## 2016-05-30 DIAGNOSIS — R7303 Prediabetes: Secondary | ICD-10-CM

## 2016-05-30 DIAGNOSIS — Z1211 Encounter for screening for malignant neoplasm of colon: Secondary | ICD-10-CM

## 2016-05-30 LAB — CBC WITH DIFFERENTIAL/PLATELET
Basophils Absolute: 0 cells/uL (ref 0–200)
Basophils Relative: 0 %
Eosinophils Absolute: 115 cells/uL (ref 15–500)
Eosinophils Relative: 1 %
HCT: 43.4 % (ref 35.0–45.0)
Hemoglobin: 14.9 g/dL (ref 11.7–15.5)
Lymphocytes Relative: 22 %
Lymphs Abs: 2530 cells/uL (ref 850–3900)
MCH: 30.6 pg (ref 27.0–33.0)
MCHC: 34.3 g/dL (ref 32.0–36.0)
MCV: 89.1 fL (ref 80.0–100.0)
MPV: 10 fL (ref 7.5–12.5)
Monocytes Absolute: 805 cells/uL (ref 200–950)
Monocytes Relative: 7 %
Neutro Abs: 8050 cells/uL — ABNORMAL HIGH (ref 1500–7800)
Neutrophils Relative %: 70 %
Platelets: 286 10*3/uL (ref 140–400)
RBC: 4.87 MIL/uL (ref 3.80–5.10)
RDW: 14.4 % (ref 11.0–15.0)
WBC: 11.5 10*3/uL — ABNORMAL HIGH (ref 3.8–10.8)

## 2016-05-30 LAB — HEMOGLOBIN A1C
Hgb A1c MFr Bld: 5.5 % (ref <5.7)
Mean Plasma Glucose: 111 mg/dL

## 2016-05-30 LAB — VITAMIN B12: Vitamin B-12: 564 pg/mL (ref 200–1100)

## 2016-05-30 LAB — TSH: TSH: 3.29 mIU/L

## 2016-05-30 MED ORDER — AZITHROMYCIN 250 MG PO TABS: ORAL_TABLET | ORAL | 1 refills | Status: DC

## 2016-05-30 MED ORDER — PREDNISONE 20 MG PO TABS: ORAL_TABLET | ORAL | 0 refills | Status: DC

## 2016-05-30 NOTE — Patient Instructions (Signed)

## 2016-05-30 NOTE — Progress Notes (Signed)
[facility] Allison Contreras, M.D.    [name], P.A.-C      [name], P.A.-C  [facility]                [address]                [place], [place] [id number] Telephone [phone]) [phone] Telefax [phone]  Annual Screening/Preventative Visit & Comprehensive Evaluation &  Examination     This very nice 51 y.o. MWF presents for a Screening/Preventative Visit & comprehensive evaluation and management of multiple medical co-morbidities.  Patient has been followed for HTN, Prediabetes, Hyperlipidemia, Hypothyroidism  and Vitamin D Deficiency.      HTN predates since 2000. Patient's BP has been controlled at home and patient denies any cardiac symptoms as chest pain, palpitations, shortness of breath, dizziness or ankle swelling. Today's BP is elevated at 162/94, altho she reports random home BP's have been in normal range.       Patient's hyperlipidemia is controlled with diet and medications. Patient denies myalgias or other medication SE's. Current  lipids are not at goal with very elevated Trig's: Lab Results  Component Value Date   CHOL 224 (H) 05/30/2016   HDL 18 (L) 05/30/2016   LDLCALC NOT CALC 05/30/2016   TRIG 1,682 (H) 05/30/2016   CHOLHDL 12.4 (H) 05/30/2016      Patient has Morbid Obesity (BMI 31+) and consequent  prediabetes predating since 2015 with elevated A1c 5.7% and Insulin "41". Patient denies reactive hypoglycemic symptoms, visual blurring, diabetic polys, or paresthesias. Current  A1c is t at goal: Lab Results  Component Value Date   HGBA1C 5.5 05/30/2016      Patient has been on Thyroid replacement 1997. Finally, patient has history of Vitamin D Deficiency in 2013 of "20" and "16" in 2014 and as patient does not supplement as recommended, her current  Vitamin D is still very low: Lab Results  Component Value Date   VD25OH 28 (L) 05/30/2016   Current Outpatient Prescriptions on File  Prior to Visit  Medication Sig  . bisoprolol-hydrochlorothiazide (ZIAC) 10-6.25 MG tablet TAKE ONE TABLET BY MOUTH ONCE DAILY  . fenofibrate micronized (LOFIBRA) 134 MG capsule Take 1 capsule (134 mg total) by mouth daily before breakfast.  . levothyroxine (SYNTHROID, LEVOTHROID) 112 MCG tablet TAKE 1 TABLET EVERY DAY BEFORE BREAKFAST   No current facility-administered medications on file prior to visit.    Allergies  Allergen Reactions  . Codeine   . Ppd [Tuberculin Purified Protein Derivative]     Positive reaction 2001   Past Medical History:  Diagnosis Date  . Hypertension   . Hypothyroidism    Health Maintenance  Topic Date Due  . HIV Screening  01/20/1981  . COLONOSCOPY  01/21/2016  . PAP SMEAR  03/22/2016  . INFLUENZA VACCINE  04/23/2017 (Originally 12/14/2015)  . MAMMOGRAM  04/13/2017  . TETANUS/TDAP  08/01/2022   Immunization History  Administered Date(s) Administered  . Td 06/29/2001, 07/31/2012  . Tdap 07/31/2012   Past Surgical History:  Procedure Laterality Date  . LYMPH NODE DISSECTION     NECK- BENIGN   Family History  Problem Relation Age of Onset  . Cancer Mother     NON HODGKINS LYMPHOMA  . Hypertension Mother   . Diabetes Mother   . Hyperlipidemia Mother   . Hypertension Father   . Heart disease Father    Social History  Substance Use Topics  .  Smoking status: Never Smoker  . Smokeless tobacco: Never Used  . Alcohol use Yes     Comment: occ.    ROS Constitutional: Denies fever, chills, weight loss/gain, headaches, insomnia,  night sweats, and change in appetite. Does c/o fatigue. Eyes: Denies redness, blurred vision, diplopia, discharge, itchy, watery eyes.  ENT: Denies discharge, congestion, post nasal drip, epistaxis, sore throat, earache, hearing loss, dental pain, Tinnitus, Vertigo, Sinus pain, snoring.  Cardio: Denies chest pain, palpitations, irregular heartbeat, syncope, dyspnea, diaphoresis, orthopnea, PND, claudication,  edema Respiratory: denies cough, dyspnea, DOE, pleurisy, hoarseness, laryngitis, wheezing.  Gastrointestinal: Denies dysphagia, heartburn, reflux, water brash, pain, cramps, nausea, vomiting, bloating, diarrhea, constipation, hematemesis, melena, hematochezia, jaundice, hemorrhoids Genitourinary: Denies dysuria, frequency, urgency, nocturia, hesitancy, discharge, hematuria, flank pain Breast: Breast lumps, nipple discharge, bleeding.  Musculoskeletal: Denies arthralgia, myalgia, stiffness, Jt. Swelling, pain, limp, and strain/sprain. Denies falls. Skin: Denies puritis, rash, hives, warts, acne, eczema, changing in skin lesion Neuro: No weakness, tremor, incoordination, spasms, paresthesia, pain Psychiatric: Denies confusion, memory loss, sensory loss. Denies Depression. Endocrine: Denies change in weight, skin, hair change, nocturia, and paresthesia, diabetic polys, visual blurring, hyper / hypo glycemic episodes.  Heme/Lymph: No excessive bleeding, bruising, enlarged lymph nodes.  Physical Exam  BP (!) 162/94   Pulse 64   Temp 97.7 F (36.5 C)   Resp 16   Ht 5' 1.75" (1.568 m)   Wt 169 lb 9.6 oz (76.9 kg)   BMI 31.27 kg/m   General Appearance: Over nourished and in no apparent distress. Dry tracheal cough   Eyes: PERRLA, EOMs, conjunctiva no swelling or erythema, normal fundi and vessels. Sinuses: No frontal/maxillary tenderness ENT/Mouth: EACs patent / TMs  nl. Nares clear without erythema, swelling, mucoid exudates. Oral hygiene is good. No erythema, swelling, or exudate. Tongue normal, non-obstructing. Tonsils not swollen or erythematous. Hearing normal.  Neck: Supple, thyroid normal. No bruits, nodes or JVD. Respiratory: Respiratory effort normal.  BS equal and clear bilateral without rales, rhonci, wheezing or stridor. Cardio: Heart sounds are normal with regular rate and rhythm and no murmurs, rubs or gallops. Peripheral pulses are normal and equal bilaterally without edema. No  aortic or femoral bruits. Chest: Scattered coarse rales & rhonchi w/o wheezes.  Breasts: Symmetric, without lumps, nipple discharge, retractions, or fibrocystic changes.  Abdomen: Flat, soft with bowel sounds active. Nontender, no guarding, rebound, hernias, masses, or organomegaly.  Lymphatics: Non tender without lymphadenopathy.  Musculoskeletal: Full ROM all peripheral extremities, joint stability, 5/5 strength, and normal gait. Skin: Warm and dry without rashes, lesions, cyanosis, clubbing or  ecchymosis.  Neuro: Cranial nerves intact, reflexes equal bilaterally. Normal muscle tone, no cerebellar symptoms. Sensation intact.  Pysch: Alert and oriented X 3, normal affect, Insight and Judgment appropriate.   Assessment and Plan  1. Annual Preventative Screening Examination   2. Essential hypertension  - Microalbumin / creatinine urine ratio - EKG 12-Lead - [place], RETROPERITNL ABD,  LTD - Urinalysis, Routine w reflex microscopic - TSH  3. Mixed hyperlipidemia  - EKG 12-Lead - [place], RETROPERITNL ABD,  LTD - Lipid panel - TSH  4. Prediabetes  - EKG 12-Lead - [place], RETROPERITNL ABD,  LTD - Hemoglobin A1c - Insulin, random  5. Vitamin D deficiency  - VITAMIN D 25 Hydroxy  6. Hypothyroidism, unspecified type   7. Screening for ischemic heart disease  - EKG 12-Lead  8. Screening for AAA (aortic abdominal aneurysm)  - [place], RETROPERITNL ABD,  LTD  9. Other fatigue  - Vitamin B12 - Iron  and TIBC - CBC with Differential/Platelet - TSH  10. Colon cancer screening  - POC Hemoccult Bld/Stl   11. Tracheobronchitis  - predniSONE (DELTASONE) 20 MG tablet; 1 tab 3 x day for 3 days, then 1 tab 2 x day for 3 days, then 1 tab 1 x day for 5 days  Dispense: 20 tablet; Refill: 0  - azithromycin (ZITHROMAX) 250 MG tablet; Take 2 tablets (500 mg) on  Day 1,  followed by 1 tablet (250 mg) once daily on Days 2 through 5.  Dispense: 6 each; Refill: 1  12. Medication management  -  Urinalysis, Routine w reflex microscopic - CBC with Differential/Platelet - BASIC METABOLIC PANEL WITH GFR - Hepatic function panel - Magnesium       Continue prudent diet as discussed, weight control, BP monitoring, regular exercise, and medications. Discussed med's effects and SE's. Screening labs and tests as requested with regular follow-up as recommended. Over 40 minutes of exam, counseling, chart review and high complex critical decision making was performed.

## 2016-05-31 ENCOUNTER — Other Ambulatory Visit: Payer: Self-pay | Admitting: Internal Medicine

## 2016-05-31 DIAGNOSIS — E782 Mixed hyperlipidemia: Secondary | ICD-10-CM

## 2016-05-31 LAB — BASIC METABOLIC PANEL WITH GFR
BUN: 9 mg/dL (ref 7–25)
CO2: 22 mmol/L (ref 20–31)
Calcium: 9.7 mg/dL (ref 8.6–10.4)
Chloride: 101 mmol/L (ref 98–110)
Creat: 0.77 mg/dL (ref 0.50–1.05)
GFR, Est African American: >89 mL/min (ref >=60)
GFR, Est Non African American: >89 mL/min (ref >=60)
Glucose, Bld: 117 mg/dL — ABNORMAL HIGH (ref 65–99)
Potassium: 4.3 mmol/L (ref 3.5–5.3)
Sodium: 137 mmol/L (ref 135–146)

## 2016-05-31 LAB — HEPATIC FUNCTION PANEL
ALT: 22 U/L (ref 6–29)
AST: 27 U/L (ref 10–35)
Albumin: 4.4 g/dL (ref 3.6–5.1)
Alkaline Phosphatase: 61 U/L (ref 33–130)
Bilirubin, Direct: 0.1 mg/dL (ref <=0.2)
Indirect Bilirubin: 0.2 mg/dL (ref 0.2–1.2)
Total Bilirubin: 0.3 mg/dL (ref 0.2–1.2)
Total Protein: 7.4 g/dL (ref 6.1–8.1)

## 2016-05-31 LAB — URINALYSIS, ROUTINE W REFLEX MICROSCOPIC
Bilirubin Urine: NEGATIVE
Glucose, UA: NEGATIVE
Hgb urine dipstick: NEGATIVE
Ketones, ur: NEGATIVE
Leukocytes, UA: NEGATIVE
Nitrite: NEGATIVE
Protein, ur: NEGATIVE
Specific Gravity, Urine: 1.007 (ref 1.001–1.035)
pH: 5.5 (ref 5.0–8.0)

## 2016-05-31 LAB — LIPID PANEL
Cholesterol: 224 mg/dL — ABNORMAL HIGH (ref <200)
HDL: 18 mg/dL — ABNORMAL LOW (ref >50)
Total CHOL/HDL Ratio: 12.4 Ratio — ABNORMAL HIGH (ref <5.0)
Triglycerides: 1682 mg/dL — ABNORMAL HIGH (ref <150)

## 2016-05-31 LAB — MICROALBUMIN / CREATININE URINE RATIO
Creatinine, Urine: 25 mg/dL (ref 20–320)
Microalb Creat Ratio: 8 mcg/mg creat (ref <30)
Microalb, Ur: 0.2 mg/dL

## 2016-05-31 LAB — INSULIN, RANDOM: Insulin: 15.6 u[IU]/mL (ref 2.0–19.6)

## 2016-05-31 LAB — IRON AND TIBC
%SAT: 6 % — ABNORMAL LOW (ref 11–50)
Iron: 19 ug/dL — ABNORMAL LOW (ref 45–160)
TIBC: 344 ug/dL (ref 250–450)
UIBC: 325 ug/dL (ref 125–400)

## 2016-05-31 LAB — VITAMIN D 25 HYDROXY (VIT D DEFICIENCY, FRACTURES): Vit D, 25-Hydroxy: 28 ng/mL — ABNORMAL LOW (ref 30–100)

## 2016-05-31 LAB — MAGNESIUM: Magnesium: 2.4 mg/dL (ref 1.5–2.5)

## 2016-06-02 ENCOUNTER — Encounter: Payer: Self-pay | Admitting: Internal Medicine

## 2016-07-22 ENCOUNTER — Other Ambulatory Visit: Payer: Self-pay | Admitting: Physician Assistant

## 2016-07-22 ENCOUNTER — Other Ambulatory Visit: Payer: Self-pay | Admitting: Internal Medicine

## 2016-07-22 DIAGNOSIS — E785 Hyperlipidemia, unspecified: Secondary | ICD-10-CM

## 2016-08-31 NOTE — Progress Notes (Signed)
Assessment and Plan:  Essential hypertension - continue medications, DASH diet, exercise and monitor at home. Call if greater than 130/80.  -     CBC with Differential/Platelet -     BASIC METABOLIC PANEL WITH GFR -     Hepatic function panel  Hyperlipidemia, unspecified hyperlipidemia type -continue medications, check lipids, decrease fatty foods, increase activity.  - on fenofibrate daily, still needs to stop sweet tea, recheck 3 months -     fenofibrate micronized (LOFIBRA) 134 MG capsule; Take 1 capsule (134 mg total) by mouth daily before breakfast. -     Lipid panel  Hypothyroidism, unspecified type Hypothyroidism-check TSH level, continue medications the same, reminded to take on an empty stomach 30-28mins before food.  -     TSH  Morbid obesity (BMI 30.94) - long discussion about weight loss, diet, and exercise  Vitamin D deficiency -     VITAMIN D 25 Hydroxy (Vit-D Deficiency, Fractures)  Medication management -     Magnesium  Continue diet and meds as discussed. Further disposition pending results of labs.  HPI 51 y.o. female  presents for 6 month follow up  has a past medical history of Hypertension and Hypothyroidism.  Her blood pressure has been controlled at home, today their BP is BP: 134/70  She does not workout. She denies chest pain, shortness of breath, dizziness.  She is on cholesterol medication, fenofibrate but admits to not taking it daily, she also drinks sweet tea and denies myalgias. Her cholesterol is not at goal. The cholesterol last visit was:   Lab Results  Component Value Date   CHOL 224 (H) 05/30/2016   HDL 18 (L) 05/30/2016   LDLCALC NOT CALC 05/30/2016   TRIG 1,682 (H) 05/30/2016   CHOLHDL 12.4 (H) 05/30/2016    She has been working on diet and exercise for prediabetes, and denies paresthesia of the feet, polydipsia, polyuria and visual disturbances. Last A1C in the office was:  Lab Results  Component Value Date   HGBA1C 5.5 05/30/2016    Patient is on Vitamin D supplement.   Lab Results  Component Value Date   VD25OH 28 (L) 05/30/2016     BMI is Body mass index is 31.31 kg/m., she is working on diet and exercise. Wt Readings from Last 3 Encounters:  09/01/16 169 lb 12.8 oz (77 kg)  05/30/16 169 lb 9.6 oz (76.9 kg)  03/21/16 169 lb (76.7 kg)   She is on thyroid medication. Her medication was not changed last visit.   Lab Results  Component Value Date   TSH 3.29 05/30/2016  .   Current Medications:  Current Outpatient Prescriptions on File Prior to Visit  Medication Sig Dispense Refill  . bisoprolol-hydrochlorothiazide (ZIAC) 10-6.25 MG tablet TAKE ONE TABLET BY MOUTH ONCE DAILY 90 tablet 1  . fenofibrate micronized (LOFIBRA) 134 MG capsule TAKE 1 CAPSULE BY MOUTH DAILY BEFORE BREAKFAST. 90 capsule 1  . levothyroxine (SYNTHROID, LEVOTHROID) 112 MCG tablet TAKE 1 TABLET EVERY DAY BEFORE BREAKFAST 90 tablet 1   No current facility-administered medications on file prior to visit.    Medical History:  Past Medical History:  Diagnosis Date  . Hypertension   . Hypothyroidism    Allergies:  Allergies  Allergen Reactions  . Codeine   . Ppd [Tuberculin Purified Protein Derivative]     Positive reaction 2001     Review of Systems:  Review of Systems  Constitutional: Negative.   HENT: Negative.   Eyes: Negative.  Respiratory: Negative.   Cardiovascular: Negative.   Gastrointestinal: Negative.   Genitourinary: Negative.   Musculoskeletal: Negative.   Skin: Negative.   Neurological: Negative.   Endo/Heme/Allergies: Negative.   Psychiatric/Behavioral: Negative.     Family history- Review and unchanged Social history- Review and unchanged Physical Exam: BP 134/70   Pulse 60   Temp 98.1 F (36.7 C)   Resp 16   Ht 5' 1.75" (1.568 m)   Wt 169 lb 12.8 oz (77 kg)   SpO2 97%   BMI 31.31 kg/m  Wt Readings from Last 3 Encounters:  09/01/16 169 lb 12.8 oz (77 kg)  05/30/16 169 lb 9.6 oz (76.9 kg)   03/21/16 169 lb (76.7 kg)   General Appearance: Well nourished, in no apparent distress. Eyes: PERRLA, EOMs, conjunctiva no swelling or erythema Sinuses: No Frontal/maxillary tenderness ENT/Mouth: Ext aud canals clear, TMs without erythema, bulging. No erythema, swelling, or exudate on post pharynx.  Tonsils not swollen or erythematous. Hearing normal.  Neck: Supple, thyroid normal.  Respiratory: Respiratory effort normal, BS equal bilaterally without rales, rhonchi, wheezing or stridor.  Cardio: RRR with no MRGs. Brisk peripheral pulses without edema.  Abdomen: Soft, + BS,  Non tender, no guarding, rebound, hernias, masses. Lymphatics: Non tender without lymphadenopathy.  Musculoskeletal: Full ROM, 5/5 strength, Normal gait Skin: Warm, dry without rashes, lesions, ecchymosis.  Neuro: Cranial nerves intact. Normal muscle tone, no cerebellar symptoms. Psych: Awake and oriented X 3, normal affect, Insight and Judgment appropriate.    Quentin Mulling, PA-C 9:13 AM [facility]

## 2016-09-01 ENCOUNTER — Ambulatory Visit: Payer: Self-pay | Admitting: Internal Medicine

## 2016-09-01 ENCOUNTER — Ambulatory Visit (INDEPENDENT_AMBULATORY_CARE_PROVIDER_SITE_OTHER): Payer: BLUE CROSS/BLUE SHIELD | Admitting: Physician Assistant

## 2016-09-01 ENCOUNTER — Encounter: Payer: Self-pay | Admitting: Physician Assistant

## 2016-09-01 VITALS — BP 134/70 | HR 60 | Temp 98.1°F | Resp 16 | Ht 61.75 in | Wt 169.8 lb

## 2016-09-01 DIAGNOSIS — E782 Mixed hyperlipidemia: Secondary | ICD-10-CM

## 2016-09-01 DIAGNOSIS — I10 Essential (primary) hypertension: Secondary | ICD-10-CM

## 2016-09-01 DIAGNOSIS — Z79899 Other long term (current) drug therapy: Secondary | ICD-10-CM

## 2016-09-01 DIAGNOSIS — R7303 Prediabetes: Secondary | ICD-10-CM

## 2016-09-01 DIAGNOSIS — E039 Hypothyroidism, unspecified: Secondary | ICD-10-CM | POA: Diagnosis not present

## 2016-09-01 DIAGNOSIS — E559 Vitamin D deficiency, unspecified: Secondary | ICD-10-CM | POA: Diagnosis not present

## 2016-09-01 LAB — CBC WITH DIFFERENTIAL/PLATELET
Basophils Absolute: 0 cells/uL (ref 0–200)
Basophils Relative: 0 %
Eosinophils Absolute: 81 cells/uL (ref 15–500)
Eosinophils Relative: 1 %
HCT: 41.9 % (ref 35.0–45.0)
Hemoglobin: 14 g/dL (ref 11.7–15.5)
Lymphocytes Relative: 33 %
Lymphs Abs: 2673 cells/uL (ref 850–3900)
MCH: 29.7 pg (ref 27.0–33.0)
MCHC: 33.4 g/dL (ref 32.0–36.0)
MCV: 89 fL (ref 80.0–100.0)
MPV: 9.4 fL (ref 7.5–12.5)
Monocytes Absolute: 567 cells/uL (ref 200–950)
Monocytes Relative: 7 %
Neutro Abs: 4779 cells/uL (ref 1500–7800)
Neutrophils Relative %: 59 %
Platelets: 277 10*3/uL (ref 140–400)
RBC: 4.71 MIL/uL (ref 3.80–5.10)
RDW: 14.2 % (ref 11.0–15.0)
WBC: 8.1 10*3/uL (ref 3.8–10.8)

## 2016-09-01 LAB — BASIC METABOLIC PANEL WITH GFR
BUN: 12 mg/dL (ref 7–25)
CO2: 28 mmol/L (ref 20–31)
Calcium: 10 mg/dL (ref 8.6–10.4)
Chloride: 102 mmol/L (ref 98–110)
Creat: 0.91 mg/dL (ref 0.50–1.05)
GFR, Est African American: 85 mL/min (ref >=60)
GFR, Est Non African American: 74 mL/min (ref >=60)
Glucose, Bld: 129 mg/dL — ABNORMAL HIGH (ref 65–99)
Potassium: 4.3 mmol/L (ref 3.5–5.3)
Sodium: 138 mmol/L (ref 135–146)

## 2016-09-01 LAB — HEPATIC FUNCTION PANEL
ALT: 47 U/L — ABNORMAL HIGH (ref 6–29)
AST: 30 U/L (ref 10–35)
Albumin: 4.6 g/dL (ref 3.6–5.1)
Alkaline Phosphatase: 56 U/L (ref 33–130)
Bilirubin, Direct: 0.1 mg/dL (ref <=0.2)
Indirect Bilirubin: 0.4 mg/dL (ref 0.2–1.2)
Total Bilirubin: 0.5 mg/dL (ref 0.2–1.2)
Total Protein: 7.4 g/dL (ref 6.1–8.1)

## 2016-09-01 LAB — TSH: TSH: 1.29 mIU/L

## 2016-09-01 LAB — LIPID PANEL
Cholesterol: 184 mg/dL (ref <200)
HDL: 25 mg/dL — ABNORMAL LOW (ref >50)
Total CHOL/HDL Ratio: 7.4 Ratio — ABNORMAL HIGH (ref <5.0)
Triglycerides: 457 mg/dL — ABNORMAL HIGH (ref <150)

## 2016-09-01 LAB — MAGNESIUM: Magnesium: 2.1 mg/dL (ref 1.5–2.5)

## 2016-09-01 NOTE — Patient Instructions (Signed)
Drink 80-100 oz of water a day, measure it out DO NOT DRINK YOUR CALORIES, NO SWEET TEA!   Simple math prevails.    1st - exercise does not produce significant weight loss - at best one converts fat into muscle , "bulks up", loses inches, but usually stays "weight neutral"     2nd - think of your body weightas a check book: If you Allison more calories than you burn up - you save money or gain weight .... Or if you spend more money than you put in Allison check book, ie burn up more calories than you Allison, then you lose weight     3rd - if you walk or run 1 mile, you burn up 100 calories - you have to burn up 3,500 calories to lose 1 pound, ie you have to walk/run 35 miles to lose 1 measly pound. So if you want to lose 10 #, then you have to walk/run 350 miles, so.... clearly exercise is not Allison solution.     4. So if you consume 1,500 calories, then you have to burn up Allison equivalent of 15 miles to stay weight neutral - It also stands to reason that if you consume 1,500 cal/day and don't lose weight, then you must be burning up about 1,500 cals/day to stay weight neutral.     5. If you really want to lose weight, you must cut your calorie intake 300 calories /day and at that rate you should lose about 1 # every 3 days.   6. Please purchase Allison Contreras's book(s) "Allison Contreras" & "Allison Contreras" . It has some great concepts and recipes.      We want weight loss that will last so you should lose 1-2 pounds a week.  THAT IS IT! Please pick THREE things a month to change. Once it is a habit check off Allison item. Then pick another three items off Allison list to become habits.  If you are already doing a habit on Allison list GREAT!  Cross that item off! o Don't drink your calories. Ie, alcohol, soda, fruit juice, and sweet tea.  o Drink more water. Drink a glass when you feel hungry or before each meal.  o Allison breakfast - Complex carb and protein (likeDannon light and fit yogurt, oatmeal, fruit, eggs,  [place] bacon). o Measure your cereal.  Allison no more than one cup a day. (ie [place]) o Allison an apple a day. o Add a vegetable a day. o Try a new vegetable a month. o Use Pam! Stop using oil or butter to cook. o Don't finish your plate or use smaller plates. o Share your dessert. o Allison sugar free Jello for dessert or frozen grapes. o Don't Allison 2-3 hours before bed. o Switch to whole wheat bread, pasta, and brown rice. o Make healthier choices when you Allison out. No fries! o Pick baked chicken, NOT fried. o Don't forget to SLOW DOWN when you Allison. It is not going anywhere.  o Take Allison stairs. o Park far away in Allison parking lot o [employer] (or weights) for 10 minutes while watching TV. o Walk at work for 10 minutes during break. o Walk outside 1 time a week with your friend, kids, dog, or significant other. o Start a walking group at church. o Walk Allison mall as much as you can tolerate.  o Keep a food diary. o Weigh yourself daily. o Walk for 15 minutes  3 days per week. o Cook at home more often and Allison out less.  If life happens and you go back to old habits, it is okay.  Just start over. You can do it!   If you experience chest pain, get short of breath, or tired during Allison exercise, please stop immediately and inform your doctor.

## 2016-09-02 LAB — HEMOGLOBIN A1C
Hgb A1c MFr Bld: 6.2 % — ABNORMAL HIGH (ref <5.7)
Mean Plasma Glucose: 131 mg/dL

## 2016-09-02 LAB — VITAMIN D 25 HYDROXY (VIT D DEFICIENCY, FRACTURES): Vit D, 25-Hydroxy: 33 ng/mL (ref 30–100)

## 2016-09-08 NOTE — Progress Notes (Signed)
Pt aware of lab results & voiced understanding of those results.

## 2016-10-26 ENCOUNTER — Other Ambulatory Visit: Payer: Self-pay | Admitting: Internal Medicine

## 2016-12-06 ENCOUNTER — Ambulatory Visit (INDEPENDENT_AMBULATORY_CARE_PROVIDER_SITE_OTHER): Payer: BLUE CROSS/BLUE SHIELD | Admitting: Internal Medicine

## 2016-12-06 ENCOUNTER — Encounter: Payer: Self-pay | Admitting: Internal Medicine

## 2016-12-06 VITALS — BP 182/100 | HR 68 | Temp 97.5°F | Resp 18 | Ht 61.75 in | Wt 168.2 lb

## 2016-12-06 DIAGNOSIS — R7303 Prediabetes: Secondary | ICD-10-CM

## 2016-12-06 DIAGNOSIS — E559 Vitamin D deficiency, unspecified: Secondary | ICD-10-CM

## 2016-12-06 DIAGNOSIS — I10 Essential (primary) hypertension: Secondary | ICD-10-CM

## 2016-12-06 DIAGNOSIS — E039 Hypothyroidism, unspecified: Secondary | ICD-10-CM | POA: Diagnosis not present

## 2016-12-06 DIAGNOSIS — Z79899 Other long term (current) drug therapy: Secondary | ICD-10-CM | POA: Diagnosis not present

## 2016-12-06 DIAGNOSIS — E782 Mixed hyperlipidemia: Secondary | ICD-10-CM | POA: Diagnosis not present

## 2016-12-06 LAB — CBC WITH DIFFERENTIAL/PLATELET
Basophils Absolute: 0 cells/uL (ref 0–200)
Basophils Relative: 0 %
Eosinophils Absolute: 152 cells/uL (ref 15–500)
Eosinophils Relative: 2 %
HCT: 40.6 % (ref 35.0–45.0)
Hemoglobin: 13.5 g/dL (ref 11.7–15.5)
Lymphocytes Relative: 29 %
Lymphs Abs: 2204 cells/uL (ref 850–3900)
MCH: 29.3 pg (ref 27.0–33.0)
MCHC: 33.3 g/dL (ref 32.0–36.0)
MCV: 88.1 fL (ref 80.0–100.0)
MPV: 9.5 fL (ref 7.5–12.5)
Monocytes Absolute: 608 cells/uL (ref 200–950)
Monocytes Relative: 8 %
Neutro Abs: 4636 cells/uL (ref 1500–7800)
Neutrophils Relative %: 61 %
Platelets: 252 10*3/uL (ref 140–400)
RBC: 4.61 MIL/uL (ref 3.80–5.10)
RDW: 13.8 % (ref 11.0–15.0)
WBC: 7.6 10*3/uL (ref 3.8–10.8)

## 2016-12-06 LAB — BASIC METABOLIC PANEL WITH GFR
BUN: 9 mg/dL (ref 7–25)
CO2: 22 mmol/L (ref 20–31)
Calcium: 9.2 mg/dL (ref 8.6–10.4)
Chloride: 105 mmol/L (ref 98–110)
Creat: 0.79 mg/dL (ref 0.50–1.05)
GFR, Est African American: >89 mL/min (ref >=60)
GFR, Est Non African American: 88 mL/min (ref >=60)
Glucose, Bld: 113 mg/dL — ABNORMAL HIGH (ref 65–99)
Potassium: 4 mmol/L (ref 3.5–5.3)
Sodium: 137 mmol/L (ref 135–146)

## 2016-12-06 LAB — HEPATIC FUNCTION PANEL
ALT: 30 U/L — ABNORMAL HIGH (ref 6–29)
AST: 20 U/L (ref 10–35)
Albumin: 4.5 g/dL (ref 3.6–5.1)
Alkaline Phosphatase: 52 U/L (ref 33–130)
Bilirubin, Direct: 0.1 mg/dL (ref <=0.2)
Indirect Bilirubin: 0.2 mg/dL (ref 0.2–1.2)
Total Bilirubin: 0.3 mg/dL (ref 0.2–1.2)
Total Protein: 7.2 g/dL (ref 6.1–8.1)

## 2016-12-06 LAB — TSH: TSH: 1.1 mIU/L

## 2016-12-06 LAB — LIPID PANEL
Cholesterol: 177 mg/dL (ref <200)
HDL: 25 mg/dL — ABNORMAL LOW (ref >50)
Total CHOL/HDL Ratio: 7.1 Ratio — ABNORMAL HIGH (ref <5.0)
Triglycerides: 423 mg/dL — ABNORMAL HIGH (ref <150)

## 2016-12-06 NOTE — Patient Instructions (Signed)

## 2016-12-06 NOTE — Progress Notes (Signed)
This very nice 51 y.o. MWF presents for 3 month follow up with Hypertension, Hyperlipidemia, Pre-Diabetes, Hypothyroidism and Vitamin D Deficiency.      Patient is treated for HTN (2000)  & BP has been controlled at home. Today's BP was elevated at 182/100 and rechecked at 175/92. Patient relates Home BP's range 120-130's.  Patient has had no complaints of any cardiac type chest pain, palpitations, dyspnea/orthopnea/PND, dizziness, claudication, or dependent edema.     Hyperlipidemia is not totally controlled with diet & meds. Patient denies myalgias or other med SE's. Last Lipids were not at goal with Nl Chol 184, but elevated Trig 457 ( previous Trig was 1682 in Jan 2017 and started on fenofibrate): Lab Results  Component Value Date   CHOL 184 09/01/2016   HDL 25 (L) 09/01/2016   LDLCALC NOT CALC 09/01/2016   TRIG 457 (H) 09/01/2016   CHOLHDL 7.4 (H) 09/01/2016      Also, the patient has history of PreDiabetes (A1c 5.7% in 2015) and has had no symptoms of reactive hypoglycemia, diabetic polys, paresthesias or visual blurring.  Last A1c was  Lab Results  Component Value Date   HGBA1C 6.2 (H) 09/01/2016      Patient has been on thyroid replacement since 1997. Further, the patient also has history of Vitamin D Deficiency ("20" in 2013 and "16" in 2014) and does not supplement as repeatedly recommended.  Last vitamin D was still not at goal:  Lab Results  Component Value Date   VD25OH 33 09/01/2016   Current Outpatient Prescriptions on File Prior to Visit  Medication Sig  . bisoprolol-hydrochlorothiazide (ZIAC) 10-6.25 MG tablet TAKE ONE TABLET BY MOUTH ONCE DAILY  . fenofibrate micronized (LOFIBRA) 134 MG capsule TAKE 1 CAPSULE BY MOUTH DAILY BEFORE BREAKFAST.  Marland Kitchen. levothyroxine (SYNTHROID, LEVOTHROID) 112 MCG tablet TAKE 1 TABLET EVERY DAY BEFORE BREAKFAST   No current facility-administered medications on file prior to visit.    Allergies  Allergen Reactions  . Codeine   . Ppd  [Tuberculin Purified Protein Derivative]     Positive reaction 2001   PMHx:   Past Medical History:  Diagnosis Date  . Hypertension   . Hypothyroidism    Immunization History  Administered Date(s) Administered  . Td 06/29/2001, 07/31/2012  . Tdap 07/31/2012   Past Surgical History:  Procedure Laterality Date  . LYMPH NODE DISSECTION     NECK- BENIGN   FHx:    Reviewed / unchanged  SHx:    Reviewed / unchanged  Systems Review:  Constitutional: Denies fever, chills, wt changes, headaches, insomnia, fatigue, night sweats, change in appetite. Eyes: Denies redness, blurred vision, diplopia, discharge, itchy, watery eyes.  ENT: Denies discharge, congestion, post nasal drip, epistaxis, sore throat, earache, hearing loss, dental pain, tinnitus, vertigo, sinus pain, snoring.  CV: Denies chest pain, palpitations, irregular heartbeat, syncope, dyspnea, diaphoresis, orthopnea, PND, claudication or edema. Respiratory: denies cough, dyspnea, DOE, pleurisy, hoarseness, laryngitis, wheezing.  Gastrointestinal: Denies dysphagia, odynophagia, heartburn, reflux, water brash, abdominal pain or cramps, nausea, vomiting, bloating, diarrhea, constipation, hematemesis, melena, hematochezia  or hemorrhoids. Genitourinary: Denies dysuria, frequency, urgency, nocturia, hesitancy, discharge, hematuria or flank pain. Musculoskeletal: Denies arthralgias, myalgias, stiffness, jt. swelling, pain, limping or strain/sprain.  Skin: Denies pruritus, rash, hives, warts, acne, eczema or change in skin lesion(s). Neuro: No weakness, tremor, incoordination, spasms, paresthesia or pain. Psychiatric: Denies confusion, memory loss or sensory loss. Endo: Denies change in weight, skin or hair change.  Heme/Lymph: No excessive bleeding, bruising  or enlarged lymph nodes.  Physical Exam  BP 182/100 -->> 175/92   P 68   T 97.5 F    R 18   Ht 5' 1.75"    Wt 168 lb BMI 31.01   Appears over  nourished, well groomed   and in no distress.  Eyes: PERRLA, EOMs, conjunctiva no swelling or erythema. Sinuses: No frontal/maxillary tenderness ENT/Mouth: EAC's clear, TM's nl w/o erythema, bulging. Nares clear w/o erythema, swelling, exudates. Oropharynx clear without erythema or exudates. Oral hygiene is good. Tongue normal, non obstructing. Hearing intact.  Neck: Supple. Thyroid nl. Car 2+/2+ without bruits, nodes or JVD. Chest: Respirations nl with BS clear & equal w/o rales, rhonchi, wheezing or stridor.  Cor: Heart sounds normal w/ regular rate and rhythm without sig. murmurs, gallops, clicks or rubs. Peripheral pulses normal and equal  without edema.  Abdomen: Soft & bowel sounds normal. Non-tender w/o guarding, rebound, hernias, masses or organomegaly.  Lymphatics: Unremarkable.  Musculoskeletal: Full ROM all peripheral extremities, joint stability, 5/5 strength and normal gait.  Skin: Warm, dry without exposed rashes, lesions or ecchymosis apparent.  Neuro: Cranial nerves intact, reflexes equal bilaterally. Sensory-motor testing grossly intact. Tendon reflexes grossly intact.  Pysch: Alert & oriented x 3.  Insight and judgement nl & appropriate. No ideations.  Assessment and Plan:  1. Essential hypertension  - advised more frequent BP monitoring of  BP's & call if remains elevated  - Continue medication, monitor blood pressure at home.  - Continue DASH diet. Reminder to go to the ER if any CP,  SOB, nausea, dizziness, severe HA, changes vision/speech.  - CBC with Differential/Platelet - BASIC METABOLIC PANEL WITH GFR - Magnesium - TSH  2. Hyperlipidemia, mixed  - Continue diet/meds, exercise,& lifestyle modifications.  - Continue monitor periodic cholesterol/liver & renal functions  - Hepatic function panel - Lipid panel - TSH  3. Prediabetes  - Continue diet, exercise, lifestyle modifications.  - Monitor appropriate labs.  - Hemoglobin A1c - Insulin, random  4. Vitamin D  deficiency  - Continue supplementation.  - VITAMIN D 25 Hydroxy   5. Hypothyroidism  - TSH  6. Medication management  - CBC with Differential/Platelet - BASIC METABOLIC PANEL WITH GFR - Hepatic function panel - Magnesium - Lipid panel - TSH - Hemoglobin A1c - Insulin, random - VITAMIN D 25 Hydroxy       Rx: Xanax 0.5 mg #30 )rf  For upcoming plane trip.       Discussed  regular exercise, BP monitoring, weight control to achieve/maintain BMI less than 25 and discussed med and SE's. Recommended labs to assess and monitor clinical status with further disposition pending results of labs. Over 30 minutes of exam, counseling, chart review was performed.

## 2016-12-07 LAB — MAGNESIUM: Magnesium: 2.1 mg/dL (ref 1.5–2.5)

## 2016-12-07 LAB — HEMOGLOBIN A1C
Hgb A1c MFr Bld: 6.2 % — ABNORMAL HIGH (ref <5.7)
Mean Plasma Glucose: 131 mg/dL

## 2016-12-07 LAB — VITAMIN D 25 HYDROXY (VIT D DEFICIENCY, FRACTURES): Vit D, 25-Hydroxy: 38 ng/mL (ref 30–100)

## 2016-12-07 LAB — INSULIN, RANDOM: Insulin: 25.6 u[IU]/mL — ABNORMAL HIGH (ref 2.0–19.6)

## 2017-01-15 ENCOUNTER — Other Ambulatory Visit: Payer: Self-pay | Admitting: Internal Medicine

## 2017-01-15 DIAGNOSIS — E785 Hyperlipidemia, unspecified: Secondary | ICD-10-CM

## 2017-02-05 ENCOUNTER — Telehealth: Payer: Self-pay | Admitting: *Deleted

## 2017-02-05 NOTE — Telephone Encounter (Signed)
Left message for pt to call.

## 2017-02-05 NOTE — Telephone Encounter (Signed)
Pt informed

## 2017-02-05 NOTE — Telephone Encounter (Signed)
Please call and review definition of menopause is no longer having a cycle for 1 full year, this may be her last cycle, have her call if any further bleeding.

## 2017-02-05 NOTE — Telephone Encounter (Signed)
Pt called stating her last cycle in on 02/03/16 ( per calender) states a cycle started on 02/02/17 was medium flow, now lighter. Pt had FSH checked in 2017 it was at 23, asked if this normal? Any recommendations? Please advise

## 2017-03-13 DIAGNOSIS — B353 Tinea pedis: Secondary | ICD-10-CM | POA: Diagnosis not present

## 2017-03-13 DIAGNOSIS — D2372 Other benign neoplasm of skin of left lower limb, including hip: Secondary | ICD-10-CM | POA: Diagnosis not present

## 2017-03-13 DIAGNOSIS — L821 Other seborrheic keratosis: Secondary | ICD-10-CM | POA: Diagnosis not present

## 2017-03-13 DIAGNOSIS — D2261 Melanocytic nevi of right upper limb, including shoulder: Secondary | ICD-10-CM | POA: Diagnosis not present

## 2017-03-19 ENCOUNTER — Other Ambulatory Visit: Payer: Self-pay | Admitting: Women's Health

## 2017-03-19 DIAGNOSIS — Z1231 Encounter for screening mammogram for malignant neoplasm of breast: Secondary | ICD-10-CM

## 2017-03-22 ENCOUNTER — Encounter: Payer: BLUE CROSS/BLUE SHIELD | Admitting: Women's Health

## 2017-03-26 ENCOUNTER — Encounter: Payer: Self-pay | Admitting: Women's Health

## 2017-03-26 ENCOUNTER — Ambulatory Visit (INDEPENDENT_AMBULATORY_CARE_PROVIDER_SITE_OTHER): Payer: BLUE CROSS/BLUE SHIELD | Admitting: Women's Health

## 2017-03-26 VITALS — BP 128/80 | Ht 61.0 in | Wt 169.0 lb

## 2017-03-26 DIAGNOSIS — Z01419 Encounter for gynecological examination (general) (routine) without abnormal findings: Secondary | ICD-10-CM | POA: Diagnosis not present

## 2017-03-26 NOTE — Patient Instructions (Signed)
Colonoscopy  [facility]  Dr Gessner  [phone]  Health Maintenance for Postmenopausal Women Menopause is a normal process in which your reproductive ability comes to an end. This process happens gradually over a span of months to years, usually between the ages of 48 and 55. Menopause is complete when you have missed 12 consecutive menstrual periods. It is important to talk with your health care provider about some of the most common conditions that affect postmenopausal women, such as heart disease, cancer, and bone loss (osteoporosis). Adopting a healthy lifestyle and getting preventive care can help to promote your health and wellness. Those actions can also lower your chances of developing some of these common conditions. What should I know about menopause? During menopause, you may experience a number of symptoms, such as:  Moderate-to-severe hot flashes.  Night sweats.  Decrease in sex drive.  Mood swings.  Headaches.  Tiredness.  Irritability.  Memory problems.  Insomnia.  Choosing to treat or not to treat menopausal changes is an individual decision that you make with your health care provider. What should I know about hormone replacement therapy and supplements? Hormone therapy products are effective for treating symptoms that are associated with menopause, such as hot flashes and night sweats. Hormone replacement carries certain risks, especially as you become older. If you are thinking about using estrogen or estrogen with progestin treatments, discuss the benefits and risks with your health care provider. What should I know about heart disease and stroke? Heart disease, heart attack, and stroke become more likely as you age. This may be due, in part, to the hormonal changes that your body experiences during menopause. These can affect how your body processes dietary fats, triglycerides, and cholesterol. Heart attack and stroke are both medical emergencies. There are many  things that you can do to help prevent heart disease and stroke:  Have your blood pressure checked at least every 1-2 years. High blood pressure causes heart disease and increases the risk of stroke.  If you are 55-79 years old, ask your health care provider if you should take aspirin to prevent a heart attack or a stroke.  Do not use any tobacco products, including cigarettes, chewing tobacco, or electronic cigarettes. If you need help quitting, ask your health care provider.  It is important to eat a healthy diet and maintain a healthy weight. ? Be sure to include plenty of vegetables, fruits, low-fat dairy products, and lean protein. ? Avoid eating foods that are high in solid fats, added sugars, or salt (sodium).  Get regular exercise. This is one of the most important things that you can do for your health. ? Try to exercise for at least 150 minutes each week. The type of exercise that you do should increase your heart rate and make you sweat. This is known as moderate-intensity exercise. ? Try to do strengthening exercises at least twice each week. Do these in addition to the moderate-intensity exercise.  Know your numbers.Ask your health care provider to check your cholesterol and your blood glucose. Continue to have your blood tested as directed by your health care provider.  What should I know about cancer screening? There are several types of cancer. Take the following steps to reduce your risk and to catch any cancer development as early as possible. Breast Cancer  Practice breast self-awareness. ? This means understanding how your breasts normally appear and feel. ? It also means doing regular breast self-exams. Let your health care provider know about any   changes, no matter how small.  If you are 40 or older, have a clinician do a breast exam (clinical breast exam or CBE) every year. Depending on your age, family history, and medical history, it may be recommended that you  also have a yearly breast X-ray (mammogram).  If you have a family history of breast cancer, talk with your health care provider about genetic screening.  If you are at high risk for breast cancer, talk with your health care provider about having an MRI and a mammogram every year.  Breast cancer (BRCA) gene test is recommended for women who have family members with BRCA-related cancers. Results of the assessment will determine the need for genetic counseling and BRCA1 and for BRCA2 testing. BRCA-related cancers include these types: ? Breast. This occurs in males or females. ? Ovarian. ? Tubal. This may also be called fallopian tube cancer. ? Cancer of the abdominal or pelvic lining (peritoneal cancer). ? Prostate. ? Pancreatic.  Cervical, Uterine, and Ovarian Cancer Your health care provider may recommend that you be screened regularly for cancer of the pelvic organs. These include your ovaries, uterus, and vagina. This screening involves a pelvic exam, which includes checking for microscopic changes to the surface of your cervix (Pap test).  For women ages 21-65, health care providers may recommend a pelvic exam and a Pap test every three years. For women ages 30-65, they may recommend the Pap test and pelvic exam, combined with testing for human papilloma virus (HPV), every five years. Some types of HPV increase your risk of cervical cancer. Testing for HPV may also be done on women of any age who have unclear Pap test results.  Other health care providers may not recommend any screening for nonpregnant women who are considered low risk for pelvic cancer and have no symptoms. Ask your health care provider if a screening pelvic exam is right for you.  If you have had past treatment for cervical cancer or a condition that could lead to cancer, you need Pap tests and screening for cancer for at least 20 years after your treatment. If Pap tests have been discontinued for you, your risk factors  (such as having a new sexual partner) need to be reassessed to determine if you should start having screenings again. Some women have medical problems that increase the chance of getting cervical cancer. In these cases, your health care provider may recommend that you have screening and Pap tests more often.  If you have a family history of uterine cancer or ovarian cancer, talk with your health care provider about genetic screening.  If you have vaginal bleeding after reaching menopause, tell your health care provider.  There are currently no reliable tests available to screen for ovarian cancer.  Lung Cancer Lung cancer screening is recommended for adults 55-80 years old who are at high risk for lung cancer because of a history of smoking. A yearly low-dose CT scan of the lungs is recommended if you:  Currently smoke.  Have a history of at least 30 pack-years of smoking and you currently smoke or have quit within the past 15 years. A pack-year is smoking an average of one pack of cigarettes per day for one year.  Yearly screening should:  Continue until it has been 15 years since you quit.  Stop if you develop a health problem that would prevent you from having lung cancer treatment.  Colorectal Cancer  This type of cancer can be detected and can often   be prevented.  Routine colorectal cancer screening usually begins at age 50 and continues through age 75.  If you have risk factors for colon cancer, your health care provider may recommend that you be screened at an earlier age.  If you have a family history of colorectal cancer, talk with your health care provider about genetic screening.  Your health care provider may also recommend using home test kits to check for hidden blood in your stool.  A small camera at the end of a tube can be used to examine your colon directly (sigmoidoscopy or colonoscopy). This is done to check for the earliest forms of colorectal cancer.  Direct  examination of the colon should be repeated every 5-10 years until age 75. However, if early forms of precancerous polyps or small growths are found or if you have a family history or genetic risk for colorectal cancer, you may need to be screened more often.  Skin Cancer  Check your skin from head to toe regularly.  Monitor any moles. Be sure to tell your health care provider: ? About any new moles or changes in moles, especially if there is a change in a mole's shape or color. ? If you have a mole that is larger than the size of a pencil eraser.  If any of your family members has a history of skin cancer, especially at a young age, talk with your health care provider about genetic screening.  Always use sunscreen. Apply sunscreen liberally and repeatedly throughout the day.  Whenever you are outside, protect yourself by wearing long sleeves, pants, a wide-brimmed hat, and sunglasses.  What should I know about osteoporosis? Osteoporosis is a condition in which bone destruction happens more quickly than new bone creation. After menopause, you may be at an increased risk for osteoporosis. To help prevent osteoporosis or the bone fractures that can happen because of osteoporosis, the following is recommended:  If you are 19-50 years old, get at least 1,000 mg of calcium and at least 600 mg of vitamin D per day.  If you are older than age 50 but younger than age 70, get at least 1,200 mg of calcium and at least 600 mg of vitamin D per day.  If you are older than age 70, get at least 1,200 mg of calcium and at least 800 mg of vitamin D per day.  Smoking and excessive alcohol intake increase the risk of osteoporosis. Eat foods that are rich in calcium and vitamin D, and do weight-bearing exercises several times each week as directed by your health care provider. What should I know about how menopause affects my mental health? Depression may occur at any age, but it is more common as you become  older. Common symptoms of depression include:  Low or sad mood.  Changes in sleep patterns.  Changes in appetite or eating patterns.  Feeling an overall lack of motivation or enjoyment of activities that you previously enjoyed.  Frequent crying spells.  Talk with your health care provider if you think that you are experiencing depression. What should I know about immunizations? It is important that you get and maintain your immunizations. These include:  Tetanus, diphtheria, and pertussis (Tdap) booster vaccine.  Influenza every year before the flu season begins.  Pneumonia vaccine.  Shingles vaccine.  Your health care provider may also recommend other immunizations. This information is not intended to replace advice given to you by your health care provider. Make sure you discuss any questions   questions you have with your health care provider. Document Released: 06/23/2005 Document Revised: 11/19/2015 Document Reviewed: 02/02/2015 Elsevier Interactive Patient Education  2018 [employer].

## 2017-03-26 NOTE — Progress Notes (Signed)
Allison Contreras 09/03/1965 [id number]    History:    Presents for annual exam.  One cycle in the past year. 2017 FSH 23. Having hot flushes most days not debilitating. Normal Pap and mammogram history. Has not had a screening colonoscopy. Primary care manages hypertension, hypercholesterolemia and hypothyroidism.   Past medical history, past surgical history, family history and social history were all reviewed and documented in the EPIC chart. Homemaker. Allison Contreras 19 [facility] doing well. Son expecting first child in April and hopes to help care for the baby. Parents deceased, father died from heart disease, mother diabetes, died from lymphoma.  ROS:  A ROS was performed and pertinent positives and negatives are included.  Exam:  Vitals:   03/26/17 0828  BP: 128/80  Weight: 169 lb (76.7 kg)  Height: 5\' 1"  (1.549 m)   Body mass index is 31.93 kg/m.   General appearance:  Normal Thyroid:  Symmetrical, normal in size, without palpable masses or nodularity. Respiratory  Auscultation:  Clear without wheezing or rhonchi Cardiovascular  Auscultation:  Regular rate, without rubs, murmurs or gallops  Edema/varicosities:  Not grossly evident Abdominal  Soft,nontender, without masses, guarding or rebound.  Liver/spleen:  No organomegaly noted  Hernia:  None appreciated  Skin  Inspection:  Grossly normal   Breasts: Examined lying and sitting.     Right: Without masses, retractions, discharge or axillary adenopathy.     Left: Without masses, retractions, discharge or axillary adenopathy. Gentitourinary   Inguinal/mons:  Normal without inguinal adenopathy  External genitalia:  Normal  BUS/Urethra/Skene's glands:  Normal  Vagina:  Normal  Cervix:  Normal  Uterus:  normal in size, shape and contour.  Midline and mobile  Adnexa/parametria:     Rt: Without masses or tenderness.   Lt: Without masses or tenderness.  Anus and perineum: Normal  Digital rectal exam: Normal sphincter tone without  palpated masses or tenderness  Assessment/Plan:  51 y.o. MWF G2 P2 for annual exam with no complaints.  Perimenopausal/one cycle in the past year Hypertension/hypercholesterolemia/hyperthyroidism-primary care manages labs and meds.  Plan: Reviewed importance of screening colonoscopy, [name] information given instructed to schedule. SBE's, continue annual screening mammogram, calcium rich diet, vitamin D 2000 daily encouraged. Reviewed importance of increasing exercise and decreasing calories/carbs for weight loss. Pap normal 2016, new screening guidelines reviewed.    Allison Contreras [facility]WHNP, 8:56 AM 03/26/2017

## 2017-03-29 ENCOUNTER — Encounter: Payer: Self-pay | Admitting: Physician Assistant

## 2017-03-29 ENCOUNTER — Ambulatory Visit: Payer: Self-pay | Admitting: Physician Assistant

## 2017-03-29 ENCOUNTER — Ambulatory Visit (INDEPENDENT_AMBULATORY_CARE_PROVIDER_SITE_OTHER): Payer: BLUE CROSS/BLUE SHIELD | Admitting: Physician Assistant

## 2017-03-29 VITALS — BP 126/80 | HR 62 | Temp 97.7°F | Resp 18 | Ht 61.0 in | Wt 164.4 lb

## 2017-03-29 DIAGNOSIS — I10 Essential (primary) hypertension: Secondary | ICD-10-CM

## 2017-03-29 DIAGNOSIS — R7303 Prediabetes: Secondary | ICD-10-CM | POA: Diagnosis not present

## 2017-03-29 DIAGNOSIS — Z79899 Other long term (current) drug therapy: Secondary | ICD-10-CM

## 2017-03-29 DIAGNOSIS — E782 Mixed hyperlipidemia: Secondary | ICD-10-CM

## 2017-03-29 DIAGNOSIS — E039 Hypothyroidism, unspecified: Secondary | ICD-10-CM

## 2017-03-29 NOTE — Patient Instructions (Addendum)
STOP SWEET TEA FOR THE BABY  Take iron daily, take 500mg  vitamin C with it and can take probiotic with it if you have constipation     Bad carbs also include fruit juice, alcohol, and sweet tea. These are empty calories that do not signal to your brain that you are full.   Please remember the good carbs are still carbs which convert into sugar. So please measure them out no more than 1/2-1 cup of rice, oatmeal, pasta, and beans  Veggies are however free foods! Pile them on.   Not all fruit is created equal. Please see the list below, the fruit at the bottom is higher in sugars than the fruit at the top. Please avoid all dried fruits.

## 2017-03-29 NOTE — Progress Notes (Signed)
Assessment and Plan:  Essential hypertension - continue medications, DASH diet, exercise and monitor at home. Call if greater than 130/80.  -     CBC with Differential/Platelet -     BASIC METABOLIC PANEL WITH GFR -     Hepatic function panel  Hyperlipidemia, unspecified hyperlipidemia type -continue medications, check lipids, decrease fatty foods, increase activity.  - on fenofibrate daily, still needs to stop sweet tea, recheck 3 months -     fenofibrate micronized (LOFIBRA) 134 MG capsule; Take 1 capsule (134 mg total) by mouth daily before breakfast. -     Lipid panel  Hypothyroidism, unspecified type Hypothyroidism-check TSH level, continue medications the same, reminded to take on an empty stomach 30-5660mins before food.  -     TSH  Morbid obesity (BMI 30.94) - long discussion about weight loss, diet, and exercise  Vitamin D deficiency -     VITAMIN D 25 Hydroxy (Vit-D Deficiency, Fractures)  Anemia Get on iron daily, recheck at CPE  Medication management -     Magnesium   Continue diet and meds as discussed. Further disposition pending results of labs. Future Appointments  Date Time Provider Department Center  04/17/2017  8:30 AM [facility]  07/03/2017  9:00 AM Lucky CowboyMcKeown, William, MD [facility] None    HPI 51 y.o. female  presents for 6 month follow up  has a past medical history of Hypertension and Hypothyroidism.  Her blood pressure has been controlled at home, today their BP is BP: 126/80  She does not workout. She denies chest pain, shortness of breath, dizziness.  She is on cholesterol medication, fenofibrate takes every other day, she also drinks sweet tea and denies myalgias. Her cholesterol is not at goal. The cholesterol last visit was:   Lab Results  Component Value Date   CHOL 177 12/06/2016   HDL 25 (L) 12/06/2016   LDLCALC NOT CALC 12/06/2016   TRIG 423 (H) 12/06/2016   CHOLHDL 7.1 (H) 12/06/2016    She has been working on diet  and exercise for prediabetes, and denies paresthesia of the feet, polydipsia, polyuria and visual disturbances. Last A1C in the office was:  Lab Results  Component Value Date   HGBA1C 6.2 (H) 12/06/2016   Patient is on Vitamin D supplement.   Lab Results  Component Value Date   VD25OH 38 12/06/2016     BMI is Body mass index is 31.06 kg/m., she is working on diet and exercise. Wt Readings from Last 3 Encounters:  03/29/17 164 lb 6.4 oz (74.6 kg)  03/26/17 169 lb (76.7 kg)  12/06/16 168 lb 3.2 oz (76.3 kg)   She is on thyroid medication. Her medication was not changed last visit.   Lab Results  Component Value Date   TSH 1.10 12/06/2016  .   Current Medications:  Current Outpatient Medications on File Prior to Visit  Medication Sig Dispense Refill  . bisoprolol-hydrochlorothiazide (ZIAC) 10-6.25 MG tablet TAKE ONE TABLET BY MOUTH ONCE DAILY 90 tablet 1  . fenofibrate micronized (LOFIBRA) 134 MG capsule TAKE ONE CAPSULE BY MOUTH EVERY DAY BEFORE BREAKFAST 90 capsule 1  . levothyroxine (SYNTHROID, LEVOTHROID) 112 MCG tablet TAKE 1 TABLET EVERY DAY BEFORE BREAKFAST 90 tablet 1   No current facility-administered medications on file prior to visit.    Medical History:  Past Medical History:  Diagnosis Date  . Hypertension   . Hypothyroidism    Allergies:  Allergies  Allergen Reactions  . Codeine   .  Ppd [Tuberculin Purified Protein Derivative]     Positive reaction 2001     Review of Systems:  Review of Systems  Constitutional: Negative.   HENT: Negative.   Eyes: Negative.   Respiratory: Negative.   Cardiovascular: Negative.   Gastrointestinal: Negative.   Genitourinary: Negative.   Musculoskeletal: Negative.   Skin: Negative.   Neurological: Negative.   Endo/Heme/Allergies: Negative.   Psychiatric/Behavioral: Negative.     Family history- Review and unchanged Social history- Review and unchanged Physical Exam: BP 126/80   Pulse 62   Temp 97.7 F (36.5 C)    Resp 18   Ht 5\' 1"  (1.549 m)   Wt 164 lb 6.4 oz (74.6 kg)   SpO2 97%   BMI 31.06 kg/m  Wt Readings from Last 3 Encounters:  03/29/17 164 lb 6.4 oz (74.6 kg)  03/26/17 169 lb (76.7 kg)  12/06/16 168 lb 3.2 oz (76.3 kg)   General Appearance: Well nourished, in no apparent distress. Eyes: PERRLA, EOMs, conjunctiva no swelling or erythema Sinuses: No Frontal/maxillary tenderness ENT/Mouth: Ext aud canals clear, TMs without erythema, bulging. No erythema, swelling, or exudate on post pharynx.  Tonsils not swollen or erythematous. Hearing normal.  Neck: Supple, thyroid normal.  Respiratory: Respiratory effort normal, BS equal bilaterally without rales, rhonchi, wheezing or stridor.  Cardio: RRR with no MRGs. Brisk peripheral pulses without edema.  Abdomen: Soft, + BS,  Non tender, no guarding, rebound, hernias, masses. Lymphatics: Non tender without lymphadenopathy.  Musculoskeletal: Full ROM, 5/5 strength, Normal gait Skin: Warm, dry without rashes, lesions, ecchymosis.  Neuro: Cranial nerves intact. Normal muscle tone, no cerebellar symptoms. Psych: Awake and oriented X 3, normal affect, Insight and Judgment appropriate.    Quentin MullingAmanda Collier, PA-C 9:47 AM [facility]

## 2017-03-30 LAB — CBC WITH DIFFERENTIAL/PLATELET
Basophils Absolute: 51 cells/uL (ref 0–200)
Basophils Relative: 0.6 %
Eosinophils Absolute: 162 cells/uL (ref 15–500)
Eosinophils Relative: 1.9 %
HCT: 40.1 % (ref 35.0–45.0)
Hemoglobin: 13.9 g/dL (ref 11.7–15.5)
Lymphs Abs: 2474 cells/uL (ref 850–3900)
MCH: 29.1 pg (ref 27.0–33.0)
MCHC: 34.7 g/dL (ref 32.0–36.0)
MCV: 84.1 fL (ref 80.0–100.0)
MPV: 10 fL (ref 7.5–12.5)
Monocytes Relative: 7.4 %
Neutro Abs: 5185 cells/uL (ref 1500–7800)
Neutrophils Relative %: 61 %
Platelets: 259 10*3/uL (ref 140–400)
RBC: 4.77 10*6/uL (ref 3.80–5.10)
RDW: 13 % (ref 11.0–15.0)
Total Lymphocyte: 29.1 %
WBC mixed population: 629 cells/uL (ref 200–950)
WBC: 8.5 10*3/uL (ref 3.8–10.8)

## 2017-03-30 LAB — HEPATIC FUNCTION PANEL
AG Ratio: 1.5 (calc) (ref 1.0–2.5)
ALT: 26 U/L (ref 6–29)
AST: 17 U/L (ref 10–35)
Albumin: 4.6 g/dL (ref 3.6–5.1)
Alkaline phosphatase (APISO): 57 U/L (ref 33–130)
Bilirubin, Direct: 0.1 mg/dL (ref 0.0–0.2)
Globulin: 3.1 g/dL (calc) (ref 1.9–3.7)
Indirect Bilirubin: 0.3 mg/dL (calc) (ref 0.2–1.2)
Total Bilirubin: 0.4 mg/dL (ref 0.2–1.2)
Total Protein: 7.7 g/dL (ref 6.1–8.1)

## 2017-03-30 LAB — TSH: TSH: 1.48 mIU/L

## 2017-03-30 LAB — BASIC METABOLIC PANEL WITH GFR
BUN: 12 mg/dL (ref 7–25)
CO2: 28 mmol/L (ref 20–32)
Calcium: 9.7 mg/dL (ref 8.6–10.4)
Chloride: 102 mmol/L (ref 98–110)
Creat: 0.92 mg/dL (ref 0.50–1.05)
GFR, Est African American: 84 mL/min/{1.73_m2} (ref >=60)
GFR, Est Non African American: 72 mL/min/{1.73_m2} (ref >=60)
Glucose, Bld: 136 mg/dL — ABNORMAL HIGH (ref 65–99)
Potassium: 4.8 mmol/L (ref 3.5–5.3)
Sodium: 140 mmol/L (ref 135–146)

## 2017-03-30 LAB — LIPID PANEL
Cholesterol: 189 mg/dL (ref <200)
HDL: 23 mg/dL — ABNORMAL LOW (ref >50)
Non-HDL Cholesterol (Calc): 166 mg/dL (calc) — ABNORMAL HIGH (ref <130)
Total CHOL/HDL Ratio: 8.2 (calc) — ABNORMAL HIGH (ref <5.0)
Triglycerides: 645 mg/dL — ABNORMAL HIGH (ref <150)

## 2017-03-30 LAB — HEMOGLOBIN A1C
Hgb A1c MFr Bld: 6.3 % of total Hgb — ABNORMAL HIGH (ref <5.7)
Mean Plasma Glucose: 134 (calc)
eAG (mmol/L): 7.4 (calc)

## 2017-03-30 LAB — MAGNESIUM: Magnesium: 2.2 mg/dL (ref 1.5–2.5)

## 2017-04-17 ENCOUNTER — Ambulatory Visit
Admission: RE | Admit: 2017-04-17 | Discharge: 2017-04-17 | Disposition: A | Discharge status: Home / Self Care | Payer: BLUE CROSS/BLUE SHIELD | Source: Ambulatory Visit | Attending: Women's Health | Admitting: Women's Health

## 2017-04-17 ENCOUNTER — Encounter: Payer: Self-pay | Admitting: Women's Health

## 2017-04-17 DIAGNOSIS — Z1231 Encounter for screening mammogram for malignant neoplasm of breast: Secondary | ICD-10-CM | POA: Diagnosis not present

## 2017-04-23 ENCOUNTER — Other Ambulatory Visit: Payer: Self-pay | Admitting: Internal Medicine

## 2017-05-10 ENCOUNTER — Ambulatory Visit: Payer: Self-pay | Admitting: Adult Health

## 2017-05-11 ENCOUNTER — Other Ambulatory Visit: Payer: Self-pay | Admitting: Adult Health

## 2017-05-11 DIAGNOSIS — J069 Acute upper respiratory infection, unspecified: Secondary | ICD-10-CM

## 2017-05-11 MED ORDER — PREDNISONE 20 MG PO TABS: ORAL_TABLET | ORAL | 0 refills | Status: DC

## 2017-05-11 MED ORDER — AZITHROMYCIN 250 MG PO TABS: ORAL_TABLET | ORAL | 1 refills | Status: DC

## 2017-06-05 ENCOUNTER — Encounter: Payer: Self-pay | Admitting: Physician Assistant

## 2017-07-03 ENCOUNTER — Encounter: Payer: Self-pay | Admitting: Internal Medicine

## 2017-07-03 ENCOUNTER — Ambulatory Visit: Payer: BLUE CROSS/BLUE SHIELD | Admitting: Internal Medicine

## 2017-07-03 ENCOUNTER — Other Ambulatory Visit: Payer: Self-pay | Admitting: *Deleted

## 2017-07-03 VITALS — BP 154/90 | HR 72 | Temp 97.5°F | Resp 16 | Ht 62.0 in | Wt 171.0 lb

## 2017-07-03 DIAGNOSIS — E559 Vitamin D deficiency, unspecified: Secondary | ICD-10-CM

## 2017-07-03 DIAGNOSIS — E039 Hypothyroidism, unspecified: Secondary | ICD-10-CM

## 2017-07-03 DIAGNOSIS — Z0000 Encounter for general adult medical examination without abnormal findings: Secondary | ICD-10-CM | POA: Diagnosis not present

## 2017-07-03 DIAGNOSIS — Z0001 Encounter for general adult medical examination with abnormal findings: Secondary | ICD-10-CM

## 2017-07-03 DIAGNOSIS — Z136 Encounter for screening for cardiovascular disorders: Secondary | ICD-10-CM

## 2017-07-03 DIAGNOSIS — R7303 Prediabetes: Secondary | ICD-10-CM

## 2017-07-03 DIAGNOSIS — R5383 Other fatigue: Secondary | ICD-10-CM

## 2017-07-03 DIAGNOSIS — I10 Essential (primary) hypertension: Secondary | ICD-10-CM | POA: Diagnosis not present

## 2017-07-03 DIAGNOSIS — Z79899 Other long term (current) drug therapy: Secondary | ICD-10-CM

## 2017-07-03 DIAGNOSIS — E785 Hyperlipidemia, unspecified: Secondary | ICD-10-CM

## 2017-07-03 DIAGNOSIS — Z8249 Family history of ischemic heart disease and other diseases of the circulatory system: Secondary | ICD-10-CM

## 2017-07-03 DIAGNOSIS — E782 Mixed hyperlipidemia: Secondary | ICD-10-CM

## 2017-07-03 MED ORDER — FENOFIBRATE MICRONIZED 134 MG PO CAPS: ORAL_CAPSULE | ORAL | 3 refills | Status: DC

## 2017-07-03 MED ORDER — ATENOLOL 100 MG PO TABS: 100.0000 mg | ORAL_TABLET | Freq: Every day | ORAL | 3 refills | Status: DC

## 2017-07-03 MED ORDER — FENOFIBRATE MICRONIZED 134 MG PO CAPS: ORAL_CAPSULE | ORAL | 1 refills | Status: DC

## 2017-07-03 MED ORDER — LEVOTHYROXINE SODIUM 112 MCG PO TABS: ORAL_TABLET | ORAL | 3 refills | Status: DC

## 2017-07-03 MED ORDER — LEVOTHYROXINE SODIUM 112 MCG PO TABS: ORAL_TABLET | ORAL | 1 refills | Status: DC

## 2017-07-03 NOTE — Patient Instructions (Signed)
Very important to take  (1) Enteric coated Low dose Adult Aspirin 81 mg daily  (2) Vit D 5,000 unit gel cap daily   +&+&+&+&+&+&+&+&+&+&+&+&+&+&+&+&+&+&+  ___________________________________________  Preventive Care for Adults  A healthy lifestyle and preventive care can promote health and wellness. Preventive health guidelines for women include the following key practices.  A routine yearly physical is a good way to check with your health care provider about your health and preventive screening. It is a chance to share any concerns and updates on your health and to receive a thorough exam.  Visit your dentist for a routine exam and preventive care every 6 months. Brush your teeth twice a day and floss once a day. Good oral hygiene prevents tooth decay and gum disease.  The frequency of eye exams is based on your age, health, family medical history, use of contact lenses, and other factors. Follow your health care provider's recommendations for frequency of eye exams.  Eat a healthy diet. Foods like vegetables, fruits, whole grains, low-fat dairy products, and lean protein foods contain the nutrients you need without too many calories. Decrease your intake of foods high in solid fats, added sugars, and salt. Eat the right amount of calories for you.Get information about a proper diet from your health care provider, if necessary.  Regular physical exercise is one of the most important things you can do for your health. Most adults should get at least 150 minutes of moderate-intensity exercise (any activity that increases your heart rate and causes you to sweat) each week. In addition, most adults need muscle-strengthening exercises on 2 or more days a week.  Maintain a healthy weight. The body mass index (BMI) is a screening tool to identify possible weight problems. It provides an estimate of body fat based on height and weight. Your health care provider can find your BMI and can help you  achieve or maintain a healthy weight.For adults 20 years and older:  A BMI below 18.5 is considered underweight.  A BMI of 18.5 to 24.9 is normal.  A BMI of 25 to 29.9 is considered overweight.  A BMI of 30 and above is considered obese.  Maintain normal blood lipids and cholesterol levels by exercising and minimizing your intake of saturated fat. Eat a balanced diet with plenty of fruit and vegetables. Blood tests for lipids and cholesterol should begin at age 91 and be repeated every 5 years. If your lipid or cholesterol levels are high, you are over 50, or you are at high risk for heart disease, you may need your cholesterol levels checked more frequently.Ongoing high lipid and cholesterol levels should be treated with medicines if diet and exercise are not working.  If you smoke, find out from your health care provider how to quit. If you do not use tobacco, do not start.  Lung cancer screening is recommended for adults aged 48-80 years who are at high risk for developing lung cancer because of a history of smoking. A yearly low-dose CT scan of the lungs is recommended for people who have at least a 30-pack-year history of smoking and are a current smoker or have quit within the past 15 years. A pack year of smoking is smoking an average of 1 pack of cigarettes a day for 1 year (for example: 1 pack a day for 30 years or 2 packs a day for 15 years). Yearly screening should continue until the smoker has stopped smoking for at least 15 years. Yearly screening should  be stopped for people who develop a health problem that would prevent them from having lung cancer treatment.  High blood pressure causes heart disease and increases the risk of stroke. Your blood pressure should be checked at least every 1 to 2 years. Ongoing high blood pressure should be treated with medicines if weight loss and exercise do not work.  If you are 21-60 years old, ask your health care provider if you should take  aspirin to prevent strokes.  Diabetes screening involves taking a blood sample to check your fasting blood sugar level. This should be done once every 3 years, after age 76, if you are within normal weight and without risk factors for diabetes. Testing should be considered at a younger age or be carried out more frequently if you are overweight and have at least 1 risk factor for diabetes.  Breast cancer screening is essential preventive care for women. You should practice "breast self-awareness." This means understanding the normal appearance and feel of your breasts and may include breast self-examination. Any changes detected, no matter how small, should be reported to a health care provider. Women in their 22s and 30s should have a clinical breast exam (CBE) by a health care provider as part of a regular health exam every 1 to 3 years. After age 13, women should have a CBE every year. Starting at age 20, women should consider having a mammogram (breast X-ray test) every year. Women who have a family history of breast cancer should talk to their health care provider about genetic screening. Women at a high risk of breast cancer should talk to their health care providers about having an MRI and a mammogram every year.  Breast cancer gene (BRCA)-related cancer risk assessment is recommended for women who have family members with BRCA-related cancers. BRCA-related cancers include breast, ovarian, tubal, and peritoneal cancers. Having family members with these cancers may be associated with an increased risk for harmful changes (mutations) in the breast cancer genes BRCA1 and BRCA2. Results of the assessment will determine the need for genetic counseling and BRCA1 and BRCA2 testing.  Routine pelvic exams to screen for cancer are no longer recommended for nonpregnant women who are considered low risk for cancer of the pelvic organs (ovaries, uterus, and vagina) and who do not have symptoms. Ask your health  care provider if a screening pelvic exam is right for you.  If you have had past treatment for cervical cancer or a condition that could lead to cancer, you need Pap tests and screening for cancer for at least 20 years after your treatment. If Pap tests have been discontinued, your risk factors (such as having a new sexual partner) need to be reassessed to determine if screening should be resumed. Some women have medical problems that increase the chance of getting cervical cancer. In these cases, your health care provider may recommend more frequent screening and Pap tests.  Colorectal cancer can be detected and often prevented. Most routine colorectal cancer screening begins at the age of 42 years and continues through age 67 years. However, your health care provider may recommend screening at an earlier age if you have risk factors for colon cancer. On a yearly basis, your health care provider may provide home test kits to check for hidden blood in the stool. Use of a small camera at the end of a tube, to directly examine the colon (sigmoidoscopy or colonoscopy), can detect the earliest forms of colorectal cancer. Talk to your health care  provider about this at age 57, when routine screening begins. Direct exam of the colon should be repeated every 5-10 years through age 26 years, unless early forms of pre-cancerous polyps or small growths are found.  Hepatitis C blood testing is recommended for all people born from [birth year] through 1965 and any individual with known risks for hepatitis C.  Pra  Osteoporosis is a disease in which the bones lose minerals and strength with aging. This can result in serious bone fractures or breaks. The risk of osteoporosis can be identified using a bone density scan. Women ages 54 years and over and women at risk for fractures or osteoporosis should discuss screening with their health care providers. Ask your health care provider whether you should take a calcium supplement  or vitamin D to reduce the rate of osteoporosis.  Menopause can be associated with physical symptoms and risks. Hormone replacement therapy is available to decrease symptoms and risks. You should talk to your health care provider about whether hormone replacement therapy is right for you.  Use sunscreen. Apply sunscreen liberally and repeatedly throughout the day. You should seek shade when your shadow is shorter than you. Protect yourself by wearing long sleeves, pants, a wide-brimmed hat, and sunglasses year round, whenever you are outdoors.  Once a month, do a whole body skin exam, using a mirror to look at the skin on your back. Tell your health care provider of new moles, moles that have irregular borders, moles that are larger than a pencil eraser, or moles that have changed in shape or color.  Stay current with required vaccines (immunizations).  Influenza vaccine. All adults should be immunized every year.  Tetanus, diphtheria, and acellular pertussis (Td, Tdap) vaccine. Pregnant women should receive 1 dose of Tdap vaccine during each pregnancy. The dose should be obtained regardless of the length of time since the last dose. Immunization is preferred during the 27th-36th week of gestation. An adult who has not previously received Tdap or who does not know her vaccine status should receive 1 dose of Tdap. This initial dose should be followed by tetanus and diphtheria toxoids (Td) booster doses every 10 years. Adults with an unknown or incomplete history of completing a 3-dose immunization series with Td-containing vaccines should begin or complete a primary immunization series including a Tdap dose. Adults should receive a Td booster every 10 years.  Varicella vaccine. An adult without evidence of immunity to varicella should receive 2 doses or a second dose if she has previously received 1 dose. Pregnant females who do not have evidence of immunity should receive the first dose after  pregnancy. This first dose should be obtained before leaving the health care facility. The second dose should be obtained 4-8 weeks after the first dose.  Human papillomavirus (HPV) vaccine. Females aged 13-26 years who have not received the vaccine previously should obtain the 3-dose series. The vaccine is not recommended for use in pregnant females. However, pregnancy testing is not needed before receiving a dose. If a female is found to be pregnant after receiving a dose, no treatment is needed. In that case, the remaining doses should be delayed until after the pregnancy. Immunization is recommended for any person with an immunocompromised condition through the age of 29 years if she did not get any or all doses earlier. During the 3-dose series, the second dose should be obtained 4-8 weeks after the first dose. The third dose should be obtained 24 weeks after the first dose and  16 weeks after the second dose.  Zoster vaccine. One dose is recommended for adults aged 68 years or older unless certain conditions are present.  Measles, mumps, and rubella (MMR) vaccine. Adults born before 45 generally are considered immune to measles and mumps. Adults born in [birth year] or later should have 1 or more doses of MMR vaccine unless there is a contraindication to the vaccine or there is laboratory evidence of immunity to each of the three diseases. A routine second dose of MMR vaccine should be obtained at least 28 days after the first dose for students attending postsecondary schools, health care workers, or international travelers. People who received inactivated measles vaccine or an unknown type of measles vaccine during 1963-1967 should receive 2 doses of MMR vaccine. People who received inactivated mumps vaccine or an unknown type of mumps vaccine before 1979 and are at high risk for mumps infection should consider immunization with 2 doses of MMR vaccine. For females of childbearing age, rubella immunity should  be determined. If there is no evidence of immunity, females who are not pregnant should be vaccinated. If there is no evidence of immunity, females who are pregnant should delay immunization until after pregnancy. Unvaccinated health care workers born before [birth year] who lack laboratory evidence of measles, mumps, or rubella immunity or laboratory confirmation of disease should consider measles and mumps immunization with 2 doses of MMR vaccine or rubella immunization with 1 dose of MMR vaccine.  Pneumococcal 13-valent conjugate (PCV13) vaccine. When indicated, a person who is uncertain of her immunization history and has no record of immunization should receive the PCV13 vaccine. An adult aged 59 years or older who has certain medical conditions and has not been previously immunized should receive 1 dose of PCV13 vaccine. This PCV13 should be followed with a dose of pneumococcal polysaccharide (PPSV23) vaccine. The PPSV23 vaccine dose should be obtained at least 8 weeks after the dose of PCV13 vaccine. An adult aged 40 years or older who has certain medical conditions and previously received 1 or more doses of PPSV23 vaccine should receive 1 dose of PCV13. The PCV13 vaccine dose should be obtained 1 or more years after the last PPSV23 vaccine dose.    Pneumococcal polysaccharide (PPSV23) vaccine. When PCV13 is also indicated, PCV13 should be obtained first. All adults aged 68 years and older should be immunized. An adult younger than age 54 years who has certain medical conditions should be immunized. Any person who resides in a nursing home or long-term care facility should be immunized. An adult smoker should be immunized. People with an immunocompromised condition and certain other conditions should receive both PCV13 and PPSV23 vaccines. People with human immunodeficiency virus (HIV) infection should be immunized as soon as possible after diagnosis. Immunization during chemotherapy or radiation therapy should  be avoided. Routine use of PPSV23 vaccine is not recommended for American Indians, [place] Natives, or people younger than 65 years unless there are medical conditions that require PPSV23 vaccine. When indicated, people who have unknown immunization and have no record of immunization should receive PPSV23 vaccine. One-time revaccination 5 years after the first dose of PPSV23 is recommended for people aged 19-64 years who have chronic kidney failure, nephrotic syndrome, asplenia, or immunocompromised conditions. People who received 1-2 doses of PPSV23 before age 41 years should receive another dose of PPSV23 vaccine at age 74 years or later if at least 5 years have passed since the previous dose. Doses of PPSV23 are not needed for people immunized with  PPSV23 at or after age 12 years.  Preventive Services / Frequency   Ages 3 to 47 years  Blood pressure check.  Lipid and cholesterol check.  Lung cancer screening. / Every year if you are aged 59-80 years and have a 30-pack-year history of smoking and currently smoke or have quit within the past 15 years. Yearly screening is stopped once you have quit smoking for at least 15 years or develop a health problem that would prevent you from having lung cancer treatment.  Clinical breast exam.** / Every year after age 27 years.  BRCA-related cancer risk assessment.** / For women who have family members with a BRCA-related cancer (breast, ovarian, tubal, or peritoneal cancers).  Mammogram.** / Every year beginning at age 76 years and continuing for as long as you are in good health. Consult with your health care provider.  Pap test.** / Every 3 years starting at age 75 years through age 28 or 56 years with a history of 3 consecutive normal Pap tests.  HPV screening.** / Every 3 years from ages 59 years through ages 47 to 74 years with a history of 3 consecutive normal Pap tests.  Fecal occult blood test (FOBT) of stool. / Every year beginning at age 78  years and continuing until age 89 years. You may not need to do this test if you get a colonoscopy every 10 years.  Flexible sigmoidoscopy or colonoscopy.** / Every 5 years for a flexible sigmoidoscopy or every 10 years for a colonoscopy beginning at age 20 years and continuing until age 51 years.  Hepatitis C blood test.** / For all people born from [birth year] through 1965 and any individual with known risks for hepatitis C.  Skin self-exam. / Monthly.  Influenza vaccine. / Every year.  Tetanus, diphtheria, and acellular pertussis (Tdap/Td) vaccine.** / Consult your health care provider. Pregnant women should receive 1 dose of Tdap vaccine during each pregnancy. 1 dose of Td every 10 years.  Varicella vaccine.** / Consult your health care provider. Pregnant females who do not have evidence of immunity should receive the first dose after pregnancy.  Zoster vaccine.** / 1 dose for adults aged 44 years or older.  Pneumococcal 13-valent conjugate (PCV13) vaccine.** / Consult your health care provider.  Pneumococcal polysaccharide (PPSV23) vaccine.** / 1 to 2 doses if you smoke cigarettes or if you have certain conditions.  Meningococcal vaccine.** / Consult your health care provider.  Hepatitis A vaccine.** / Consult your health care provider.  Hepatitis B vaccine.** / Consult your health care provider. Screening for abdominal aortic aneurysm (AAA)  by ultrasound is recommended for people over 50 who have history of high blood pressure or who are current or former smokers. ++++++++++++++++++ Recommend Adult Low Dose Aspirin or  coated  Aspirin 81 mg daily  To reduce risk of Colon Cancer 20 %,  Skin Cancer 26 % ,  Melanoma 46%  and  Pancreatic cancer 60% +++++++++++++++++++ Vitamin D goal  is between 70-100.  Please make sure that you are taking your Vitamin D as directed.  It is very important as a natural anti-inflammatory  helping hair, skin, and nails, as well as reducing stroke and  heart attack risk.  It helps your bones and helps with mood. It also decreases numerous cancer risks so please take it as directed.  Low Vit D is associated with a 200-300% higher risk for CANCER  and 200-300% higher risk for HEART   ATTACK  &  STROKE.   .....................................Marland Kitchen  It is also associated with higher death rate at younger ages,  autoimmune diseases like Rheumatoid arthritis, Lupus, Multiple Sclerosis.    Also many other serious conditions, like depression, Alzheimer's Dementia, infertility, muscle aches, fatigue, fibromyalgia - just to name a few. ++++++++++++++++++ Recommend the book "The END of DIETING" by Dr Excell Seltzer  & the book "The END of DIABETES " by Dr Excell Seltzer At [email or web address] - get book & Audio CD's    Being diabetic has a  300% increased risk for heart attack, stroke, cancer, and alzheimer- type vascular dementia. It is very important that you work harder with diet by avoiding all foods that are white. Avoid white rice (brown & wild rice is OK), white potatoes (sweetpotatoes in moderation is OK), White bread or wheat bread or anything made out of white flour like bagels, donuts, rolls, buns, biscuits, cakes, pastries, cookies, pizza crust, and pasta (made from white flour & egg whites) - vegetarian pasta or spinach or wheat pasta is OK. Multigrain breads like Arnold's or Pepper[employer], or multigrain sandwich thins or flatbreads.  Diet, exercise and weight loss can reverse and cure diabetes in the early stages.  Diet, exercise and weight loss is very important in the control and prevention of complications of diabetes which affects every system in your body, ie. Brain - dementia/stroke, eyes - glaucoma/blindness, heart - heart attack/heart failure, kidneys - dialysis, stomach - gastric paralysis, intestines - malabsorption, nerves - severe painful neuritis, circulation - gangrene & loss of a leg(s), and finally cancer and Alzheimers.    I recommend avoid  fried & greasy foods,  sweets/candy, white rice (brown or wild rice or Quinoa is OK), white potatoes (sweet potatoes are OK) - anything made from white flour - bagels, doughnuts, rolls, buns, biscuits,white and wheat breads, pizza crust and traditional pasta made of white flour & egg white(vegetarian pasta or spinach or wheat pasta is OK).  Multi-grain bread is OK - like multi-grain flat bread or sandwich thins. Avoid alcohol in excess. Exercise is also important.    Eat all the vegetables you want - avoid meat, especially red meat and dairy - especially cheese.  Cheese is the most concentrated form of trans-fats which is the worst thing to clog up our arteries. Veggie cheese is OK which can be found in the fresh produce section at [employer] or [employer] or [employer]  ++++++++++++++++++++++ DASH Eating Plan  DASH stands for "Dietary Approaches to Stop Hypertension."   The DASH eating plan is a healthy eating plan that has been shown to reduce high blood pressure (hypertension). Additional health benefits may include reducing the risk of type 2 diabetes mellitus, heart disease, and stroke. The DASH eating plan may also help with weight loss. WHAT DO I NEED TO KNOW ABOUT THE DASH EATING PLAN? For the DASH eating plan, you will follow these general guidelines:  Choose foods with a percent daily value for sodium of less than 5% (as listed on the food label).  Use salt-free seasonings or herbs instead of table salt or sea salt.  Check with your health care provider or pharmacist before using salt substitutes.  Eat lower-sodium products, often labeled as "lower sodium" or "no salt added."  Eat fresh foods.  Eat more vegetables, fruits, and low-fat dairy products.  Choose whole grains. Look for the word "whole" as the first word in the ingredient list.  Choose fish   Limit sweets, desserts, sugars, and sugary drinks.  Choose heart-healthy fats.  Eat  veggie cheese   Eat more  home-cooked food and less restaurant, buffet, and fast food.  Limit fried foods.  Cook foods using methods other than frying.  Limit canned vegetables. If you do use them, rinse them well to decrease the sodium.  When eating at a restaurant, ask that your food be prepared with less salt, or no salt if possible.                      WHAT FOODS CAN I EAT? Read Dr Fara Olden Fuhrman's books on The End of Dieting & The End of Diabetes  Grains Whole grain or whole wheat bread. Brown rice. Whole grain or whole wheat pasta. Quinoa, bulgur, and whole grain cereals. Low-sodium cereals. Corn or whole wheat flour tortillas. Whole grain cornbread. Whole grain crackers. Low-sodium crackers.  Vegetables Fresh or frozen vegetables (raw, steamed, roasted, or grilled). Low-sodium or reduced-sodium tomato and vegetable juices. Low-sodium or reduced-sodium tomato sauce and paste. Low-sodium or reduced-sodium canned vegetables.   Fruits All fresh, canned (in natural juice), or frozen fruits.  Protein Products  All fish and seafood.  Dried beans, peas, or lentils. Unsalted nuts and seeds. Unsalted canned beans.  Dairy Low-fat dairy products, such as skim or 1% milk, 2% or reduced-fat cheeses, low-fat ricotta or cottage cheese, or plain low-fat yogurt. Low-sodium or reduced-sodium cheeses.  Fats and Oils Tub margarines without trans fats. Light or reduced-fat mayonnaise and salad dressings (reduced sodium). Avocado. Safflower, olive, or canola oils. Natural peanut or almond butter.  Other Unsalted popcorn and pretzels. The items listed above may not be a complete list of recommended foods or beverages. Contact your dietitian for more options.  ++++++++++++++++++  WHAT FOODS ARE NOT RECOMMENDED? Grains/ White flour or wheat flour White bread. White pasta. White rice. Refined cornbread. Bagels and croissants. Crackers that contain trans fat.  Vegetables  Creamed or fried vegetables. Vegetables in a .  Regular canned vegetables. Regular canned tomato sauce and paste. Regular tomato and vegetable juices.  Fruits Dried fruits. Canned fruit in light or heavy syrup. Fruit juice.  Meat and Other Protein Products Meat in general - RED meat & White meat.  Fatty cuts of meat. Ribs, chicken wings, all processed meats as bacon, sausage, bologna, salami, fatback, hot dogs, bratwurst and packaged luncheon meats.  Dairy Whole or 2% milk, cream, half-and-half, and cream cheese. Whole-fat or sweetened yogurt. Full-fat cheeses or blue cheese. Non-dairy creamers and whipped toppings. Processed cheese, cheese spreads, or cheese curds.  Condiments Onion and garlic salt, seasoned salt, table salt, and sea salt. Canned and packaged gravies. Worcestershire sauce. Tartar sauce. Barbecue sauce. Teriyaki sauce. Soy sauce, including reduced sodium. Steak sauce. Fish sauce. Oyster sauce. Cocktail sauce. Horseradish. Ketchup and mustard. Meat flavorings and tenderizers. Bouillon cubes. Hot sauce. Tabasco sauce. Marinades. Taco seasonings. Relishes.  Fats and Oils Butter, stick margarine, lard, shortening and bacon fat. Coconut, palm kernel, or palm oils. Regular salad dressings.  Pickles and olives. Salted popcorn and pretzels.  The items listed above may not be a complete list of foods and beverages to avoid.

## 2017-07-03 NOTE — Progress Notes (Signed)
[facility] Allison Contreras, M.D.     Allison Contreras. Allison Contreras, P.A.-C Allison Gaudier, DNP [facility] [address] [place], [place]. [address] Telephone [phone] Telefax [phone] Annual Screening/Preventative Visit & Comprehensive Evaluation &  Examination     This very nice 52 y.o. MWF presents for a Screening/Preventative Visit & comprehensive evaluation and management of multiple medical co-morbidities.  Patient has been followed for HTN,  Prediabetes, HLD, Hypothyroidism  and Vitamin D Deficiency.      HTN predates circa 2000. Patient's BP has been controlled at home and patient denies any cardiac symptoms as chest pain, palpitations, shortness of breath, dizziness or ankle swelling. Today's BP is elevated at 154/90. Last year BP's were 182/100 and 175/92.      Patient's hyperlipidemia is not controlled with diet and medications. Patient denies myalgias or other medication SE's. Last lipids were at goal albeit LDL obscured by elevated Trig's. Patient relates that she decided to only take her fenofibrate every other day! Lab Results  Component Value Date   CHOL 179 07/03/2017   HDL 27 (L) 07/03/2017   LDLCALC NOT CALC 12/06/2016   TRIG 708 (H) 07/03/2017   CHOLHDL 6.6 (H) 07/03/2017      Patient has Morbid Obesity (BMI 31+) and prediabetes/Insulin Resisrtance (A1c 5.7% w/elev insulin "41" /2015)  and patient denies reactive hypoglycemic symptoms, visual blurring, diabetic polys, or paresthesias. Last A1c was not at goal: Lab Results  Component Value Date   HGBA1C 6.3 (H) 03/29/2017      Patient was dx'd Hypothyroid in 1997 and has been on replacement therapy since.     Finally, patient has history of Vitamin D Deficiency ("20"/2013 and "16"/2014)  and last Vitamin D was still very low : Lab Results  Component Value Date   VD25OH 38 12/06/2016   Medication Sig  . bisoprolol-hydrochlorothiazide (ZIAC) 10-6.25  MG tablet TAKE 1 TABLET BY MOUTH ONCE DAILY  . fenofibrate(LOFIBRA) 134 MG  TAKE ONE CAPSULE BY MOUTH EVERY DAY BEFORE BREAKFAST  . Levothyroxine  112 MCG tablet TAKE 1 TABLET EVERY DAY BEFORE BREAKFAST   Allergies  Allergen Reactions  . Codeine   . Ppd [Tuberculin Purified Protein Derivative]     Positive reaction 2001   Past Medical History:  Diagnosis Date  . Hypertension   . Hypothyroidism    Health Maintenance  Topic Date Due  . HIV Screening  01/20/1981  . COLONOSCOPY  01/21/2016  . PAP SMEAR  03/22/2016  . INFLUENZA VACCINE  12/13/2016  . MAMMOGRAM  04/17/2018  . TETANUS/TDAP  08/01/2022   Immunization History  Administered Date(s) Administered  . Td 06/29/2001, 07/31/2012  . Tdap 07/31/2012   Last Colon - Patient has never had colonoscopy, but is agreeable to a Cologard and will confirm coverage with her ins co.  Last MGM - 04/17/2017  Past Surgical History:  Procedure Laterality Date  . LYMPH NODE DISSECTION     NECK- BENIGN   Family History  Problem Relation Age of Onset  . Cancer Mother        NON HODGKINS LYMPHOMA  . Hypertension Mother   . Diabetes Mother   . Hyperlipidemia Mother   . Hypertension Father   . Heart disease Father    Social History   Tobacco Use  . Smoking status: Never Smoker  . Smokeless tobacco: Never Used  Substance Use Topics  . Alcohol use: Yes    Comment: occ.  . Drug use: No  ROS Constitutional: Denies fever, chills, weight loss/gain, headaches, insomnia,  night sweats, and change in appetite. Does c/o fatigue. Eyes: Denies redness, blurred vision, diplopia, discharge, itchy, watery eyes.  ENT: Denies discharge, congestion, post nasal drip, epistaxis, sore throat, earache, hearing loss, dental pain, Tinnitus, Vertigo, Sinus pain, snoring.  Cardio: Denies chest pain, palpitations, irregular heartbeat, syncope, dyspnea, diaphoresis, orthopnea, PND, claudication, edema Respiratory: denies cough, dyspnea, DOE, pleurisy,  hoarseness, laryngitis, wheezing.  Gastrointestinal: Denies dysphagia, heartburn, reflux, water brash, pain, cramps, nausea, vomiting, bloating, diarrhea, constipation, hematemesis, melena, hematochezia, jaundice, hemorrhoids Genitourinary: Denies dysuria, frequency, urgency, nocturia, hesitancy, discharge, hematuria, flank pain Breast: Breast lumps, nipple discharge, bleeding.  Musculoskeletal: Denies arthralgia, myalgia, stiffness, Jt. Swelling, pain, limp, and strain/sprain. Denies falls. Skin: Denies puritis, rash, hives, warts, acne, eczema, changing in skin lesion Neuro: No weakness, tremor, incoordination, spasms, paresthesia, pain Psychiatric: Denies confusion, memory loss, sensory loss. Denies Depression. Endocrine: Denies change in weight, skin, hair change, nocturia, and paresthesia, diabetic polys, visual blurring, hyper / hypo glycemic episodes.  Heme/Lymph: No excessive bleeding, bruising, enlarged lymph nodes.  Physical Exam  BP (!) 154/90   Pulse 72   Temp (!) 97.5 F (36.4 C)   Resp 16   Ht 5\' 2"  (1.575 m)   Wt 171 lb (77.6 kg)   BMI 31.28 kg/m   General Appearance: Over nourished, well groomed and in no apparent distress.  Eyes: PERRLA, EOMs, conjunctiva no swelling or erythema, normal fundi and vessels. Sinuses: No frontal/maxillary tenderness ENT/Mouth: EACs patent / TMs  nl. Nares clear without erythema, swelling, mucoid exudates. Oral hygiene is good. No erythema, swelling, or exudate. Tongue normal, non-obstructing. Tonsils not swollen or erythematous. Hearing normal.  Neck: Supple, thyroid not palpable. No bruits, nodes or JVD. Respiratory: Respiratory effort normal.  BS equal and clear bilateral without rales, rhonci, wheezing or stridor. Cardio: Heart sounds are normal with regular rate and rhythm and no murmurs, rubs or gallops. Peripheral pulses are normal and equal bilaterally without edema. No aortic or femoral bruits. Chest: symmetric with normal  excursions and percussion. Breasts: Symmetric, without lumps, nipple discharge, retractions, or fibrocystic changes.  Abdomen: Rotund, soft with bowel sounds active. Nontender, no guarding, rebound, hernias, masses, or organomegaly.  Lymphatics: Non tender without lymphadenopathy.  Genitourinary: Deferred to GYN Musculoskeletal: Full ROM all peripheral extremities, joint stability, 5/5 strength, and normal gait. Skin: Warm and dry without rashes, lesions, cyanosis, clubbing or  ecchymosis.  Neuro: Cranial nerves intact, reflexes equal bilaterally. Normal muscle tone, no cerebellar symptoms. Sensation intact.  Pysch: Alert and oriented X 3, normal affect, Insight and Judgment appropriate.   Assessment and Plan  1. Annual Preventative Screening Examination  2. Essential hypertension  - BASIC METABOLIC PANEL WITH GFR - TSH - D/c Ziac due to increased expense & replace with  - atenolol (TENORMIN) 100 MG tablet; Take 1 tablet (100 mg total) by mouth daily.  Dispense: 90 tablet; Refill: 3  3. Hyperlipidemia, mixed  - Lipid panel - TSH - fenofibrate micronized (LOFIBRA) 134 MG capsule; TAKE ONE CAP EVERY DAY  Dispense: 90 capsule; Refill: 3  4. Prediabetes  - Hemoglobin A1c  5. Vitamin D deficiency  - VITAMIN D 25 Hydroxy   6. Hypothyroidism,  - TSH - levothyroxine  112 MCG tablet; TAKE 1 TAB EVERY DAY  Dispense: 90 tablet; Refill: 3  7. Screening for ischemic heart disease  8. Family history of ischemic heart disease  9. Fatigue  - TSH  10. Medication management  - BASIC METABOLIC  PANEL WITH GFR - Lipid panel - TSH - Hemoglobin A1c - VITAMIN D 25 Hydroxy         Patient was counseled in prudent diet to achieve/maintain BMI less than 25 for weight control, BP monitoring, regular exercise and medications. Discussed med's effects and SE's. Patient related that her Insurance coverage for Lab tests was very poor and requested only bare essential of labs.  Over 40 minutes  of exam, counseling, chart review and high complex critical decision making was performed.

## 2017-07-04 LAB — VITAMIN D 25 HYDROXY (VIT D DEFICIENCY, FRACTURES): Vit D, 25-Hydroxy: 22 ng/mL — ABNORMAL LOW (ref 30–100)

## 2017-07-04 LAB — BASIC METABOLIC PANEL WITH GFR
BUN: 13 mg/dL (ref 7–25)
CO2: 26 mmol/L (ref 20–32)
Calcium: 9.7 mg/dL (ref 8.6–10.4)
Chloride: 100 mmol/L (ref 98–110)
Creat: 0.71 mg/dL (ref 0.50–1.05)
GFR, Est African American: 114 mL/min/{1.73_m2} (ref >=60)
GFR, Est Non African American: 99 mL/min/{1.73_m2} (ref >=60)
Glucose, Bld: 143 mg/dL — ABNORMAL HIGH (ref 65–99)
Potassium: 4.1 mmol/L (ref 3.5–5.3)
Sodium: 137 mmol/L (ref 135–146)

## 2017-07-04 LAB — HEMOGLOBIN A1C
Hgb A1c MFr Bld: 6.5 % of total Hgb — ABNORMAL HIGH (ref <5.7)
Mean Plasma Glucose: 140 (calc)
eAG (mmol/L): 7.7 (calc)

## 2017-07-04 LAB — LIPID PANEL
Cholesterol: 179 mg/dL (ref <200)
HDL: 27 mg/dL — ABNORMAL LOW (ref >50)
Non-HDL Cholesterol (Calc): 152 mg/dL (calc) — ABNORMAL HIGH (ref <130)
Total CHOL/HDL Ratio: 6.6 (calc) — ABNORMAL HIGH (ref <5.0)
Triglycerides: 708 mg/dL — ABNORMAL HIGH (ref <150)

## 2017-07-04 LAB — TSH: TSH: 2.33 mIU/L

## 2017-07-13 ENCOUNTER — Other Ambulatory Visit: Payer: Self-pay | Admitting: Internal Medicine

## 2017-07-13 DIAGNOSIS — I10 Essential (primary) hypertension: Secondary | ICD-10-CM

## 2017-07-13 MED ORDER — LISINOPRIL-HYDROCHLOROTHIAZIDE 20-12.5 MG PO TABS: ORAL_TABLET | ORAL | 1 refills | Status: DC

## 2017-07-17 ENCOUNTER — Other Ambulatory Visit: Payer: Self-pay | Admitting: Internal Medicine

## 2017-07-17 ENCOUNTER — Telehealth: Payer: Self-pay | Admitting: *Deleted

## 2017-07-17 MED ORDER — OLMESARTAN MEDOXOMIL 20 MG PO TABS: 20.0000 mg | ORAL_TABLET | Freq: Every day | ORAL | 1 refills | Status: DC

## 2017-07-17 NOTE — Telephone Encounter (Signed)
Patient called and reported a dry cough and nausea since starting Lisinopril.  Per Dr Oneta RackMcKeown, the Lisinopril was discontinued and a new RX for Benicar 20 mg was sent to [facility].  Patient is aware.

## 2017-07-24 ENCOUNTER — Telehealth: Payer: Self-pay | Admitting: *Deleted

## 2017-07-24 NOTE — Telephone Encounter (Signed)
Patient called and asked when she should take her blood pressure medications. Per Dr Oneta RackMcKeown, take the Atenolol in the mornings and Benicar in the evenings. Patient is aware.

## 2017-08-20 NOTE — Progress Notes (Deleted)
Assessment and Plan:  There are no diagnoses linked to this encounter.    Further disposition pending results of labs. Discussed med's effects and SE's.   Over 15 minutes of exam, counseling, chart review, and critical decision making was performed.   Future Appointments  Date Time Provider Department Center  08/21/2017 10:30 AM Judd Gaudierorbett, Keian Odriscoll, NP [facility] None  10/24/2017  8:45 AM Judd Gaudierorbett, Reiner Loewen, NP [facility] None  01/28/2018  9:30 AM Lucky CowboyMcKeown, William, MD [facility] None  07/19/2018  9:00 AM Lucky CowboyMcKeown, William, MD [facility] None    ------------------------------------------------------------------------------------------------------------------   HPI There were no vitals taken for this visit.  52 y.o.female presents for evaluation after reportedly removed a tick from her abdomen yesterday    Past Medical History:  Diagnosis Date  . Hypertension   . Hypothyroidism      Allergies  Allergen Reactions  . Codeine   . Ppd [Tuberculin Purified Protein Derivative]     Positive reaction 2001  . Lisinopril     Dry cough    Current Outpatient Medications on File Prior to Visit  Medication Sig  . atenolol (TENORMIN) 100 MG tablet Take 1 tablet (100 mg total) by mouth daily.  . fenofibrate micronized (LOFIBRA) 134 MG capsule TAKE ONE CAPSULE BY MOUTH EVERY DAY BEFORE BREAKFAST  . levothyroxine (SYNTHROID, LEVOTHROID) 112 MCG tablet TAKE 1 TABLET EVERY DAY BEFORE BREAKFAST  . olmesartan (BENICAR) 20 MG tablet Take 1 tablet (20 mg total) by mouth daily.   No current facility-administered medications on file prior to visit.     ROS: all negative except above.   Physical Exam:  There were no vitals taken for this visit.  General Appearance: Well nourished, in no apparent distress. Eyes: PERRLA, EOMs, conjunctiva no swelling or erythema Sinuses: No Frontal/maxillary tenderness ENT/Mouth: Ext aud canals clear, TMs without erythema, bulging. No erythema, swelling, or exudate  on post pharynx.  Tonsils not swollen or erythematous. Hearing normal.  Neck: Supple, thyroid normal.  Respiratory: Respiratory effort normal, BS equal bilaterally without rales, rhonchi, wheezing or stridor.  Cardio: RRR with no MRGs. Brisk peripheral pulses without edema.  Abdomen: Soft, + BS.  Non tender, no guarding, rebound, hernias, masses. Lymphatics: Non tender without lymphadenopathy.  Musculoskeletal: Full ROM, 5/5 strength, normal gait.  Skin: Warm, dry without rashes, lesions, ecchymosis.  Neuro: Cranial nerves intact. Normal muscle tone, no cerebellar symptoms. Sensation intact.  Psych: Awake and oriented X 3, normal affect, Insight and Judgment appropriate.     Dan MakerAshley C Keniel Ralston, NP 10:28 AM [facility]

## 2017-08-21 ENCOUNTER — Ambulatory Visit: Payer: Self-pay | Admitting: Adult Health

## 2017-08-21 ENCOUNTER — Ambulatory Visit (INDEPENDENT_AMBULATORY_CARE_PROVIDER_SITE_OTHER): Payer: BLUE CROSS/BLUE SHIELD | Admitting: Adult Health

## 2017-08-21 ENCOUNTER — Encounter: Payer: Self-pay | Admitting: Adult Health

## 2017-08-21 VITALS — BP 142/80 | HR 55 | Temp 97.9°F | Ht 62.0 in | Wt 169.0 lb

## 2017-08-21 DIAGNOSIS — S30861A Insect bite (nonvenomous) of abdominal wall, initial encounter: Secondary | ICD-10-CM | POA: Diagnosis not present

## 2017-08-21 DIAGNOSIS — W57XXXA Bitten or stung by nonvenomous insect and other nonvenomous arthropods, initial encounter: Secondary | ICD-10-CM | POA: Diagnosis not present

## 2017-08-21 DIAGNOSIS — I10 Essential (primary) hypertension: Secondary | ICD-10-CM | POA: Diagnosis not present

## 2017-08-21 MED ORDER — DOXYCYCLINE HYCLATE 100 MG PO TABS: ORAL_TABLET | ORAL | 0 refills | Status: DC

## 2017-08-21 NOTE — Progress Notes (Signed)
Assessment and Plan:  Allison Contreras was seen today for tick removal.  Diagnoses and all orders for this visit:  Tick bite of abdomen, initial encounter Unknown tick/duration, will do prophylactic once time 200 mg dose Discussed methods to avoid tick bites in the future, get dogs treated for tick prophylaxis -     doxycycline (VIBRA-TABS) 100 MG tablet; Take 2 tab (200 mg) once as needed for prophylaxis after tick bite.  Essential hypertension Continue medication: olemesartan 20 mg daily, may increase to 40 mg daily at next visit -  Monitor blood pressure at home; call if consistently over 130/80 Continue DASH diet.   Reminder to go to the ER if any CP, SOB, nausea, dizziness, severe HA, changes vision/speech, left arm numbness and tingling and jaw pain.  Further disposition pending results of labs. Discussed med's effects and SE's.   Over 15 minutes of exam, counseling, chart review, and critical decision making was performed.   Future Appointments  Date Time Provider Department Center  10/24/2017  8:45 AM Judd Gaudierorbett, Mathhew Buysse, NP [facility] None  01/28/2018  9:30 AM Lucky CowboyMcKeown, William, MD [facility] None  07/19/2018  9:00 AM Lucky CowboyMcKeown, William, MD [facility] None    ------------------------------------------------------------------------------------------------------------------   HPI BP (!) 142/80   Pulse (!) 55   Temp 97.9 F (36.6 C)   Ht 5\' 2"  (1.575 m)   Wt 169 lb (76.7 kg)   SpO2 97%   BMI 30.91 kg/m   52 y.o.female presents for evaluation of a tick bite on abdomen that was discovered yesterday. Unsure of what kind of tick, or how long the tick may have been there. Tick was removed by pinchers, then husband used hot needle to dig out head. Presents with small injected area to left low abdoman <5 mm.  Discussed BP today; she reports newly on olmesartan 20 mg - BPs at home somewhat improved from previous, running 130-140s/80s. Discussed goal is under 130/80; she would like to defer  increasing medication further at this time, will discuss with her at her follow up visit in June.   Past Medical History:  Diagnosis Date  . Hypertension   . Hypothyroidism      Allergies  Allergen Reactions  . Codeine   . Ppd [Tuberculin Purified Protein Derivative]     Positive reaction 2001  . Lisinopril     Dry cough    Current Outpatient Medications on File Prior to Visit  Medication Sig  . atenolol (TENORMIN) 100 MG tablet Take 1 tablet (100 mg total) by mouth daily.  . fenofibrate micronized (LOFIBRA) 134 MG capsule TAKE ONE CAPSULE BY MOUTH EVERY DAY BEFORE BREAKFAST  . levothyroxine (SYNTHROID, LEVOTHROID) 112 MCG tablet TAKE 1 TABLET EVERY DAY BEFORE BREAKFAST  . olmesartan (BENICAR) 20 MG tablet Take 1 tablet (20 mg total) by mouth daily.   No current facility-administered medications on file prior to visit.     ROS: all negative except above.   Physical Exam:  BP (!) 142/80   Pulse (!) 55   Temp 97.9 F (36.6 C)   Ht 5\' 2"  (1.575 m)   Wt 169 lb (76.7 kg)   SpO2 97%   BMI 30.91 kg/m   General Appearance: Well nourished, in no apparent distress. Eyes: PERRLA, EOMs, conjunctiva no swelling or erythema ENT/Mouth:  No erythema, swelling, or exudate on post pharynx.  Tonsils not swollen or erythematous. Hearing normal.  Neck: Supple.  Respiratory: Respiratory effort normal, BS equal bilaterally without rales, rhonchi, wheezing or stridor.  Cardio: RRR  with no MRGs.  Lymphatics: Non tender without lymphadenopathy.  Musculoskeletal: normal gait.  Skin: Warm, dry without rashes, ecchymosis;  Small injected area to right lower abdomen <69mm area without distinct lesion or notable discharge.   Psych: Awake and oriented X 3, mildly anxious affect, Insight and Judgment appropriate.    Dan Maker, NP 12:01 PM [facility]

## 2017-08-21 NOTE — Patient Instructions (Signed)

## 2017-10-23 NOTE — Progress Notes (Signed)
FOLLOW UP  Assessment and Plan:   Hypertension Didn't tolerate olmesartan - will try hctz Monitor blood pressure at home; patient to call if consistently greater than 130/80 Continue DASH diet.   Reminder to go to the ER if any CP, SOB, nausea, dizziness, severe HA, changes vision/speech, left arm numbness and tingling and jaw pain.  Cholesterol Currently above goal; emphasized need for diet adherence, continue fenofibrate- take daily, consider adding omega 3 Continue low cholesterol diet and exercise.  Check lipid panel.   Prediabetes Continue diet and exercise. - patient declines medications Perform daily foot/skin check, notify office of any concerning changes.  Check A1C  Obesity with co morbidities Long discussion about weight loss, diet, and exercise Recommended diet heavy in fruits and veggies and low in animal meats, cheeses, and dairy products, appropriate calorie intake Discussed ideal weight for height  Will follow up in 3 months  Hypothyroidism continue medications the same pending lab results reminded to take on an empty stomach 30-3560mins before food.  check TSH level  Vitamin D Def Below goal at last visit; continue supplementation to maintain goal of 70-100 Defer Vit D level  Continue diet and meds as discussed. Further disposition pending results of labs. Discussed med's effects and SE's.   Over 30 minutes of exam, counseling, chart review, and critical decision making was performed.   Future Appointments  Date Time Provider Department Center  01/28/2018  9:30 AM Lucky CowboyMcKeown, William, MD [facility] None  07/19/2018  9:00 AM Lucky CowboyMcKeown, William, MD [facility] None    ----------------------------------------------------------------------------------------------------------------------  HPI 52 y.o. female  presents for 3 month follow up on hypertension, cholesterol, prediabetes, obesity, hypothyroidism and vitamin D deficiency.   BMI is Body mass index is 31.09  kg/m., she has been working on diet and exercise.  Wt Readings from Last 3 Encounters:  10/24/17 170 lb (77.1 kg)  08/21/17 169 lb (76.7 kg)  07/03/17 171 lb (77.6 kg)   Her blood pressure has not been controlled at home, today their BP is BP: (!) 156/94  She does workout. She denies chest pain, shortness of breath, dizziness.   She is on cholesterol medication (fenofibrate, currently taking every other day by personal choice) and denies myalgias. Her cholesterol is not at goal. The cholesterol last visit was:   Lab Results  Component Value Date   CHOL 179 07/03/2017   HDL 27 (L) 07/03/2017   LDLCALC  07/03/2017     Comment:     . LDL cholesterol not calculated. Triglyceride levels greater than 400 mg/dL invalidate calculated LDL results. . Reference range: <100 . Desirable range <100 mg/dL for primary prevention;   <70 mg/dL for patients with CHD or diabetic patients  with > or = 2 CHD risk factors. Marland Kitchen. LDL-C is now calculated using the Martin-Hopkins  calculation, which is a validated novel method providing  better accuracy than the Friedewald equation in the  estimation of LDL-C.  Horald PollenMartin SS et al. Lenox AhrJAMA. 9562;130(862013;310(19): 2061-2068  (http://education.[employer].com/faq/FAQ164)    TRIG 708 (H) 07/03/2017   CHOLHDL 6.6 (H) 07/03/2017    She has been working on diet and exercise for prediabetes, and denies foot ulcerations, increased appetite, nausea, paresthesia of the feet, polydipsia, polyuria, visual disturbances, vomiting and weight loss. Last A1C in the office was:  Lab Results  Component Value Date   HGBA1C 6.5 (H) 07/03/2017   She is on thyroid medication. Her medication was not changed last visit.   Lab Results  Component Value Date   TSH  2.33 07/03/2017  . Patient is not on Vitamin D supplement.   Lab Results  Component Value Date   VD25OH 22 (L) 07/03/2017        Current Medications:  Current Outpatient Medications on File Prior to Visit  Medication  Sig  . atenolol (TENORMIN) 100 MG tablet Take 1 tablet (100 mg total) by mouth daily.  . fenofibrate micronized (LOFIBRA) 134 MG capsule TAKE ONE CAPSULE BY MOUTH EVERY DAY BEFORE BREAKFAST  . levothyroxine (SYNTHROID, LEVOTHROID) 112 MCG tablet TAKE 1 TABLET EVERY DAY BEFORE BREAKFAST   No current facility-administered medications on file prior to visit.      Allergies:  Allergies  Allergen Reactions  . Codeine   . Ppd [Tuberculin Purified Protein Derivative]     Positive reaction 2001  . Lisinopril     Dry cough     Medical History:  Past Medical History:  Diagnosis Date  . Hypertension   . Hypothyroidism    Family history- Reviewed and unchanged Social history- Reviewed and unchanged   Review of Systems:  Review of Systems  Constitutional: Negative for malaise/fatigue and weight loss.  HENT: Negative for hearing loss and tinnitus.   Eyes: Negative for blurred vision and double vision.  Respiratory: Negative for cough, shortness of breath and wheezing.   Cardiovascular: Negative for chest pain, palpitations, orthopnea, claudication and leg swelling.  Gastrointestinal: Negative for abdominal pain, blood in stool, constipation, diarrhea, heartburn, melena, nausea and vomiting.  Genitourinary: Negative.   Musculoskeletal: Negative for joint pain and myalgias.  Skin: Negative for rash.  Neurological: Negative for dizziness, tingling, sensory change, weakness and headaches.  Endo/Heme/Allergies: Negative for polydipsia.  Psychiatric/Behavioral: Negative.   All other systems reviewed and are negative.     Physical Exam: BP (!) 156/94   Pulse 63   Temp 98.1 F (36.7 C)   Ht 5\' 2"  (1.575 m)   Wt 170 lb (77.1 kg)   SpO2 99%   BMI 31.09 kg/m  Wt Readings from Last 3 Encounters:  10/24/17 170 lb (77.1 kg)  08/21/17 169 lb (76.7 kg)  07/03/17 171 lb (77.6 kg)   General Appearance: Well nourished, in no apparent distress. Eyes: PERRLA, EOMs, conjunctiva no  swelling or erythema Sinuses: No Frontal/maxillary tenderness ENT/Mouth: Ext aud canals clear, TMs without erythema, bulging. No erythema, swelling, or exudate on post pharynx.  Tonsils not swollen or erythematous. Hearing normal.  Neck: Supple, thyroid normal.  Respiratory: Respiratory effort normal, BS equal bilaterally without rales, rhonchi, wheezing or stridor.  Cardio: RRR with no MRGs. Brisk peripheral pulses without edema.  Abdomen: Soft, + BS.  Non tender, no guarding, rebound, hernias, masses. Lymphatics: Non tender without lymphadenopathy.  Musculoskeletal: Full ROM, 5/5 strength, Normal gait Skin: Warm, dry without rashes, lesions, ecchymosis.  Neuro: Cranial nerves intact. No cerebellar symptoms.  Psych: Awake and oriented X 3, normal affect, Insight and Judgment appropriate.    Dan Maker, NP 9:00 AM [facility]

## 2017-10-24 ENCOUNTER — Ambulatory Visit (INDEPENDENT_AMBULATORY_CARE_PROVIDER_SITE_OTHER): Payer: BLUE CROSS/BLUE SHIELD | Admitting: Adult Health

## 2017-10-24 ENCOUNTER — Encounter: Payer: Self-pay | Admitting: Adult Health

## 2017-10-24 ENCOUNTER — Other Ambulatory Visit: Payer: Self-pay | Admitting: *Deleted

## 2017-10-24 VITALS — BP 156/94 | HR 63 | Temp 98.1°F | Ht 62.0 in | Wt 170.0 lb

## 2017-10-24 DIAGNOSIS — R7303 Prediabetes: Secondary | ICD-10-CM

## 2017-10-24 DIAGNOSIS — I10 Essential (primary) hypertension: Secondary | ICD-10-CM | POA: Diagnosis not present

## 2017-10-24 DIAGNOSIS — E782 Mixed hyperlipidemia: Secondary | ICD-10-CM | POA: Diagnosis not present

## 2017-10-24 DIAGNOSIS — E559 Vitamin D deficiency, unspecified: Secondary | ICD-10-CM | POA: Diagnosis not present

## 2017-10-24 DIAGNOSIS — E039 Hypothyroidism, unspecified: Secondary | ICD-10-CM

## 2017-10-24 DIAGNOSIS — E669 Obesity, unspecified: Secondary | ICD-10-CM

## 2017-10-24 DIAGNOSIS — Z79899 Other long term (current) drug therapy: Secondary | ICD-10-CM

## 2017-10-24 MED ORDER — HYDROCHLOROTHIAZIDE 25 MG PO TABS: 25.0000 mg | ORAL_TABLET | Freq: Every day | ORAL | 1 refills | Status: DC

## 2017-10-24 NOTE — Patient Instructions (Addendum)
Aim for 7+ servings of fruits and vegetables daily  80+ fluid ounces of water or unsweet tea for healthy kidneys  Limit alcohol  Limit animal fats in diet for cholesterol and heart health - choose grass fed whenever available  Aim for low stress - take time to unwind and care for your mental health  Aim for 150 min of moderate intensity exercise weekly for heart health, and weights twice weekly for bone health  Aim for 7-9 hours of sleep daily     When it comes to diets, agreement about the perfect plan isn't easy to find, even among the experts. Experts at the [facility] [employer] developed an idea known as the Healthy Eating Plate. Just imagine a plate divided into logical, healthy portions.  The emphasis is on diet quality:  Load up on vegetables and fruits - one-half of your plate: Aim for color and variety, and remember that potatoes don't count.  Go for whole grains - one-quarter of your plate: Whole wheat, barley, wheat berries, quinoa, oats, brown rice, and foods made with them. If you want pasta, go with whole wheat pasta.  Protein power - one-quarter of your plate: Fish, chicken, beans, and nuts are all healthy, versatile protein sources. Limit red meat.  The diet, however, does go beyond the plate, offering a few other suggestions.  Use healthy plant oils, such as olive, canola, soy, corn, sunflower and peanut. Check the labels, and avoid partially hydrogenated oil, which have unhealthy trans fats.  If you're thirsty, drink water. Coffee and tea are good in moderation, but skip sugary drinks and limit milk and dairy products to one or two daily servings.  The type of carbohydrate in the diet is more important than the amount. Some sources of carbohydrates, such as vegetables, fruits, whole grains, and beans-are healthier than others.  Finally, stay active.       Monitor your blood pressure at home, please keep a record and bring that in with you to  your next office visit.   Go to the ER if any CP, SOB, nausea, dizziness, severe HA, changes vision/speech  Due to a recent study, SPRINT, we have changed our goal for the systolic or top blood pressure number. Ideally we want your top number at 120.  In the [facility] Trial, 5000 people were randomized to a goal BP of 120 and 5000 people were randomized to a goal BP of less than 140. The patients with the goal BP at 120 had LESS DEMENTIA, LESS HEART ATTACKS, AND LESS STROKES, AS WELL AS OVERALL DECREASED MORTALITY OR DEATH RATE.   If you are willing, our goal BP is the top number of 120.  Your most recent BP: BP: (!) 156/94   Take your medications faithfully as instructed. Maintain a healthy weight. Get at least 150 minutes of aerobic exercise per week. Minimize salt intake. Minimize alcohol intake  DASH Eating Plan DASH stands for "Dietary Approaches to Stop Hypertension." The DASH eating plan is a healthy eating plan that has been shown to reduce high blood pressure (hypertension). Additional health benefits may include reducing the risk of type 2 diabetes mellitus, heart disease, and stroke. The DASH eating plan may also help with weight loss. WHAT DO I NEED TO KNOW ABOUT THE DASH EATING PLAN? For the DASH eating plan, you will follow these general guidelines:  Choose foods with a percent daily value for sodium of less than 5% (as listed on the food label).  Use  salt-free seasonings or herbs instead of table salt or sea salt.  Check with your health care provider or pharmacist before using salt substitutes.  Eat lower-sodium products, often labeled as "lower sodium" or "no salt added."  Eat fresh foods.  Eat more vegetables, fruits, and low-fat dairy products.  Choose whole grains. Look for the word "whole" as the first word in the ingredient list.  Choose fish and skinless chicken or [place] more often than red meat. Limit fish, poultry, and meat to 6 oz (170 g) each day.  Limit  sweets, desserts, sugars, and sugary drinks.  Choose heart-healthy fats.  Limit cheese to 1 oz (28 g) per day.  Eat more home-cooked food and less restaurant, buffet, and fast food.  Limit fried foods.  Cook foods using methods other than frying.  Limit canned vegetables. If you do use them, rinse them well to decrease the sodium.  When eating at a restaurant, ask that your food be prepared with less salt, or no salt if possible. WHAT FOODS CAN I EAT? Seek help from a dietitian for individual calorie needs. Grains Whole grain or whole wheat bread. Brown rice. Whole grain or whole wheat pasta. Quinoa, bulgur, and whole grain cereals. Low-sodium cereals. Corn or whole wheat flour tortillas. Whole grain cornbread. Whole grain crackers. Low-sodium crackers. Vegetables Fresh or frozen vegetables (raw, steamed, roasted, or grilled). Low-sodium or reduced-sodium tomato and vegetable juices. Low-sodium or reduced-sodium tomato sauce and paste. Low-sodium or reduced-sodium canned vegetables.  Fruits All fresh, canned (in natural juice), or frozen fruits. Meat and Other Protein Products Ground beef (85% or leaner), grass-fed beef, or beef trimmed of fat. Skinless chicken or [place]. Ground chicken or [place]. Pork trimmed of fat. All fish and seafood. Eggs. Dried beans, peas, or lentils. Unsalted nuts and seeds. Unsalted canned beans. Dairy Low-fat dairy products, such as skim or 1% milk, 2% or reduced-fat cheeses, low-fat ricotta or cottage cheese, or plain low-fat yogurt. Low-sodium or reduced-sodium cheeses. Fats and Oils Tub margarines without trans fats. Light or reduced-fat mayonnaise and salad dressings (reduced sodium). Avocado. Safflower, olive, or canola oils. Natural peanut or almond butter. Other Unsalted popcorn and pretzels. The items listed above may not be a complete list of recommended foods or beverages. Contact your dietitian for more options. WHAT FOODS ARE NOT  RECOMMENDED? Grains White bread. White pasta. White rice. Refined cornbread. Bagels and croissants. Crackers that contain trans fat. Vegetables Creamed or fried vegetables. Vegetables in a cheese sauce. Regular canned vegetables. Regular canned tomato sauce and paste. Regular tomato and vegetable juices. Fruits Dried fruits. Canned fruit in light or heavy syrup. Fruit juice. Meat and Other Protein Products Fatty cuts of meat. Ribs, chicken wings, bacon, sausage, bologna, salami, chitterlings, fatback, hot dogs, bratwurst, and packaged luncheon meats. Salted nuts and seeds. Canned beans with salt. Dairy Whole or 2% milk, cream, half-and-half, and cream cheese. Whole-fat or sweetened yogurt. Full-fat cheeses or blue cheese. Nondairy creamers and whipped toppings. Processed cheese, cheese spreads, or cheese curds. Condiments Onion and garlic salt, seasoned salt, table salt, and sea salt. Canned and packaged gravies. Worcestershire sauce. Tartar sauce. Barbecue sauce. Teriyaki sauce. Soy sauce, including reduced sodium. Steak sauce. Fish sauce. Oyster sauce. Cocktail sauce. Horseradish. Ketchup and mustard. Meat flavorings and tenderizers. Bouillon cubes. Hot sauce. Tabasco sauce. Marinades. Taco seasonings. Relishes. Fats and Oils Butter, stick margarine, lard, shortening, ghee, and bacon fat. Coconut, palm kernel, or palm oils. Regular salad dressings. Other Pickles and olives. Salted popcorn and pretzels.  The items listed above may not be a complete list of foods and beverages to avoid. Contact your dietitian for more information. WHERE CAN I FIND MORE INFORMATION? National Heart, Lung, and Blood Institute: [employer] Document Released: 04/20/2011 Document Revised: 09/15/2013 Document Reviewed: 03/05/2013 [facility] Patient Information 2015 [place], [place]. This information is not intended to replace advice given to you by your health care provider. Make  sure you discuss any questions you have with your health care provider.      Hydrochlorothiazide, HCTZ capsules or tablets What is this medicine? HYDROCHLOROTHIAZIDE (hye droe klor oh THYE a zide) is a diuretic. It increases the amount of urine passed, which causes the body to lose salt and water. This medicine is used to treat high blood pressure. It is also reduces the swelling and water retention caused by various medical conditions, such as heart, liver, or kidney disease. This medicine may be used for other purposes; ask your health care provider or pharmacist if you have questions. COMMON BRAND NAME(S): Esidrix, Ezide, HydroDIURIL, Microzide, Oretic, Zide What should I tell my health care provider before I take this medicine? They need to know if you have any of these conditions: -diabetes -gout -immune system problems, like lupus -kidney disease or kidney stones -liver disease -pancreatitis -small amount of urine or difficulty passing urine -an unusual or allergic reaction to hydrochlorothiazide, sulfa drugs, other medicines, foods, dyes, or preservatives -pregnant or trying to get pregnant -breast-feeding How should I use this medicine? Take this medicine by mouth with a glass of water. Follow the directions on the prescription label. Take your medicine at regular intervals. Remember that you will need to pass urine frequently after taking this medicine. Do not take your doses at a time of day that will cause you problems. Do not stop taking your medicine unless your doctor tells you to. Talk to your pediatrician regarding the use of this medicine in children. Special care may be needed. Overdosage: If you think you have taken too much of this medicine contact a poison control center or emergency room at once. NOTE: This medicine is only for you. Do not share this medicine with others. What if I miss a dose? If you miss a dose, take it as soon as you can. If it is almost time for  your next dose, take only that dose. Do not take double or extra doses. What may interact with this medicine? -cholestyramine -colestipol -digoxin -dofetilide -lithium -medicines for blood pressure -medicines for diabetes -medicines that relax muscles for surgery -other diuretics -steroid medicines like prednisone or cortisone This list may not describe all possible interactions. Give your health care provider a list of all the medicines, herbs, non-prescription drugs, or dietary supplements you use. Also tell them if you smoke, drink alcohol, or use illegal drugs. Some items may interact with your medicine. What should I watch for while using this medicine? Visit your doctor or health care professional for regular checks on your progress. Check your blood pressure as directed. Ask your doctor or health care professional what your blood pressure should be and when you should contact him or her. You may need to be on a special diet while taking this medicine. Ask your doctor. Check with your doctor or health care professional if you get an attack of severe diarrhea, nausea and vomiting, or if you sweat a lot. The loss of too much body fluid can make it dangerous for you to take this medicine. You may get drowsy or dizzy.  Do not drive, use machinery, or do anything that needs mental alertness until you know how this medicine affects you. Do not stand or sit up quickly, especially if you are an older patient. This reduces the risk of dizzy or fainting spells. Alcohol may interfere with the effect of this medicine. Avoid alcoholic drinks. This medicine may affect your blood sugar level. If you have diabetes, check with your doctor or health care professional before changing the dose of your diabetic medicine. This medicine can make you more sensitive to the sun. Keep out of the sun. If you cannot avoid being in the sun, wear protective clothing and use sunscreen. Do not use sun lamps or tanning  beds/booths. What side effects may I notice from receiving this medicine? Side effects that you should report to your doctor or health care professional as soon as possible: -allergic reactions such as skin rash or itching, hives, swelling of the lips, mouth, tongue, or throat -changes in vision -chest pain -eye pain -fast or irregular heartbeat -feeling faint or lightheaded, falls -gout attack -muscle pain or cramps -pain or difficulty when passing urine -pain, tingling, numbness in the hands or feet -redness, blistering, peeling or loosening of the skin, including inside the mouth -unusually weak or tired Side effects that usually do not require medical attention (report to your doctor or health care professional if they continue or are bothersome): -change in sex drive or performance -dry mouth -headache -stomach upset This list may not describe all possible side effects. Call your doctor for medical advice about side effects. You may report side effects to FDA at 1-800-FDA-1088. Where should I keep my medicine? Keep out of the reach of children. Store at room temperature between 15 and 30 degrees C (59 and 86 degrees F). Do not freeze. Protect from light and moisture. Keep container closed tightly. Throw away any unused medicine after the expiration date. NOTE: This sheet is a summary. It may not cover all possible information. If you have questions about this medicine, talk to your doctor, pharmacist, or health care provider.  2018 Elsevier/Gold Standard (2009-12-24 12:57:37)

## 2017-10-25 ENCOUNTER — Other Ambulatory Visit: Payer: Self-pay | Admitting: Adult Health

## 2017-10-25 DIAGNOSIS — E039 Hypothyroidism, unspecified: Secondary | ICD-10-CM

## 2017-10-25 LAB — COMPLETE METABOLIC PANEL WITH GFR
AG Ratio: 1.6 (calc) (ref 1.0–2.5)
ALT: 35 U/L — ABNORMAL HIGH (ref 6–29)
AST: 25 U/L (ref 10–35)
Albumin: 4.7 g/dL (ref 3.6–5.1)
Alkaline phosphatase (APISO): 68 U/L (ref 33–130)
BUN: 12 mg/dL (ref 7–25)
CO2: 27 mmol/L (ref 20–32)
Calcium: 9.8 mg/dL (ref 8.6–10.4)
Chloride: 102 mmol/L (ref 98–110)
Creat: 0.89 mg/dL (ref 0.50–1.05)
GFR, Est African American: 87 mL/min/{1.73_m2} (ref >=60)
GFR, Est Non African American: 75 mL/min/{1.73_m2} (ref >=60)
Globulin: 3 g/dL (calc) (ref 1.9–3.7)
Glucose, Bld: 144 mg/dL — ABNORMAL HIGH (ref 65–99)
Potassium: 4.8 mmol/L (ref 3.5–5.3)
Sodium: 137 mmol/L (ref 135–146)
Total Bilirubin: 0.5 mg/dL (ref 0.2–1.2)
Total Protein: 7.7 g/dL (ref 6.1–8.1)

## 2017-10-25 LAB — LIPID PANEL
Cholesterol: 193 mg/dL (ref <200)
HDL: 29 mg/dL — ABNORMAL LOW (ref >50)
Non-HDL Cholesterol (Calc): 164 mg/dL (calc) — ABNORMAL HIGH (ref <130)
Total CHOL/HDL Ratio: 6.7 (calc) — ABNORMAL HIGH (ref <5.0)
Triglycerides: 527 mg/dL — ABNORMAL HIGH (ref <150)

## 2017-10-25 LAB — HEMOGLOBIN A1C
Hgb A1c MFr Bld: 6.6 % of total Hgb — ABNORMAL HIGH (ref <5.7)
Mean Plasma Glucose: 143 (calc)
eAG (mmol/L): 7.9 (calc)

## 2017-10-25 LAB — CBC WITH DIFFERENTIAL/PLATELET
Basophils Absolute: 54 cells/uL (ref 0–200)
Basophils Relative: 0.6 %
Eosinophils Absolute: 180 cells/uL (ref 15–500)
Eosinophils Relative: 2 %
HCT: 39.8 % (ref 35.0–45.0)
Hemoglobin: 13.8 g/dL (ref 11.7–15.5)
Lymphs Abs: 2574 cells/uL (ref 850–3900)
MCH: 29.3 pg (ref 27.0–33.0)
MCHC: 34.7 g/dL (ref 32.0–36.0)
MCV: 84.5 fL (ref 80.0–100.0)
MPV: 10 fL (ref 7.5–12.5)
Monocytes Relative: 7.3 %
Neutro Abs: 5535 cells/uL (ref 1500–7800)
Neutrophils Relative %: 61.5 %
Platelets: 285 10*3/uL (ref 140–400)
RBC: 4.71 10*6/uL (ref 3.80–5.10)
RDW: 13.7 % (ref 11.0–15.0)
Total Lymphocyte: 28.6 %
WBC mixed population: 657 cells/uL (ref 200–950)
WBC: 9 10*3/uL (ref 3.8–10.8)

## 2017-10-25 LAB — TSH: TSH: 6.42 mIU/L — ABNORMAL HIGH

## 2017-11-22 ENCOUNTER — Other Ambulatory Visit: Payer: BLUE CROSS/BLUE SHIELD

## 2017-11-22 DIAGNOSIS — E039 Hypothyroidism, unspecified: Secondary | ICD-10-CM | POA: Diagnosis not present

## 2017-11-22 LAB — TSH: TSH: 4.35 mIU/L

## 2018-01-18 ENCOUNTER — Ambulatory Visit (INDEPENDENT_AMBULATORY_CARE_PROVIDER_SITE_OTHER): Payer: BLUE CROSS/BLUE SHIELD | Admitting: Physician Assistant

## 2018-01-18 ENCOUNTER — Encounter: Payer: Self-pay | Admitting: Physician Assistant

## 2018-01-18 VITALS — BP 140/86 | HR 77 | Temp 98.3°F | Resp 14 | Ht 62.0 in | Wt 166.0 lb

## 2018-01-18 DIAGNOSIS — J029 Acute pharyngitis, unspecified: Secondary | ICD-10-CM

## 2018-01-18 LAB — POCT RAPID STREP A (OFFICE): Rapid Strep A Screen: NEGATIVE

## 2018-01-18 MED ORDER — PREDNISONE 20 MG PO TABS: ORAL_TABLET | ORAL | 0 refills | Status: DC

## 2018-01-18 NOTE — Patient Instructions (Signed)
Make sure you are on an allergy pill, see below for more details. Please take the prednisone as directed below, this is NOT an antibiotic so you do NOT have to finish it. You can take it for a few days and stop it if you are doing better.   Please take the prednisone to help decrease inflammation and therefore decrease symptoms. Take it it with food to avoid GI upset. It can cause increased energy but on the other hand it can make it hard to sleep at night so please take it AT NIGHT WITH DINNER, it takes 8-12 hours to start working so it will NOT affect your sleeping if you take it at night with your food!!  If you are diabetic it will increase your sugars so decrease carbs and monitor your sugars closely.      Pharyngitis Pharyngitis is redness, pain, and swelling (inflammation) of the throat (pharynx). It is a very common cause of sore throat. Pharyngitis can be caused by a bacteria, but it is usually caused by a virus. Most cases of pharyngitis get better on their own without treatment. What are the causes? This condition may be caused by:  Infection by viruses (viral). Viral pharyngitis spreads from person to person (is contagious) through coughing, sneezing, and sharing of personal items or utensils such as cups, forks, spoons, and toothbrushes.  Infection by bacteria (bacterial). Bacterial pharyngitis may be spread by touching the nose or face after coming in contact with the bacteria, or through more intimate contact, such as kissing.  Allergies. Allergies can cause buildup of mucus in the throat (post-nasal drip), leading to inflammation and irritation. Allergies can also cause blocked nasal passages, forcing breathing through the mouth, which dries and irritates the throat.  What increases the risk? You are more likely to develop this condition if:  You are 40-5 years old.  You are exposed to crowded environments such as daycare, school, or dormitory living.  You live in a cold  climate.  You have a weakened disease-fighting (immune) system.  What are the signs or symptoms? Symptoms of this condition vary by the cause (viral, bacterial, or allergies) and can include:  Sore throat.  Fatigue.  Low-grade fever.  Headache.  Joint pain and muscle aches.  Skin rashes.  Swollen glands in the throat (lymph nodes).  Plaque-like film on the throat or tonsils. This is often a symptom of bacterial pharyngitis.  Vomiting.  Stuffy nose (nasal congestion).  Cough.  Red, itchy eyes (conjunctivitis).  Loss of appetite.  How is this diagnosed? This condition is often diagnosed based on your medical history and a physical exam. Your health care provider will ask you questions about your illness and your symptoms. A swab of your throat may be done to check for bacteria (rapid strep test). Other lab tests may also be done, depending on the suspected cause, but these are rare. How is this treated? This condition usually gets better in 3-4 days without medicine. Bacterial pharyngitis may be treated with antibiotic medicines. Follow these instructions at home:  Take over-the-counter and prescription medicines only as told by your health care provider. ? If you were prescribed an antibiotic medicine, take it as told by your health care provider. Do not stop taking the antibiotic even if you start to feel better. ? Do not give children aspirin because of the association with Reye syndrome.  Drink enough water and fluids to keep your urine clear or pale yellow.  Get a lot of  rest.  Gargle with a salt-water mixture 3-4 times a day or as needed. To make a salt-water mixture, completely dissolve -1 tsp of salt in 1 cup of warm water.  If your health care provider approves, you may use throat lozenges or sprays to soothe your throat. Contact a health care provider if:  You have large, tender lumps in your neck.  You have a rash.  You cough up green, yellow-brown,  or bloody spit. Get help right away if:  Your neck becomes stiff.  You drool or are unable to swallow liquids.  You cannot drink or take medicines without vomiting.  You have severe pain that does not go away, even after you take medicine.  You have trouble breathing, and it is not caused by a stuffy nose.  You have new pain and swelling in your joints such as the knees, ankles, wrists, or elbows. Summary  Pharyngitis is redness, pain, and swelling (inflammation) of the throat (pharynx).  While pharyngitis can be caused by a bacteria, the most common causes are viral.  Most cases of pharyngitis get better on their own without treatment.  Bacterial pharyngitis is treated with antibiotic medicines. This information is not intended to replace advice given to you by your health care provider. Make sure you discuss any questions you have with your health care provider. Document Released: 05/01/2005 Document Revised: 06/06/2016 Document Reviewed: 06/06/2016 Elsevier Interactive Patient Education  [employer].

## 2018-01-18 NOTE — Progress Notes (Signed)
   Subjective:    Patient ID: Allison Contreras, female    DOB: Jan 26, 1966, 52 y.o.   MRN: [id number]  HPI  52 y.o. WF with history of HTN, hypothyroid presents with blisters on her throat. Woke up with blisters and white spots on her throat Thursday morning, the white spots have "fallen off" but the red bumps are still there, minor discomfort. No fever, chills, able to eat/drink okay, some sinus issues/sinus congestion. Negative strep. Not on any allergy pills.    Blood pressure 140/86, pulse 77, temperature 98.3 F (36.8 C), resp. rate 14, height 5\' 2"  (1.575 m), weight 166 lb (75.3 kg), SpO2 98 %.  Medications Current Outpatient Medications on File Prior to Visit  Medication Sig  . atenolol (TENORMIN) 100 MG tablet Take 1 tablet (100 mg total) by mouth daily.  . fenofibrate micronized (LOFIBRA) 134 MG capsule TAKE ONE CAPSULE BY MOUTH EVERY DAY BEFORE BREAKFAST  . hydrochlorothiazide (HYDRODIURIL) 25 MG tablet Take 1 tablet (25 mg total) by mouth daily.  Marland Kitchen levothyroxine (SYNTHROID, LEVOTHROID) 112 MCG tablet TAKE 1 TABLET EVERY DAY BEFORE BREAKFAST   No current facility-administered medications on file prior to visit.     Problem list She has Hypertension; Hypothyroidism; Hyperlipidemia; Irregular periods/menstrual cycles; Vitamin D deficiency; Medication management; Prediabetes; and Obesity (BMI 30.0-34.9) on their problem list.   Review of Systems  Constitutional: Negative for chills, diaphoresis, fatigue and fever.  HENT: Positive for congestion, sinus pressure and sore throat. Negative for ear pain and sneezing.   Respiratory: Negative.  Negative for cough and shortness of breath.   Cardiovascular: Negative.   Musculoskeletal: Negative for neck pain.  Neurological: Negative.  Negative for headaches.       Objective:   Physical Exam  Constitutional: She appears well-developed and well-nourished.  HENT:  Head: Normocephalic and atraumatic.  Right Ear: External ear normal.   Left Ear: External ear normal.  Mouth/Throat: Uvula is midline and mucous membranes are normal. Posterior oropharyngeal edema and posterior oropharyngeal erythema present.  Eyes: Pupils are equal, round, and reactive to light. Conjunctivae and EOM are normal.  Neck: Normal range of motion. Neck supple.  Cardiovascular: Normal rate, regular rhythm and normal heart sounds.  Pulmonary/Chest: Effort normal and breath sounds normal.  Abdominal: Soft. Bowel sounds are normal.  Lymphadenopathy:    She has no cervical adenopathy.  Skin: Skin is warm and dry.        Assessment & Plan:  Viral pharyngitis Negative strep Prednisone, salt water gargles If not better contact the office

## 2018-01-28 ENCOUNTER — Ambulatory Visit: Payer: Self-pay | Admitting: Internal Medicine

## 2018-02-03 ENCOUNTER — Encounter: Payer: Self-pay | Admitting: Internal Medicine

## 2018-02-03 DIAGNOSIS — Z8249 Family history of ischemic heart disease and other diseases of the circulatory system: Secondary | ICD-10-CM | POA: Insufficient documentation

## 2018-02-03 DIAGNOSIS — E1122 Type 2 diabetes mellitus with diabetic chronic kidney disease: Secondary | ICD-10-CM | POA: Insufficient documentation

## 2018-02-03 DIAGNOSIS — N182 Chronic kidney disease, stage 2 (mild): Secondary | ICD-10-CM | POA: Insufficient documentation

## 2018-02-03 NOTE — Patient Instructions (Signed)

## 2018-02-03 NOTE — Progress Notes (Signed)
This very nice 52 y.o. MWF presents for 6 month follow up with HTN, HLD, Pre-DM/Ins Resistance, Hypothyroidism and Vitamin D Deficiency.      Patient is treated for HTN (2000)  & BP has been sporadically controlled. Today's BP is elevated at 164/84 and rechecked at 160/105. Patient has CKD2 (GFR 75) consequent of her BP. Patient has had no complaints of any cardiac type chest pain, palpitations, dyspnea / orthopnea / PND, dizziness, claudication, or dependent edema.     Hyperlipidemia is notcontrolled with diet, meds and sub-optimal compliance. Patient denies myalgias or other med SE's. Last Lipids were not secure  With elevated Trig's obscuring LDL: Lab Results  Component Value Date   CHOL 193 10/24/2017   HDL 29 (L) 10/24/2017   LDLCALC not calculated 10/24/2017   TRIG 527 (H) 10/24/2017   CHOLHDL 6.7 (H) 10/24/2017      Also, the patient has history of Obesity (BMI 31+) and PreDiabetes/Insulin Resistance (A1c-5.7%w/elev insulin"41"/2015)  and has had no symptoms of reactive hypoglycemia, diabetic polys, paresthesias or visual blurring.  Last A1c was in early diabetic range: Lab Results  Component Value Date   HGBA1C 6.6 (H) 10/24/2017         Patient  has been on thyroid  replacement therapy simce dx'd Hypothyroid in 1997. since.     Further, the patient also has history of Vitamin D Deficiency ("20"/2013 & "16"/2014)  And does not supplement vitamin D as repeated advised. Last vitamin D was still very low:  Lab Results  Component Value Date   VD25OH 22 (L) 07/03/2017   Current Outpatient Medications on File Prior to Visit  Medication Sig  . atenolol (TENORMIN) 100 MG tablet Take 1 tablet (100 mg total) by mouth daily.  . fenofibrate micronized (LOFIBRA) 134 MG capsule TAKE ONE CAPSULE BY MOUTH EVERY DAY BEFORE BREAKFAST  . hydrochlorothiazide (HYDRODIURIL) 25 MG tablet Take 1 tablet (25 mg total) by mouth daily.  Marland Kitchen levothyroxine (SYNTHROID, LEVOTHROID) 112 MCG tablet TAKE 1  TABLET EVERY DAY BEFORE BREAKFAST   No current facility-administered medications on file prior to visit.    Allergies  Allergen Reactions  . Codeine   . Ppd [Tuberculin Purified Protein Derivative]     Positive reaction 2001  . Lisinopril     Dry cough   PMHx:   Past Medical History:  Diagnosis Date  . Hypertension   . Hypothyroidism    Immunization History  Administered Date(s) Administered  . Td 06/29/2001, 07/31/2012  . Tdap 07/31/2012   Past Surgical History:  Procedure Laterality Date  . LYMPH NODE DISSECTION     NECK- BENIGN   FHx:    Reviewed / unchanged  SHx:    Reviewed / unchanged   Systems Review:  Constitutional: Denies fever, chills, wt changes, headaches, insomnia, fatigue, night sweats, change in appetite. Eyes: Denies redness, blurred vision, diplopia, discharge, itchy, watery eyes.  ENT: Denies discharge, congestion, post nasal drip, epistaxis, sore throat, earache, hearing loss, dental pain, tinnitus, vertigo, sinus pain, snoring.  CV: Denies chest pain, palpitations, irregular heartbeat, syncope, dyspnea, diaphoresis, orthopnea, PND, claudication or edema. Respiratory: denies cough, dyspnea, DOE, pleurisy, hoarseness, laryngitis, wheezing.  Gastrointestinal: Denies dysphagia, odynophagia, heartburn, reflux, water brash, abdominal pain or cramps, nausea, vomiting, bloating, diarrhea, constipation, hematemesis, melena, hematochezia  or hemorrhoids. Genitourinary: Denies dysuria, frequency, urgency, nocturia, hesitancy, discharge, hematuria or flank pain. Musculoskeletal: Denies arthralgias, myalgias, stiffness, jt. swelling, pain, limping or strain/sprain.  Skin: Denies pruritus, rash, hives, warts,  acne, eczema or change in skin lesion(s). Neuro: No weakness, tremor, incoordination, spasms, paresthesia or pain. Psychiatric: Denies confusion, memory loss or sensory loss. Endo: Denies change in weight, skin or hair change.  Heme/Lymph: No excessive  bleeding, bruising or enlarged lymph nodes.  Physical Exam  BP (!) 166/84   Pulse 72   Temp 97.7 F (36.5 C)   Resp 16   Ht 5\' 2"  (1.575 m)   Wt 165 lb 3.2 oz (74.9 kg)   BMI 30.22 kg/m    BP rechecked at 160/105   Appears Over nourished, well groomed  and in no distress.  Eyes: PERRLA, EOMs, conjunctiva no swelling or erythema. Sinuses: No frontal/maxillary tenderness ENT/Mouth: EAC's clear, TM's nl w/o erythema, bulging. Nares clear w/o erythema, swelling, exudates. Oropharynx clear without erythema or exudates. Oral hygiene is good. Tongue normal, non obstructing. Hearing intact.  Neck: Supple. Thyroid not palpable. Car 2+/2+ without bruits, nodes or JVD. Chest: Respirations nl with BS clear & equal w/o rales, rhonchi, wheezing or stridor.  Cor: Heart sounds normal w/ regular rate and rhythm without sig. murmurs, gallops, clicks or rubs. Peripheral pulses normal and equal  without edema.  Abdomen: Soft & bowel sounds normal. Non-tender w/o guarding, rebound, hernias, masses or organomegaly.  Lymphatics: Unremarkable.  Musculoskeletal: Full ROM all peripheral extremities, joint stability, 5/5 strength and normal gait.  Skin: Warm, dry without exposed rashes, lesions or ecchymosis apparent.  Neuro: Cranial nerves intact, reflexes equal bilaterally. Sensory-motor testing grossly intact. Tendon reflexes grossly intact.  Pysch: Alert & oriented x 3.  Insight and judgement nl & appropriate. No ideations.  Assessment and Plan:  1. Essential hypertension  - Add Rx Benicar 40 mg - 1/2 to 1 tab qhs  - & call if BP's remain elevated   - Continue medication, monitor blood pressure at home.  - Continue DASH diet.  Reminder to go to the ER if any CP,  SOB, nausea, dizziness, severe HA, changes vision/speech.  - CBC with Differential/Platelet - COMPLETE METABOLIC PANEL WITH GFR - Magnesium - TSH  2. Hyperlipidemia, mixed  - Continue diet/meds, exercise,& lifestyle modifications.    - Continue monitor periodic cholesterol/liver & renal functions   - Lipid panel - TSH  3. Type 2 diabetes mellitus with stage 2 chronic kidney disease, without long-term current use of insulin (HCC)  - Continue diet, exercise, lifestyle modifications.  - Monitor appropriate labs.  - Hemoglobin A1c - Insulin, random  4. Vitamin D deficiency  - Continue supplementation.  - VITAMIN D 25 Hydroxyl  5. Hypothyroidism  - TSH  6. Medication management  - CBC with Differential/Platelet - COMPLETE METABOLIC PANEL WITH GFR - Magnesium - Lipid panel - Hemoglobin A1c - Insulin, random - VITAMIN D 25 Hydroxyl - TSH      Discussed  regular exercise, BP monitoring, weight control to achieve/maintain BMI less than 25 and discussed med and SE's. Recommended labs to assess and monitor clinical status with further disposition pending results of labs. Over 30 minutes of exam, counseling, chart review was performed.

## 2018-02-04 ENCOUNTER — Ambulatory Visit (INDEPENDENT_AMBULATORY_CARE_PROVIDER_SITE_OTHER): Payer: BLUE CROSS/BLUE SHIELD | Admitting: Internal Medicine

## 2018-02-04 ENCOUNTER — Encounter: Payer: Self-pay | Admitting: Internal Medicine

## 2018-02-04 VITALS — BP 166/84 | HR 72 | Temp 97.7°F | Resp 16 | Ht 62.0 in | Wt 165.2 lb

## 2018-02-04 DIAGNOSIS — E782 Mixed hyperlipidemia: Secondary | ICD-10-CM | POA: Diagnosis not present

## 2018-02-04 DIAGNOSIS — N182 Chronic kidney disease, stage 2 (mild): Secondary | ICD-10-CM

## 2018-02-04 DIAGNOSIS — Z79899 Other long term (current) drug therapy: Secondary | ICD-10-CM

## 2018-02-04 DIAGNOSIS — I10 Essential (primary) hypertension: Secondary | ICD-10-CM

## 2018-02-04 DIAGNOSIS — E039 Hypothyroidism, unspecified: Secondary | ICD-10-CM | POA: Diagnosis not present

## 2018-02-04 DIAGNOSIS — E1122 Type 2 diabetes mellitus with diabetic chronic kidney disease: Secondary | ICD-10-CM | POA: Diagnosis not present

## 2018-02-04 DIAGNOSIS — E559 Vitamin D deficiency, unspecified: Secondary | ICD-10-CM

## 2018-02-04 MED ORDER — OLMESARTAN MEDOXOMIL 40 MG PO TABS: ORAL_TABLET | ORAL | 1 refills | Status: DC

## 2018-02-05 ENCOUNTER — Other Ambulatory Visit: Payer: Self-pay | Admitting: Internal Medicine

## 2018-02-05 DIAGNOSIS — N182 Chronic kidney disease, stage 2 (mild): Secondary | ICD-10-CM

## 2018-02-05 DIAGNOSIS — E1122 Type 2 diabetes mellitus with diabetic chronic kidney disease: Secondary | ICD-10-CM

## 2018-02-05 LAB — COMPLETE METABOLIC PANEL WITH GFR
AG Ratio: 1.6 (calc) (ref 1.0–2.5)
ALT: 42 U/L — ABNORMAL HIGH (ref 6–29)
AST: 23 U/L (ref 10–35)
Albumin: 4.6 g/dL (ref 3.6–5.1)
Alkaline phosphatase (APISO): 63 U/L (ref 33–130)
BUN: 10 mg/dL (ref 7–25)
CO2: 30 mmol/L (ref 20–32)
Calcium: 9.7 mg/dL (ref 8.6–10.4)
Chloride: 97 mmol/L — ABNORMAL LOW (ref 98–110)
Creat: 0.84 mg/dL (ref 0.50–1.05)
GFR, Est African American: 93 mL/min/{1.73_m2} (ref >=60)
GFR, Est Non African American: 80 mL/min/{1.73_m2} (ref >=60)
Globulin: 2.8 g/dL (calc) (ref 1.9–3.7)
Glucose, Bld: 167 mg/dL — ABNORMAL HIGH (ref 65–99)
Potassium: 3.5 mmol/L (ref 3.5–5.3)
Sodium: 135 mmol/L (ref 135–146)
Total Bilirubin: 0.4 mg/dL (ref 0.2–1.2)
Total Protein: 7.4 g/dL (ref 6.1–8.1)

## 2018-02-05 LAB — CBC WITH DIFFERENTIAL/PLATELET
Basophils Absolute: 44 cells/uL (ref 0–200)
Basophils Relative: 0.5 %
Eosinophils Absolute: 113 cells/uL (ref 15–500)
Eosinophils Relative: 1.3 %
HCT: 43 % (ref 35.0–45.0)
Hemoglobin: 14.5 g/dL (ref 11.7–15.5)
Lymphs Abs: 2488 cells/uL (ref 850–3900)
MCH: 29.3 pg (ref 27.0–33.0)
MCHC: 33.7 g/dL (ref 32.0–36.0)
MCV: 86.9 fL (ref 80.0–100.0)
MPV: 10.5 fL (ref 7.5–12.5)
Monocytes Relative: 7.5 %
Neutro Abs: 5403 cells/uL (ref 1500–7800)
Neutrophils Relative %: 62.1 %
Platelets: 314 10*3/uL (ref 140–400)
RBC: 4.95 10*6/uL (ref 3.80–5.10)
RDW: 13.1 % (ref 11.0–15.0)
Total Lymphocyte: 28.6 %
WBC mixed population: 653 cells/uL (ref 200–950)
WBC: 8.7 10*3/uL (ref 3.8–10.8)

## 2018-02-05 LAB — HEMOGLOBIN A1C
Hgb A1c MFr Bld: 7.1 % of total Hgb — ABNORMAL HIGH (ref <5.7)
Mean Plasma Glucose: 157 (calc)
eAG (mmol/L): 8.7 (calc)

## 2018-02-05 LAB — LIPID PANEL
Cholesterol: 201 mg/dL — ABNORMAL HIGH (ref <200)
HDL: 27 mg/dL — ABNORMAL LOW (ref >50)
Non-HDL Cholesterol (Calc): 174 mg/dL (calc) — ABNORMAL HIGH (ref <130)
Total CHOL/HDL Ratio: 7.4 (calc) — ABNORMAL HIGH (ref <5.0)
Triglycerides: 905 mg/dL — ABNORMAL HIGH (ref <150)

## 2018-02-05 LAB — TSH: TSH: 4.09 mIU/L

## 2018-02-05 LAB — MAGNESIUM: Magnesium: 2.1 mg/dL (ref 1.5–2.5)

## 2018-02-05 LAB — VITAMIN D 25 HYDROXY (VIT D DEFICIENCY, FRACTURES): Vit D, 25-Hydroxy: 18 ng/mL — ABNORMAL LOW (ref 30–100)

## 2018-02-05 LAB — INSULIN, RANDOM: Insulin: 13.7 u[IU]/mL (ref 2.0–19.6)

## 2018-02-05 MED ORDER — METFORMIN HCL ER 500 MG PO TB24: ORAL_TABLET | ORAL | 1 refills | Status: DC

## 2018-02-11 ENCOUNTER — Telehealth: Payer: Self-pay | Admitting: *Deleted

## 2018-02-11 NOTE — Telephone Encounter (Signed)
Patient called and expressed concern about starting the Metformin RX.  She states she has not been taking her Fenofibrate and will restart it and change her diet. She wants to delay starting the Metformin until after her 04/2018 OV. Dr Oneta Rack is aware.

## 2018-02-15 LAB — HM DIABETES EYE EXAM

## 2018-03-27 ENCOUNTER — Encounter: Payer: Self-pay | Admitting: Women's Health

## 2018-03-27 ENCOUNTER — Ambulatory Visit (INDEPENDENT_AMBULATORY_CARE_PROVIDER_SITE_OTHER): Payer: BLUE CROSS/BLUE SHIELD | Admitting: Women's Health

## 2018-03-27 VITALS — BP 132/80 | Ht 62.0 in | Wt 165.0 lb

## 2018-03-27 DIAGNOSIS — Z01419 Encounter for gynecological examination (general) (routine) without abnormal findings: Secondary | ICD-10-CM

## 2018-03-27 NOTE — Addendum Note (Signed)
Addended by: Becky SaxPARKER, Makenleigh Crownover M on: 03/27/2018 10:20 AM   Modules accepted: Orders

## 2018-03-27 NOTE — Patient Instructions (Addendum)
[facility] [phone] Dr Carlean Purl    Carbohydrate Counting for Diabetes Mellitus, Adult Carbohydrate counting is a method for keeping track of how many carbohydrates you eat. Eating carbohydrates naturally increases the amount of sugar (glucose) in the blood. Counting how many carbohydrates you eat helps keep your blood glucose within normal limits, which helps you manage your diabetes (diabetes mellitus). It is important to know how many carbohydrates you can safely have in each meal. This is different for every person. A diet and nutrition specialist (registered dietitian) can help you make a meal plan and calculate how many carbohydrates you should have at each meal and snack. Carbohydrates are found in the following foods:  Grains, such as breads and cereals.  Dried beans and soy products.  Starchy vegetables, such as potatoes, peas, and corn.  Fruit and fruit juices.  Milk and yogurt.  Sweets and snack foods, such as cake, cookies, candy, chips, and soft drinks.  How do I count carbohydrates? There are two ways to count carbohydrates in food. You can use either of the methods or a combination of both. Reading "Nutrition Facts" on packaged food The "Nutrition Facts" list is included on the labels of almost all packaged foods and beverages in the [place] It includes:  The serving size.  Information about nutrients in each serving, including the grams (g) of carbohydrate per serving.  To use the "Nutrition Facts":  Decide how many servings you will have.  Multiply the number of servings by the number of carbohydrates per serving.  The resulting number is the total amount of carbohydrates that you will be having.  Learning standard serving sizes of other foods When you eat foods containing carbohydrates that are not packaged or do not include "Nutrition Facts" on the label, you need to measure the servings in order to count the amount of carbohydrates:  Measure the  foods that you will eat with a food scale or measuring cup, if needed.  Decide how many standard-size servings you will eat.  Multiply the number of servings by 15. Most carbohydrate-rich foods have about 15 g of carbohydrates per serving. ? For example, if you eat 8 oz (170 g) of strawberries, you will have eaten 2 servings and 30 g of carbohydrates (2 servings x 15 g = 30 g).  For foods that have more than one food mixed, such as soups and casseroles, you must count the carbohydrates in each food that is included.  The following list contains standard serving sizes of common carbohydrate-rich foods. Each of these servings has about 15 g of carbohydrates:   hamburger bun or  English muffin.   oz (15 mL) syrup.   oz (14 g) jelly.  1 slice of bread.  1 six-inch tortilla.  3 oz (85 g) cooked rice or pasta.  4 oz (113 g) cooked dried beans.  4 oz (113 g) starchy vegetable, such as peas, corn, or potatoes.  4 oz (113 g) hot cereal.  4 oz (113 g) mashed potatoes or  of a large baked potato.  4 oz (113 g) canned or frozen fruit.  4 oz (120 mL) fruit juice.  4-6 crackers.  6 chicken nuggets.  6 oz (170 g) unsweetened dry cereal.  6 oz (170 g) plain fat-free yogurt or yogurt sweetened with artificial sweeteners.  8 oz (240 mL) milk.  8 oz (170 g) fresh fruit or one small piece of fruit.  24 oz (680 g) popped popcorn.  Example of carbohydrate counting  Sample meal  3 oz (85 g) chicken breast.  6 oz (170 g) brown rice.  4 oz (113 g) corn.  8 oz (240 mL) milk.  8 oz (170 g) strawberries with sugar-free whipped topping. Carbohydrate calculation 1. Identify the foods that contain carbohydrates: ? Rice. ? Corn. ? Milk. ? Strawberries. 2. Calculate how many servings you have of each food: ? 2 servings rice. ? 1 serving corn. ? 1 serving milk. ? 1 serving strawberries. 3. Multiply each number of servings by 15 g: ? 2 servings rice x 15 g = 30 g. ? 1  serving corn x 15 g = 15 g. ? 1 serving milk x 15 g = 15 g. ? 1 serving strawberries x 15 g = 15 g. 4. Add together all of the amounts to find the total grams of carbohydrates eaten: ? 30 g + 15 g + 15 g + 15 g = 75 g of carbohydrates total. This information is not intended to replace advice given to you by your health care provider. Make sure you discuss any questions you have with your health care provider. Document Released: 05/01/2005 Document Revised: 11/19/2015 Document Reviewed: 10/13/2015 Elsevier Interactive Patient Education  2018 [place] Maintenance for Postmenopausal Women Menopause is a normal process in which your reproductive ability comes to an end. This process happens gradually over a span of months to years, usually between the ages of 77 and 57. Menopause is complete when you have missed 12 consecutive menstrual periods. It is important to talk with your health care provider about some of the most common conditions that affect postmenopausal women, such as heart disease, cancer, and bone loss (osteoporosis). Adopting a healthy lifestyle and getting preventive care can help to promote your health and wellness. Those actions can also lower your chances of developing some of these common conditions. What should I know about menopause? During menopause, you may experience a number of symptoms, such as:  Moderate-to-severe hot flashes.  Night sweats.  Decrease in sex drive.  Mood swings.  Headaches.  Tiredness.  Irritability.  Memory problems.  Insomnia.  Choosing to treat or not to treat menopausal changes is an individual decision that you make with your health care provider. What should I know about hormone replacement therapy and supplements? Hormone therapy products are effective for treating symptoms that are associated with menopause, such as hot flashes and night sweats. Hormone replacement carries certain risks, especially as you become older.  If you are thinking about using estrogen or estrogen with progestin treatments, discuss the benefits and risks with your health care provider. What should I know about heart disease and stroke? Heart disease, heart attack, and stroke become more likely as you age. This may be due, in part, to the hormonal changes that your body experiences during menopause. These can affect how your body processes dietary fats, triglycerides, and cholesterol. Heart attack and stroke are both medical emergencies. There are many things that you can do to help prevent heart disease and stroke:  Have your blood pressure checked at least every 1-2 years. High blood pressure causes heart disease and increases the risk of stroke.  If you are 48-4 years old, ask your health care provider if you should take aspirin to prevent a heart attack or a stroke.  Do not use any tobacco products, including cigarettes, chewing tobacco, or electronic cigarettes. If you need help quitting, ask your health care provider.  It is important to eat a healthy  diet and maintain a healthy weight. ? Be sure to include plenty of vegetables, fruits, low-fat dairy products, and lean protein. ? Avoid eating foods that are high in solid fats, added sugars, or salt (sodium).  Get regular exercise. This is one of the most important things that you can do for your health. ? Try to exercise for at least 150 minutes each week. The type of exercise that you do should increase your heart rate and make you sweat. This is known as moderate-intensity exercise. ? Try to do strengthening exercises at least twice each week. Do these in addition to the moderate-intensity exercise.  Know your numbers.Ask your health care provider to check your cholesterol and your blood glucose. Continue to have your blood tested as directed by your health care provider.  What should I know about cancer screening? There are several types of cancer. Take the following steps to  reduce your risk and to catch any cancer development as early as possible. Breast Cancer  Practice breast self-awareness. ? This means understanding how your breasts normally appear and feel. ? It also means doing regular breast self-exams. Let your health care provider know about any changes, no matter how small.  If you are 54 or older, have a clinician do a breast exam (clinical breast exam or CBE) every year. Depending on your age, family history, and medical history, it may be recommended that you also have a yearly breast X-ray (mammogram).  If you have a family history of breast cancer, talk with your health care provider about genetic screening.  If you are at high risk for breast cancer, talk with your health care provider about having an MRI and a mammogram every year.  Breast cancer (BRCA) gene test is recommended for women who have family members with BRCA-related cancers. Results of the assessment will determine the need for genetic counseling and BRCA1 and for BRCA2 testing. BRCA-related cancers include these types: ? Breast. This occurs in males or females. ? Ovarian. ? Tubal. This may also be called fallopian tube cancer. ? Cancer of the abdominal or pelvic lining (peritoneal cancer). ? Prostate. ? Pancreatic.  Cervical, Uterine, and Ovarian Cancer Your health care provider may recommend that you be screened regularly for cancer of the pelvic organs. These include your ovaries, uterus, and vagina. This screening involves a pelvic exam, which includes checking for microscopic changes to the surface of your cervix (Pap test).  For women ages 21-65, health care providers may recommend a pelvic exam and a Pap test every three years. For women ages 64-65, they may recommend the Pap test and pelvic exam, combined with testing for human papilloma virus (HPV), every five years. Some types of HPV increase your risk of cervical cancer. Testing for HPV may also be done on women of any  age who have unclear Pap test results.  Other health care providers may not recommend any screening for nonpregnant women who are considered low risk for pelvic cancer and have no symptoms. Ask your health care provider if a screening pelvic exam is right for you.  If you have had past treatment for cervical cancer or a condition that could lead to cancer, you need Pap tests and screening for cancer for at least 20 years after your treatment. If Pap tests have been discontinued for you, your risk factors (such as having a new sexual partner) need to be reassessed to determine if you should start having screenings again. Some women have medical problems that  increase the chance of getting cervical cancer. In these cases, your health care provider may recommend that you have screening and Pap tests more often.  If you have a family history of uterine cancer or ovarian cancer, talk with your health care provider about genetic screening.  If you have vaginal bleeding after reaching menopause, tell your health care provider.  There are currently no reliable tests available to screen for ovarian cancer.  Lung Cancer Lung cancer screening is recommended for adults 48-46 years old who are at high risk for lung cancer because of a history of smoking. A yearly low-dose CT scan of the lungs is recommended if you:  Currently smoke.  Have a history of at least 30 pack-years of smoking and you currently smoke or have quit within the past 15 years. A pack-year is smoking an average of one pack of cigarettes per day for one year.  Yearly screening should:  Continue until it has been 15 years since you quit.  Stop if you develop a health problem that would prevent you from having lung cancer treatment.  Colorectal Cancer  This type of cancer can be detected and can often be prevented.  Routine colorectal cancer screening usually begins at age 26 and continues through age 67.  If you have risk factors  for colon cancer, your health care provider may recommend that you be screened at an earlier age.  If you have a family history of colorectal cancer, talk with your health care provider about genetic screening.  Your health care provider may also recommend using home test kits to check for hidden blood in your stool.  A small camera at the end of a tube can be used to examine your colon directly (sigmoidoscopy or colonoscopy). This is done to check for the earliest forms of colorectal cancer.  Direct examination of the colon should be repeated every 5-10 years until age 21. However, if early forms of precancerous polyps or small growths are found or if you have a family history or genetic risk for colorectal cancer, you may need to be screened more often.  Skin Cancer  Check your skin from head to toe regularly.  Monitor any moles. Be sure to tell your health care provider: ? About any new moles or changes in moles, especially if there is a change in a mole's shape or color. ? If you have a mole that is larger than the size of a pencil eraser.  If any of your family members has a history of skin cancer, especially at a young age, talk with your health care provider about genetic screening.  Always use sunscreen. Apply sunscreen liberally and repeatedly throughout the day.  Whenever you are outside, protect yourself by wearing long sleeves, pants, a wide-brimmed hat, and sunglasses.  What should I know about osteoporosis? Osteoporosis is a condition in which bone destruction happens more quickly than new bone creation. After menopause, you may be at an increased risk for osteoporosis. To help prevent osteoporosis or the bone fractures that can happen because of osteoporosis, the following is recommended:  If you are 51-60 years old, get at least 1,000 mg of calcium and at least 600 mg of vitamin D per day.  If you are older than age 60 but younger than age 17, get at least 1,200 mg of  calcium and at least 600 mg of vitamin D per day.  If you are older than age 13, get at least 1,200 mg of calcium  and at least 800 mg of vitamin D per day.  Smoking and excessive alcohol intake increase the risk of osteoporosis. Eat foods that are rich in calcium and vitamin D, and do weight-bearing exercises several times each week as directed by your health care provider. What should I know about how menopause affects my mental health? Depression may occur at any age, but it is more common as you become older. Common symptoms of depression include:  Low or sad mood.  Changes in sleep patterns.  Changes in appetite or eating patterns.  Feeling an overall lack of motivation or enjoyment of activities that you previously enjoyed.  Frequent crying spells.  Talk with your health care provider if you think that you are experiencing depression. What should I know about immunizations? It is important that you get and maintain your immunizations. These include:  Tetanus, diphtheria, and pertussis (Tdap) booster vaccine.  Influenza every year before the flu season begins.  Pneumonia vaccine.  Shingles vaccine.  Your health care provider may also recommend other immunizations. This information is not intended to replace advice given to you by your health care provider. Make sure you discuss any questions you have with your health care provider. Document Released: 06/23/2005 Document Revised: 11/19/2015 Document Reviewed: 02/02/2015 Elsevier Interactive Patient Education  2018 [employer].

## 2018-03-27 NOTE — Progress Notes (Signed)
Theophilus BonesSharon Lauter 09/25/1965 [id number]    History:    Presents for annual exam.  Postmenopausal on no HRT with no bleeding.  Normal Pap and mammogram history.  Primary care manages hypertension, diabetes and hypothyroidism.  Has not had a screening colonoscopy.  Past medical history, past surgical history, family history and social history were all reviewed and documented in the EPIC chart.  Watching 3644-month-old granddaughter BlissWillow daily. [name] 20 [facility] [employer] [facility] [employer] [facility] [employer], son out of school working.  Parents deceased, father from heart disease, mother from diabetes and lymphoma.  ROS:  A ROS was performed and pertinent positives and negatives are included.  Exam:  Vitals:   03/27/18 0801  BP: 132/80  Weight: 165 lb (74.8 kg)  Height: 5\' 2"  (1.575 m)   Body mass index is 30.18 kg/m.   General appearance:  Normal Thyroid:  Symmetrical, normal in size, without palpable masses or nodularity. Respiratory  Auscultation:  Clear without wheezing or rhonchi Cardiovascular  Auscultation:  Regular rate, without rubs, murmurs or gallops  Edema/varicosities:  Not grossly evident Abdominal  Soft,nontender, without masses, guarding or rebound.  Liver/spleen:  No organomegaly noted  Hernia:  None appreciated  Skin  Inspection:  Grossly normal   Breasts: Examined lying and sitting.     Right: Without masses, retractions, discharge or axillary adenopathy.     Left: Without masses, retractions, discharge or axillary adenopathy. Gentitourinary   Inguinal/mons:  Normal without inguinal adenopathy  External genitalia:  Normal  BUS/Urethra/Skene's glands:  Normal  Vagina:  Normal  Cervix:  Normal  Uterus:   normal in size, shape and contour.  Midline and mobile  Adnexa/parametria:     Rt: Without masses or tenderness.   Lt: Without masses or tenderness.  Anus and perineum: Normal  Digital rectal exam: Normal sphincter tone without palpated masses or tenderness  Assessment/Plan:  52 y.o. MWF G2 P2 for annual  exam with no complaints.  Postmenopausal/no HRT/no bleeding Hypertension, hypothyroidism, diabetes Obesity  Plan: SBE's, continue annual 3D screening mammogram due in December.  Reviewed importance of increasing regular exercise, decreasing calorie/carbs.  Vitamin D 2000 daily encouraged.  Screening colonoscopy reviewed and encouraged, [name] information given instructed to schedule.  Pap with HR HPV typing, new screening guidelines reviewed.   Harrington Challengerancy J Shanekqua Schaper [facility]NP, 8:29 AM 03/27/2018

## 2018-03-28 LAB — PAP, TP IMAGING W/ HPV RNA, RFLX HPV TYPE 16,18/45: HPV DNA High Risk: NOT DETECTED

## 2018-04-18 DIAGNOSIS — Z86018 Personal history of other benign neoplasm: Secondary | ICD-10-CM | POA: Diagnosis not present

## 2018-04-18 DIAGNOSIS — Z808 Family history of malignant neoplasm of other organs or systems: Secondary | ICD-10-CM | POA: Diagnosis not present

## 2018-04-18 DIAGNOSIS — L814 Other melanin hyperpigmentation: Secondary | ICD-10-CM | POA: Diagnosis not present

## 2018-04-18 DIAGNOSIS — D225 Melanocytic nevi of trunk: Secondary | ICD-10-CM | POA: Diagnosis not present

## 2018-04-23 ENCOUNTER — Other Ambulatory Visit: Payer: Self-pay | Admitting: Adult Health

## 2018-05-06 ENCOUNTER — Ambulatory Visit: Payer: Self-pay | Admitting: Physician Assistant

## 2018-05-08 NOTE — Progress Notes (Signed)
Assessment and Plan:  Essential hypertension - continue medications, DASH diet, exercise and monitor at home. Call if greater than 130/80.  -     CBC with Differential/Platelet -     COMPLETE METABOLIC PANEL WITH GFR -     TSH  Type 2 diabetes mellitus with stage 2 chronic kidney disease, without long-term current use of insulin (HCC) Discussed general issues about diabetes pathophysiology and management., Educational material distributed., Suggested low cholesterol diet., Encouraged aerobic exercise., Discussed foot care., Reminded to get yearly retinal exam. -     Hemoglobin A1c  Hypothyroidism, unspecified type Hypothyroidism-check TSH level, continue medications the same, reminded to take on an empty stomach 30-3560mins before food.  -     TSH  Hyperlipidemia, mixed check lipids decrease fatty foods increase activity.  -     Lipid panel  Medication management -     Magnesium    Continue diet and meds as discussed. Further disposition pending results of labs. Future Appointments  Date Time Provider Department Center  07/19/2018  9:00 AM Lucky CowboyMcKeown, William, MD [facility] None    HPI 52 y.o. female  presents for 6 month follow up  has a past medical history of Hypertension and Hypothyroidism.  Her blood pressure has been controlled at home, today their BP is BP: 124/86  She does not workout. She denies chest pain, shortness of breath, dizziness.  She is on cholesterol medication, fenofibrate takes every other day, she also drinks sweet tea and denies myalgias. Her cholesterol is not at goal. The cholesterol last visit was:   Lab Results  Component Value Date   CHOL 201 (H) 02/04/2018   HDL 27 (L) 02/04/2018   LDLCALC  02/04/2018     Comment:     . LDL cholesterol not calculated. Triglyceride levels greater than 400 mg/dL invalidate calculated LDL results. . Reference range: <100 . Desirable range <100 mg/dL for primary prevention;   <70 mg/dL for patients with CHD or  diabetic patients  with > or = 2 CHD risk factors. Marland Kitchen. LDL-C is now calculated using the Martin-Hopkins  calculation, which is a validated novel method providing  better accuracy than the Friedewald equation in the  estimation of LDL-C.  Horald PollenMartin SS et al. Lenox AhrJAMA. 1914;782(952013;310(19): 2061-2068  (http://education.[facility].com/faq/FAQ164)    TRIG 905 (H) 02/04/2018   CHOLHDL 7.4 (H) 02/04/2018   She has been working on diet and exercise for Diabetes with diabetic chronic kidney disease, she is not on bASA, she is not on ACE/ARB, and denies paresthesia of the feet, polydipsia and polyuria. SHE WOULD LIKE TO WORK ON DIET AND EXERCISE BEFORE MEDS. Last A1C was:  Lab Results  Component Value Date   HGBA1C 7.1 (H) 02/04/2018    Lab Results  Component Value Date   GFRNONAA 80 02/04/2018   Patient is on Vitamin D supplement.   Lab Results  Component Value Date   VD25OH 18 (L) 02/04/2018     BMI is Body mass index is 30.43 kg/m., she is working on diet and exercise. Wt Readings from Last 3 Encounters:  05/10/18 166 lb 6.4 oz (75.5 kg)  03/27/18 165 lb (74.8 kg)  02/04/18 165 lb 3.2 oz (74.9 kg)   She is on thyroid medication. Her medication was not changed last visit.   Lab Results  Component Value Date   TSH 4.09 02/04/2018  .   Current Medications:  Current Outpatient Medications on File Prior to Visit  Medication Sig Dispense Refill  . atenolol (TENORMIN)  100 MG tablet Take 1 tablet (100 mg total) by mouth daily. 90 tablet 3  . fenofibrate micronized (LOFIBRA) 134 MG capsule TAKE ONE CAPSULE BY MOUTH EVERY DAY BEFORE BREAKFAST 90 capsule 3  . hydrochlorothiazide (HYDRODIURIL) 25 MG tablet TAKE 1 TABLET BY MOUTH EVERY DAY 90 tablet 1  . levothyroxine (SYNTHROID, LEVOTHROID) 112 MCG tablet TAKE 1 TABLET EVERY DAY BEFORE BREAKFAST 90 tablet 3   No current facility-administered medications on file prior to visit.    Medical History:  Past Medical History:  Diagnosis Date  .  Hypertension   . Hypothyroidism    Allergies:  Allergies  Allergen Reactions  . Codeine   . Ppd [Tuberculin Purified Protein Derivative]     Positive reaction 2001  . Lisinopril     Dry cough     Review of Systems:  Review of Systems  Constitutional: Negative.   HENT: Negative.   Eyes: Negative.   Respiratory: Negative.   Cardiovascular: Negative.   Gastrointestinal: Negative.   Genitourinary: Negative.   Musculoskeletal: Negative.   Skin: Negative.   Neurological: Negative.   Endo/Heme/Allergies: Negative.   Psychiatric/Behavioral: Negative.     Family history- Review and unchanged Social history- Review and unchanged Physical Exam: BP 124/86   Pulse 66   Temp 98.5 F (36.9 C)   Ht 5\' 2"  (1.575 m)   Wt 166 lb 6.4 oz (75.5 kg)   SpO2 98%   BMI 30.43 kg/m  Wt Readings from Last 3 Encounters:  05/10/18 166 lb 6.4 oz (75.5 kg)  03/27/18 165 lb (74.8 kg)  02/04/18 165 lb 3.2 oz (74.9 kg)   General Appearance: Well nourished, in no apparent distress. Eyes: PERRLA, EOMs, conjunctiva no swelling or erythema Sinuses: No Frontal/maxillary tenderness ENT/Mouth: Ext aud canals clear, TMs without erythema, bulging. No erythema, swelling, or exudate on post pharynx.  Tonsils not swollen or erythematous. Hearing normal.  Neck: Supple, thyroid normal.  Respiratory: Respiratory effort normal, BS equal bilaterally without rales, rhonchi, wheezing or stridor.  Cardio: RRR with no MRGs. Brisk peripheral pulses without edema.  Abdomen: Soft, + BS,  Non tender, no guarding, rebound, hernias, masses. Lymphatics: Non tender without lymphadenopathy.  Musculoskeletal: Full ROM, 5/5 strength, Normal gait Skin: Warm, dry without rashes, lesions, ecchymosis.  Neuro: Cranial nerves intact. Normal muscle tone, no cerebellar symptoms. Psych: Awake and oriented X 3, normal affect, Insight and Judgment appropriate.    Quentin MullingAmanda Jawann Urbani, PA-C 10:34 AM [facility]

## 2018-05-10 ENCOUNTER — Ambulatory Visit (INDEPENDENT_AMBULATORY_CARE_PROVIDER_SITE_OTHER): Payer: BLUE CROSS/BLUE SHIELD | Admitting: Physician Assistant

## 2018-05-10 ENCOUNTER — Ambulatory Visit: Payer: Self-pay | Admitting: Physician Assistant

## 2018-05-10 ENCOUNTER — Encounter: Payer: Self-pay | Admitting: Physician Assistant

## 2018-05-10 VITALS — BP 124/86 | HR 66 | Temp 98.5°F | Ht 62.0 in | Wt 166.4 lb

## 2018-05-10 DIAGNOSIS — N182 Chronic kidney disease, stage 2 (mild): Secondary | ICD-10-CM

## 2018-05-10 DIAGNOSIS — Z79899 Other long term (current) drug therapy: Secondary | ICD-10-CM | POA: Diagnosis not present

## 2018-05-10 DIAGNOSIS — E039 Hypothyroidism, unspecified: Secondary | ICD-10-CM | POA: Diagnosis not present

## 2018-05-10 DIAGNOSIS — E782 Mixed hyperlipidemia: Secondary | ICD-10-CM | POA: Diagnosis not present

## 2018-05-10 DIAGNOSIS — I10 Essential (primary) hypertension: Secondary | ICD-10-CM

## 2018-05-10 DIAGNOSIS — E1122 Type 2 diabetes mellitus with diabetic chronic kidney disease: Secondary | ICD-10-CM | POA: Diagnosis not present

## 2018-05-10 NOTE — Patient Instructions (Addendum)
When it comes to diets, agreement about the perfect plan isn't easy to find, even among the experts. Experts at the [employer] developed an idea known as the Healthy Eating Plate. Just imagine a plate divided into logical, healthy portions.  The emphasis is on diet quality:  Load up on vegetables and fruits - one-half of your plate: Aim for color and variety, and remember that potatoes don't count.  Go for whole grains - one-quarter of your plate: Whole wheat, barley, wheat berries, quinoa, oats, brown rice, and foods made with them. If you want pasta, go with whole wheat pasta.  Protein power - one-quarter of your plate: Fish, chicken, beans, and nuts are all healthy, versatile protein sources. Limit red meat.  The diet, however, does go beyond the plate, offering a few other suggestions.  Use healthy plant oils, such as olive, canola, soy, corn, sunflower and peanut. Check the labels, and avoid partially hydrogenated oil, which have unhealthy trans fats.  If you're thirsty, drink water. Coffee and tea are good in moderation, but skip sugary drinks and limit milk and dairy products to one or two daily servings.  The type of carbohydrate in the diet is more important than the amount. Some sources of carbohydrates, such as vegetables, fruits, whole grains, and beans-are healthier than others.  Finally, stay active.     Bad carbs also include fruit juice, alcohol, and sweet tea. These are empty calories that do not signal to your brain that you are full.   Please remember the good carbs are still carbs which convert into sugar. So please measure them out no more than 1/2-1 cup of rice, oatmeal, pasta, and beans  Veggies are however free foods! Pile them on.   Not all fruit is created equal. Please see the list below, the fruit at the bottom is higher in sugars than the fruit at the top. Please avoid all dried fruits.      VITAMIN D IS IMPORTANT  Vitamin D  goal is between 60-80  Please make sure that you are taking your Vitamin D as directed.   It is very important as a natural anti-inflammatory   helping hair, skin, and nails, as well as reducing stroke and heart attack risk.   It helps your bones and helps with mood.  We want you on at least 5000 IU daily  It also decreases numerous cancer risks so please take it as directed.   Low Vit D is associated with a 200-300% higher risk for CANCER   and 200-300% higher risk for HEART   ATTACK  &  STROKE.    .....................................Marland Kitchen.  It is also associated with higher death rate at younger ages,   autoimmune diseases like Rheumatoid arthritis, Lupus, Multiple Sclerosis.     Also many other serious conditions, like depression, Alzheimer's  Dementia, infertility, muscle aches, fatigue, fibromyalgia - just to name a few.  +++++++++++++++++++  Can get liquid vitamin D from [place]  OR [place] at  [facility] [address], [place], [place] [id number] Or you can try earth fare  RANGE OF A1C   Your A1C is a measure of your sugar over the past 3 months and is not affected by what you have eaten over the past few days. Diabetes increases your chances of stroke and heart attack over 300 % and is the leading cause of blindness and kidney failure in the [place]. Please make sure you decrease bad  carbs like white bread, white rice, potatoes, corn, soft drinks, pasta, cereals, refined sugars, sweet tea, dried fruits, and fruit juice. Good carbs are okay to eat in moderation like sweet potatoes, brown rice, whole grain pasta/bread, most fruit (except dried fruit) and you can eat as many veggies as you want.   Greater than 6.5 is considered diabetic. Between 6.4 and 5.7 is prediabetic If your A1C is less than 5.7 you are NOT diabetic.  Targets for Glucose Readings: Time of Check Target for patients WITHOUT Diabetes Target for DIABETICS  Before Meals Less than 100   less than 150  Two hours after meals Less than 200  Less than 250    Diabetes is a very complicated disease...lets simplify it.  An easy way to look at it to understand the complications is if you think of the extra sugar floating in your blood stream as glass shards floating through your blood stream.    Diabetes affects your small vessels first: 1) The glass shards (sugar) scraps down the tiny blood vessels in your eyes and lead to diabetic retinopathy, the leading cause of blindness in the [place]. Diabetes is the leading cause of newly diagnosed adult (39 to 52 years of age) blindness in the [place].  2) The glass shards scratches down the tiny vessels of your legs leading to nerve damage called neuropathy and can lead to amputations of your feet. More than 60% of all non-traumatic amputations of lower limbs occur in people with diabetes.  3) Over time the small vessels in your brain are shredded and closed off, individually this does not cause any problems but over a long period of time many of the small vessels being blocked can lead to Vascular Dementia.   4) Your kidney's are a filter system and have a "net" that keeps certain things in the body and lets bad things out. Sugar shreds this net and leads to kidney damage and eventually failure. Decreasing the sugar that is destroying the net and certain blood pressure medications can help stop or decrease progression of kidney disease. Diabetes was the primary cause of kidney failure in 44 percent of all new cases in 2011.  5) Diabetes also destroys the small vessels in your penis that lead to erectile dysfunction. Eventually the vessels are so damaged that you may not be responsive to cialis or viagra.   Diabetes and your large vessels: Your larger vessels consist of your coronary arteries in your heart and the carotid vessels to your brain. Diabetes or even increased sugars put you at 300% increased risk of heart attack and stroke and  this is why.. The sugar scrapes down your large blood vessels and your body sees this as an internal injury and tries to repair itself. Just like you get a scab on your skin, your platelets will stick to the blood vessel wall trying to heal it. This is why we have diabetics on low dose aspirin daily, this prevents the platelets from sticking and can prevent plaque formation. In addition, your body takes cholesterol and tries to shove it into the open wound. This is why we want your LDL, or bad cholesterol, below 70.   The combination of platelets and cholesterol over 5-10 years forms plaque that can break off and cause a heart attack or stroke.   PLEASE REMEMBER:  Diabetes is preventable! Up to 85 percent of complications and morbidities among individuals with type 2 diabetes can be prevented, delayed, or effectively treated and  minimized with regular visits to a health professional, appropriate monitoring and medication, and a healthy diet and lifestyle.  We are starting you on Metformin to prevent or treat diabetes.  Metformin does NOT cause low blood sugars.   In order to create energy your cells need insulin and sugar but sometime your cells do not accept the insulin and this can cause increased sugars and decreased energy.   The Metformin helps your cells accept insulin and the sugar this help: 1) increase your energy  2) weight loss.    The two most common side effects are nausea and diarrhea, follow these rules to avoid it but these symptoms get better with time on the medication.    ALSO You can take imodium per box instructions when starting metformin if needed.   Rules of metformin: 1) start out slow with only one pill daily. Our goal for you is 4 pills a day or 2000mg  total.  2) take with your largest meal. 3) Take with least amount of carbs.   Call if you have any problems.

## 2018-05-11 LAB — COMPLETE METABOLIC PANEL WITH GFR
AG Ratio: 1.6 (calc) (ref 1.0–2.5)
ALT: 35 U/L — ABNORMAL HIGH (ref 6–29)
AST: 28 U/L (ref 10–35)
Albumin: 4.5 g/dL (ref 3.6–5.1)
Alkaline phosphatase (APISO): 57 U/L (ref 33–130)
BUN: 13 mg/dL (ref 7–25)
CO2: 29 mmol/L (ref 20–32)
Calcium: 10 mg/dL (ref 8.6–10.4)
Chloride: 99 mmol/L (ref 98–110)
Creat: 0.78 mg/dL (ref 0.50–1.05)
GFR, Est African American: 101 mL/min/{1.73_m2} (ref >=60)
GFR, Est Non African American: 87 mL/min/{1.73_m2} (ref >=60)
Globulin: 2.9 g/dL (calc) (ref 1.9–3.7)
Glucose, Bld: 135 mg/dL — ABNORMAL HIGH (ref 65–99)
Potassium: 4.5 mmol/L (ref 3.5–5.3)
Sodium: 137 mmol/L (ref 135–146)
Total Bilirubin: 0.5 mg/dL (ref 0.2–1.2)
Total Protein: 7.4 g/dL (ref 6.1–8.1)

## 2018-05-11 LAB — CBC WITH DIFFERENTIAL/PLATELET
Absolute Monocytes: 587 cells/uL (ref 200–950)
Basophils Absolute: 43 cells/uL (ref 0–200)
Basophils Relative: 0.5 %
Eosinophils Absolute: 153 cells/uL (ref 15–500)
Eosinophils Relative: 1.8 %
HCT: 41.2 % (ref 35.0–45.0)
Hemoglobin: 14.3 g/dL (ref 11.7–15.5)
Lymphs Abs: 2593 cells/uL (ref 850–3900)
MCH: 30.2 pg (ref 27.0–33.0)
MCHC: 34.7 g/dL (ref 32.0–36.0)
MCV: 87.1 fL (ref 80.0–100.0)
MPV: 9.9 fL (ref 7.5–12.5)
Monocytes Relative: 6.9 %
Neutro Abs: 5126 cells/uL (ref 1500–7800)
Neutrophils Relative %: 60.3 %
Platelets: 284 10*3/uL (ref 140–400)
RBC: 4.73 10*6/uL (ref 3.80–5.10)
RDW: 13 % (ref 11.0–15.0)
Total Lymphocyte: 30.5 %
WBC: 8.5 10*3/uL (ref 3.8–10.8)

## 2018-05-11 LAB — LIPID PANEL
Cholesterol: 202 mg/dL — ABNORMAL HIGH (ref <200)
HDL: 32 mg/dL — ABNORMAL LOW (ref >50)
Non-HDL Cholesterol (Calc): 170 mg/dL (calc) — ABNORMAL HIGH (ref <130)
Total CHOL/HDL Ratio: 6.3 (calc) — ABNORMAL HIGH (ref <5.0)
Triglycerides: 465 mg/dL — ABNORMAL HIGH (ref <150)

## 2018-05-11 LAB — HEMOGLOBIN A1C
Hgb A1c MFr Bld: 7.2 % of total Hgb — ABNORMAL HIGH (ref <5.7)
Mean Plasma Glucose: 160 (calc)
eAG (mmol/L): 8.9 (calc)

## 2018-05-11 LAB — TSH: TSH: 3.22 mIU/L

## 2018-05-11 LAB — MAGNESIUM: Magnesium: 2.1 mg/dL (ref 1.5–2.5)

## 2018-05-16 ENCOUNTER — Other Ambulatory Visit: Payer: Self-pay | Admitting: Internal Medicine

## 2018-05-16 DIAGNOSIS — I10 Essential (primary) hypertension: Secondary | ICD-10-CM

## 2018-07-19 ENCOUNTER — Encounter: Payer: Self-pay | Admitting: Internal Medicine

## 2018-08-21 ENCOUNTER — Encounter: Payer: Self-pay | Admitting: Internal Medicine

## 2018-09-20 ENCOUNTER — Other Ambulatory Visit: Payer: Self-pay | Admitting: Internal Medicine

## 2018-09-20 DIAGNOSIS — E039 Hypothyroidism, unspecified: Secondary | ICD-10-CM

## 2018-10-17 ENCOUNTER — Other Ambulatory Visit: Payer: Self-pay | Admitting: Adult Health

## 2018-10-22 ENCOUNTER — Other Ambulatory Visit: Payer: Self-pay | Admitting: Internal Medicine

## 2018-10-22 DIAGNOSIS — E782 Mixed hyperlipidemia: Secondary | ICD-10-CM

## 2018-11-05 ENCOUNTER — Ambulatory Visit (INDEPENDENT_AMBULATORY_CARE_PROVIDER_SITE_OTHER): Payer: BC Managed Care – PPO | Admitting: Internal Medicine

## 2018-11-05 ENCOUNTER — Other Ambulatory Visit: Payer: Self-pay

## 2018-11-05 ENCOUNTER — Encounter: Payer: Self-pay | Admitting: Internal Medicine

## 2018-11-05 VITALS — BP 138/84 | HR 68 | Temp 97.2°F | Resp 16 | Ht 62.0 in | Wt 162.0 lb

## 2018-11-05 DIAGNOSIS — E782 Mixed hyperlipidemia: Secondary | ICD-10-CM | POA: Diagnosis not present

## 2018-11-05 DIAGNOSIS — R5383 Other fatigue: Secondary | ICD-10-CM

## 2018-11-05 DIAGNOSIS — Z1329 Encounter for screening for other suspected endocrine disorder: Secondary | ICD-10-CM

## 2018-11-05 DIAGNOSIS — Z136 Encounter for screening for cardiovascular disorders: Secondary | ICD-10-CM | POA: Diagnosis not present

## 2018-11-05 DIAGNOSIS — Z79899 Other long term (current) drug therapy: Secondary | ICD-10-CM | POA: Diagnosis not present

## 2018-11-05 DIAGNOSIS — Z131 Encounter for screening for diabetes mellitus: Secondary | ICD-10-CM

## 2018-11-05 DIAGNOSIS — Z1389 Encounter for screening for other disorder: Secondary | ICD-10-CM | POA: Diagnosis not present

## 2018-11-05 DIAGNOSIS — Z130 Encounter for screening for diseases of the blood and blood-forming organs and certain disorders involving the immune mechanism: Secondary | ICD-10-CM

## 2018-11-05 DIAGNOSIS — Z0000 Encounter for general adult medical examination without abnormal findings: Secondary | ICD-10-CM | POA: Diagnosis not present

## 2018-11-05 DIAGNOSIS — N182 Chronic kidney disease, stage 2 (mild): Secondary | ICD-10-CM

## 2018-11-05 DIAGNOSIS — I10 Essential (primary) hypertension: Secondary | ICD-10-CM | POA: Diagnosis not present

## 2018-11-05 DIAGNOSIS — Z13220 Encounter for screening for lipoid disorders: Secondary | ICD-10-CM

## 2018-11-05 DIAGNOSIS — E559 Vitamin D deficiency, unspecified: Secondary | ICD-10-CM | POA: Diagnosis not present

## 2018-11-05 DIAGNOSIS — Z1211 Encounter for screening for malignant neoplasm of colon: Secondary | ICD-10-CM

## 2018-11-05 DIAGNOSIS — Z20828 Contact with and (suspected) exposure to other viral communicable diseases: Secondary | ICD-10-CM | POA: Diagnosis not present

## 2018-11-05 DIAGNOSIS — Z0001 Encounter for general adult medical examination with abnormal findings: Secondary | ICD-10-CM

## 2018-11-05 DIAGNOSIS — Z111 Encounter for screening for respiratory tuberculosis: Secondary | ICD-10-CM

## 2018-11-05 DIAGNOSIS — Z8249 Family history of ischemic heart disease and other diseases of the circulatory system: Secondary | ICD-10-CM

## 2018-11-05 DIAGNOSIS — E1122 Type 2 diabetes mellitus with diabetic chronic kidney disease: Secondary | ICD-10-CM

## 2018-11-05 DIAGNOSIS — E039 Hypothyroidism, unspecified: Secondary | ICD-10-CM

## 2018-11-05 DIAGNOSIS — Z20822 Contact with and (suspected) exposure to covid-19: Secondary | ICD-10-CM

## 2018-11-05 NOTE — Patient Instructions (Signed)
- Vit D  And Vit C 1,000 mg  are recommended to help protect  against the Covid_19 and other Corona viruses.   - Also it's recommended to take Zinc 50 mg to help  protect against the Covid_19  And best place to get  is also on [email or web address] and don't pay more than 6-8 cents /pill !   ================================ Coronavirus (COVID-19) Are you at risk?  Are you at risk for the Coronavirus (COVID-19)?  To be considered HIGH RISK for Coronavirus (COVID-19), you have to meet the following criteria:  . Traveled to [place], [place], [place], [place] or [place]; or in the [place] to [place], [place], [place]  . or [place]; and have fever, cough, and shortness of breath within the last 2 weeks of travel OR . Been in close contact with a person diagnosed with COVID-19 within the last 2 weeks and have  . fever, cough,and shortness of breath .  . IF YOU DO NOT MEET THESE CRITERIA, YOU ARE CONSIDERED LOW RISK FOR COVID-19.  What to do if you are HIGH RISK for COVID-19?  Marland Kitchen If you are having a medical emergency, call 911. . Seek medical care right away. Before you go to a doctor's office, urgent care or emergency department, .  call ahead and tell them about your recent travel, contact with someone diagnosed with COVID-19  .  and your symptoms.  . You should receive instructions from your physician's office regarding next steps of care.  . When you arrive at healthcare provider, tell the healthcare staff immediately you have returned from  . visiting [place], [place], [place], [place] or [place]; or traveled in the [place] to [place], [place],  . [place] or [place] in the last two weeks or you have been in close contact with a person diagnosed with  . COVID-19 in the last 2 weeks.   . Tell the health care staff about your symptoms: fever, cough and shortness of breath. . After you have been seen by a medical provider, you will be either: o Tested for (COVID-19) and  discharged home on quarantine except to seek medical care if  o symptoms worsen, and asked to  - Stay home and avoid contact with others until you get your results (4-5 days)  - Avoid travel on public transportation if possible (such as bus, train, or airplane) or o Sent to the Emergency Department by EMS for evaluation, COVID-19 testing  and  o possible admission depending on your condition and test results.  What to do if you are LOW RISK for COVID-19?  Reduce your risk of any infection by using the same precautions used for avoiding the common cold or flu:  Marland Kitchen Wash your hands often with soap and warm water for at least 20 seconds.  If soap and water are not readily available,  . use an alcohol-based hand sanitizer with at least 60% alcohol.  . If coughing or sneezing, cover your mouth and nose by coughing or sneezing into the elbow areas of your shirt or coat, .  into a tissue or into your sleeve (not your hands). . Avoid shaking hands with others and consider head nods or verbal greetings only. . Avoid touching your eyes, nose, or mouth with unwashed hands.  . Avoid close contact with people who are sick. . Avoid places or events with large numbers of people in one location, like concerts or sporting events. . Carefully consider travel plans  you have or are making. . If you are planning any travel outside or inside the [place], visit the CDC's Travelers' Health webpage for the latest health notices. . If you have some symptoms but not all symptoms, continue to monitor at home and seek medical attention  . if your symptoms worsen. . If you are having a medical emergency, call 911. >>>>>>>>>>>>>>>>>>>>>>> Preventive Care for Adults  A healthy lifestyle and preventive care can promote health and wellness. Preventive health guidelines for women include the following key practices.  A routine yearly physical is a good way to check with your health care provider about your health and preventive  screening. It is a chance to share any concerns and updates on your health and to receive a thorough exam.  Visit your dentist for a routine exam and preventive care every 6 months. Brush your teeth twice a day and floss once a day. Good oral hygiene prevents tooth decay and gum disease.  The frequency of eye exams is based on your age, health, family medical history, use of contact lenses, and other factors. Follow your health care provider's recommendations for frequency of eye exams.  Eat a healthy diet. Foods like vegetables, fruits, whole grains, low-fat dairy products, and lean protein foods contain the nutrients you need without too many calories. Decrease your intake of foods high in solid fats, added sugars, and salt. Eat the right amount of calories for you. Get information about a proper diet from your health care provider, if necessary.  Regular physical exercise is one of the most important things you can do for your health. Most adults should get at least 150 minutes of moderate-intensity exercise (any activity that increases your heart rate and causes you to sweat) each week. In addition, most adults need muscle-strengthening exercises on 2 or more days a week.  Maintain a healthy weight. The body mass index (BMI) is a screening tool to identify possible weight problems. It provides an estimate of body fat based on height and weight. Your health care provider can find your BMI and can help you achieve or maintain a healthy weight. For adults 20 years and older:  A BMI below 18.5 is considered underweight.  A BMI of 18.5 to 24.9 is normal.  A BMI of 25 to 29.9 is considered overweight.  A BMI of 30 and above is considered obese.  Maintain normal blood lipids and cholesterol levels by exercising and minimizing your intake of saturated fat. Eat a balanced diet with plenty of fruit and vegetables. Blood tests for lipids and cholesterol should begin at age 65 and be repeated every 5  years. If your lipid or cholesterol levels are high, you are over 50, or you are at high risk for heart disease, you may need your cholesterol levels checked more frequently. Ongoing high lipid and cholesterol levels should be treated with medicines if diet and exercise are not working.  If you smoke, find out from your health care provider how to quit. If you do not use tobacco, do not start.  Lung cancer screening is recommended for adults aged 74-80 years who are at high risk for developing lung cancer because of a history of smoking. A yearly low-dose CT scan of the lungs is recommended for people who have at least a 30-pack-year history of smoking and are a current smoker or have quit within the past 15 years. A pack year of smoking is smoking an average of 1 pack of cigarettes a day  for 1 year (for example: 1 pack a day for 30 years or 2 packs a day for 15 years). Yearly screening should continue until the smoker has stopped smoking for at least 15 years. Yearly screening should be stopped for people who develop a health problem that would prevent them from having lung cancer treatment.  High blood pressure causes heart disease and increases the risk of stroke. Your blood pressure should be checked at least every 1 to 2 years. Ongoing high blood pressure should be treated with medicines if weight loss and exercise do not work.  If you are 1-46 years old, ask your health care provider if you should take aspirin to prevent strokes.  Diabetes screening involves taking a blood sample to check your fasting blood sugar level. This should be done once every 3 years, after age 66, if you are within normal weight and without risk factors for diabetes. Testing should be considered at a younger age or be carried out more frequently if you are overweight and have at least 1 risk factor for diabetes.  Breast cancer screening is essential preventive care for women. You should practice "breast self-awareness."  This means understanding the normal appearance and feel of your breasts and may include breast self-examination. Any changes detected, no matter how small, should be reported to a health care provider. Women in their 51s and 30s should have a clinical breast exam (CBE) by a health care provider as part of a regular health exam every 1 to 3 years. After age 3, women should have a CBE every year. Starting at age 40, women should consider having a mammogram (breast X-ray test) every year. Women who have a family history of breast cancer should talk to their health care provider about genetic screening. Women at a high risk of breast cancer should talk to their health care providers about having an MRI and a mammogram every year.  Breast cancer gene (BRCA)-related cancer risk assessment is recommended for women who have family members with BRCA-related cancers. BRCA-related cancers include breast, ovarian, tubal, and peritoneal cancers. Having family members with these cancers may be associated with an increased risk for harmful changes (mutations) in the breast cancer genes BRCA1 and BRCA2. Results of the assessment will determine the need for genetic counseling and BRCA1 and BRCA2 testing.  Routine pelvic exams to screen for cancer are no longer recommended for nonpregnant women who are considered low risk for cancer of the pelvic organs (ovaries, uterus, and vagina) and who do not have symptoms. Ask your health care provider if a screening pelvic exam is right for you.  If you have had past treatment for cervical cancer or a condition that could lead to cancer, you need Pap tests and screening for cancer for at least 20 years after your treatment. If Pap tests have been discontinued, your risk factors (such as having a new sexual partner) need to be reassessed to determine if screening should be resumed. Some women have medical problems that increase the chance of getting cervical cancer. In these cases, your  health care provider may recommend more frequent screening and Pap tests.  Colorectal cancer can be detected and often prevented. Most routine colorectal cancer screening begins at the age of 61 years and continues through age 26 years. However, your health care provider may recommend screening at an earlier age if you have risk factors for colon cancer. On a yearly basis, your health care provider may provide home test kits to check  for hidden blood in the stool. Use of a small camera at the end of a tube, to directly examine the colon (sigmoidoscopy or colonoscopy), can detect the earliest forms of colorectal cancer. Talk to your health care provider about this at age 50, when routine screening begins.  Direct exam of the colon should be repeated every 5-10 years through age 75 years, unless early forms of pre-cancerous polyps or small growths are found.  Hepatitis C blood testing is recommended for all people born from 1945 through 1965 and any individual with known risks for hepatitis C.  Pra  Osteoporosis is a disease in which the bones lose minerals and strength with aging. This can result in serious bone fractures or breaks. The risk of osteoporosis can be identified using a bone density scan. Women ages 65 years and over and women at risk for fractures or osteoporosis should discuss screening with their health care providers. Ask your health care provider whether you should take a calcium supplement or vitamin D to reduce the rate of osteoporosis.  Menopause can be associated with physical symptoms and risks. Hormone replacement therapy is available to decrease symptoms and risks. You should talk to your health care provider about whether hormone replacement therapy is right for you.  Use sunscreen. Apply sunscreen liberally and repeatedly throughout the day. You should seek shade when your shadow is shorter than you. Protect yourself by wearing long sleeves, pants, a wide-brimmed hat, and  sunglasses year round, whenever you are outdoors.  Once a month, do a whole body skin exam, using a mirror to look at the skin on your back. Tell your health care provider of new moles, moles that have irregular borders, moles that are larger than a pencil eraser, or moles that have changed in shape or color.  Stay current with required vaccines (immunizations).  Influenza vaccine. All adults should be immunized every year.  Tetanus, diphtheria, and acellular pertussis (Td, Tdap) vaccine. Pregnant women should receive 1 dose of Tdap vaccine during each pregnancy. The dose should be obtained regardless of the length of time since the last dose. Immunization is preferred during the 27th-36th week of gestation. An adult who has not previously received Tdap or who does not know her vaccine status should receive 1 dose of Tdap. This initial dose should be followed by tetanus and diphtheria toxoids (Td) booster doses every 10 years. Adults with an unknown or incomplete history of completing a 3-dose immunization series with Td-containing vaccines should begin or complete a primary immunization series including a Tdap dose. Adults should receive a Td booster every 10 years.  Varicella vaccine. An adult without evidence of immunity to varicella should receive 2 doses or a second dose if she has previously received 1 dose. Pregnant females who do not have evidence of immunity should receive the first dose after pregnancy. This first dose should be obtained before leaving the health care facility. The second dose should be obtained 4-8 weeks after the first dose.  Human papillomavirus (HPV) vaccine. Females aged 13-26 years who have not received the vaccine previously should obtain the 3-dose series. The vaccine is not recommended for use in pregnant females. However, pregnancy testing is not needed before receiving a dose. If a female is found to be pregnant after receiving a dose, no treatment is needed. In that  case, the remaining doses should be delayed until after the pregnancy. Immunization is recommended for any person with an immunocompromised condition through the age of 26 years   if she did not get any or all doses earlier. During the 3-dose series, the second dose should be obtained 4-8 weeks after the first dose. The third dose should be obtained 24 weeks after the first dose and 16 weeks after the second dose.  Zoster vaccine. One dose is recommended for adults aged 60 years or older unless certain conditions are present.  Measles, mumps, and rubella (MMR) vaccine. Adults born before 1957 generally are considered immune to measles and mumps. Adults born in 1957 or later should have 1 or more doses of MMR vaccine unless there is a contraindication to the vaccine or there is laboratory evidence of immunity to each of the three diseases. A routine second dose of MMR vaccine should be obtained at least 28 days after the first dose for students attending postsecondary schools, health care workers, or international travelers. People who received inactivated measles vaccine or an unknown type of measles vaccine during 1963-1967 should receive 2 doses of MMR vaccine. People who received inactivated mumps vaccine or an unknown type of mumps vaccine before 1979 and are at high risk for mumps infection should consider immunization with 2 doses of MMR vaccine. For females of childbearing age, rubella immunity should be determined. If there is no evidence of immunity, females who are not pregnant should be vaccinated. If there is no evidence of immunity, females who are pregnant should delay immunization until after pregnancy. Unvaccinated health care workers born before 1957 who lack laboratory evidence of measles, mumps, or rubella immunity or laboratory confirmation of disease should consider measles and mumps immunization with 2 doses of MMR vaccine or rubella immunization with 1 dose of MMR vaccine.  Pneumococcal  13-valent conjugate (PCV13) vaccine. When indicated, a person who is uncertain of her immunization history and has no record of immunization should receive the PCV13 vaccine. An adult aged 19 years or older who has certain medical conditions and has not been previously immunized should receive 1 dose of PCV13 vaccine. This PCV13 should be followed with a dose of pneumococcal polysaccharide (PPSV23) vaccine. The PPSV23 vaccine dose should be obtained at least 1 or more year(s) after the dose of PCV13 vaccine. An adult aged 19 years or older who has certain medical conditions and previously received 1 or more doses of PPSV23 vaccine should receive 1 dose of PCV13. The PCV13 vaccine dose should be obtained 1 or more years after the last PPSV23 vaccine dose.    Pneumococcal polysaccharide (PPSV23) vaccine. When PCV13 is also indicated, PCV13 should be obtained first. All adults aged 65 years and older should be immunized. An adult younger than age 65 years who has certain medical conditions should be immunized. Any person who resides in a nursing home or long-term care facility should be immunized. An adult smoker should be immunized. People with an immunocompromised condition and certain other conditions should receive both PCV13 and PPSV23 vaccines. People with human immunodeficiency virus (HIV) infection should be immunized as soon as possible after diagnosis. Immunization during chemotherapy or radiation therapy should be avoided. Routine use of PPSV23 vaccine is not recommended for American Indians, [place] Natives, or people younger than 65 years unless there are medical conditions that require PPSV23 vaccine. When indicated, people who have unknown immunization and have no record of immunization should receive PPSV23 vaccine. One-time revaccination 5 years after the first dose of PPSV23 is recommended for people aged 19-64 years who have chronic kidney failure, nephrotic syndrome, asplenia, or  immunocompromised conditions. People who received 1-2   doses of PPSV23 before age 65 years should receive another dose of PPSV23 vaccine at age 65 years or later if at least 5 years have passed since the previous dose. Doses of PPSV23 are not needed for people immunized with PPSV23 at or after age 65 years.  Preventive Services / Frequency   Ages 40 to 64 years  Blood pressure check.  Lipid and cholesterol check.  Lung cancer screening. / Every year if you are aged 55-80 years and have a 30-pack-year history of smoking and currently smoke or have quit within the past 15 years. Yearly screening is stopped once you have quit smoking for at least 15 years or develop a health problem that would prevent you from having lung cancer treatment.  Clinical breast exam.** / Every year after age 40 years.   BRCA-related cancer risk assessment.** / For women who have family members with a BRCA-related cancer (breast, ovarian, tubal, or peritoneal cancers).  Mammogram.** / Every year beginning at age 40 years and continuing for as long as you are in good health. Consult with your health care provider.  Pap test.** / Every 3 years starting at age 30 years through age 65 or 70 years with a history of 3 consecutive normal Pap tests.  HPV screening.** / Every 3 years from ages 30 years through ages 65 to 70 years with a history of 3 consecutive normal Pap tests.  Fecal occult blood test (FOBT) of stool. / Every year beginning at age 50 years and continuing until age 75 years. You may not need to do this test if you get a colonoscopy every 10 years.  Flexible sigmoidoscopy or colonoscopy.** / Every 5 years for a flexible sigmoidoscopy or every 10 years for a colonoscopy beginning at age 50 years and continuing until age 75 years.  Hepatitis C blood test.** / For all people born from 1945 through 1965 and any individual with known risks for hepatitis C.  Skin self-exam. / Monthly.  Influenza vaccine. /  Every year.  Tetanus, diphtheria, and acellular pertussis (Tdap/Td) vaccine.** / Consult your health care provider. Pregnant women should receive 1 dose of Tdap vaccine during each pregnancy. 1 dose of Td every 10 years.  Varicella vaccine.** / Consult your health care provider. Pregnant females who do not have evidence of immunity should receive the first dose after pregnancy.  Zoster vaccine.** / 1 dose for adults aged 60 years or older.  Pneumococcal 13-valent conjugate (PCV13) vaccine.** / Consult your health care provider.  Pneumococcal polysaccharide (PPSV23) vaccine.** / 1 to 2 doses if you smoke cigarettes or if you have certain conditions.  Meningococcal vaccine.** / Consult your health care provider.  Hepatitis A vaccine.** / Consult your health care provider.  Hepatitis B vaccine.** / Consult your health care provider. Screening for abdominal aortic aneurysm (AAA)  by ultrasound is recommended for people over 50 who have history of high blood pressure or who are current or former smokers. ++++++++++++++++++ Recommend Adult Low Dose Aspirin or  coated  Aspirin 81 mg daily  To reduce risk of Colon Cancer 40 %,  Skin Cancer 26 % ,  Melanoma 46%  and  Pancreatic cancer 60% +++++++++++++++++++ Vitamin D goal  is between 70-100.  Please make sure that you are taking your Vitamin D as directed.  It is very important as a natural anti-inflammatory  helping hair, skin, and nails, as well as reducing stroke and heart attack risk.  It helps your bones and helps with mood. It   also decreases numerous cancer risks so please take it as directed.  Low Vit D is associated with a 200-300% higher risk for CANCER  and 200-300% higher risk for HEART   ATTACK  &  STROKE.   .....................................Marland Kitchen It is also associated with higher death rate at younger ages,  autoimmune diseases like Rheumatoid arthritis, Lupus, Multiple Sclerosis.    Also many other serious conditions, like  depression, Alzheimer's Dementia, infertility, muscle aches, fatigue, fibromyalgia - just to name a few. ++++++++++++++++++ Recommend the book "The END of DIETING" by Dr Excell Seltzer  & the book "The END of DIABETES " by Dr Excell Seltzer At [email or web address] - get book & Audio CD's    Being diabetic has a  300% increased risk for heart attack, stroke, cancer, and alzheimer- type vascular dementia. It is very important that you work harder with diet by avoiding all foods that are white. Avoid white rice (brown & wild rice is OK), white potatoes (sweetpotatoes in moderation is OK), White bread or wheat bread or anything made out of white flour like bagels, donuts, rolls, buns, biscuits, cakes, pastries, cookies, pizza crust, and pasta (made from white flour & egg whites) - vegetarian pasta or spinach or wheat pasta is OK. Multigrain breads like Arnold's or [employer], or multigrain sandwich thins or flatbreads.  Diet, exercise and weight loss can reverse and cure diabetes in the early stages.  Diet, exercise and weight loss is very important in the control and prevention of complications of diabetes which affects every system in your body, ie. Brain - dementia/stroke, eyes - glaucoma/blindness, heart - heart attack/heart failure, kidneys - dialysis, stomach - gastric paralysis, intestines - malabsorption, nerves - severe painful neuritis, circulation - gangrene & loss of a leg(s), and finally cancer and Alzheimers.    I recommend avoid fried & greasy foods,  sweets/candy, white rice (brown or wild rice or Quinoa is OK), white potatoes (sweet potatoes are OK) - anything made from white flour - bagels, doughnuts, rolls, buns, biscuits,white and wheat breads, pizza crust and traditional pasta made of white flour & egg white(vegetarian pasta or spinach or wheat pasta is OK).  Multi-grain bread is OK - like multi-grain flat bread or sandwich thins. Avoid alcohol in excess. Exercise is also important.    Eat all the  vegetables you want - avoid meat, especially red meat and dairy - especially cheese.  Cheese is the most concentrated form of trans-fats which is the worst thing to clog up our arteries. Veggie cheese is OK which can be found in the fresh produce section at [employer] or [employer] or [employer]  ++++++++++++++++++++++ DASH Eating Plan  DASH stands for "Dietary Approaches to Stop Hypertension."   The DASH eating plan is a healthy eating plan that has been shown to reduce high blood pressure (hypertension). Additional health benefits may include reducing the risk of type 2 diabetes mellitus, heart disease, and stroke. The DASH eating plan may also help with weight loss. WHAT DO I NEED TO KNOW ABOUT THE DASH EATING PLAN? For the DASH eating plan, you will follow these general guidelines:  Choose foods with a percent daily value for sodium of less than 5% (as listed on the food label).  Use salt-free seasonings or herbs instead of table salt or sea salt.  Check with your health care provider or pharmacist before using salt substitutes.  Eat lower-sodium products, often labeled as "lower sodium" or "no salt added."  Eat fresh foods.  Eat  more vegetables, fruits, and low-fat dairy products.  Choose whole grains. Look for the word "whole" as the first word in the ingredient list.  Choose fish   Limit sweets, desserts, sugars, and sugary drinks.  Choose heart-healthy fats.  Eat veggie cheese   Eat more home-cooked food and less restaurant, buffet, and fast food.  Limit fried foods.  Cook foods using methods other than frying.  Limit canned vegetables. If you do use them, rinse them well to decrease the sodium.  When eating at a restaurant, ask that your food be prepared with less salt, or no salt if possible.                      WHAT FOODS CAN I EAT? Read Dr Fara Olden Fuhrman's books on The End of Dieting & The End of Diabetes  Grains Whole grain or whole wheat bread. Brown  rice. Whole grain or whole wheat pasta. Quinoa, bulgur, and whole grain cereals. Low-sodium cereals. Corn or whole wheat flour tortillas. Whole grain cornbread. Whole grain crackers. Low-sodium crackers.  Vegetables Fresh or frozen vegetables (raw, steamed, roasted, or grilled). Low-sodium or reduced-sodium tomato and vegetable juices. Low-sodium or reduced-sodium tomato sauce and paste. Low-sodium or reduced-sodium canned vegetables.   Fruits All fresh, canned (in natural juice), or frozen fruits.  Protein Products  All fish and seafood.  Dried beans, peas, or lentils. Unsalted nuts and seeds. Unsalted canned beans.  Dairy Low-fat dairy products, such as skim or 1% milk, 2% or reduced-fat cheeses, low-fat ricotta or cottage cheese, or plain low-fat yogurt. Low-sodium or reduced-sodium cheeses.  Fats and Oils Tub margarines without trans fats. Light or reduced-fat mayonnaise and salad dressings (reduced sodium). Avocado. Safflower, olive, or canola oils. Natural peanut or almond butter.  Other Unsalted popcorn and pretzels. The items listed above may not be a complete list of recommended foods or beverages. Contact your dietitian for more options.  ++++++++++++++++++  WHAT FOODS ARE NOT RECOMMENDED? Grains/ White flour or wheat flour White bread. White pasta. White rice. Refined cornbread. Bagels and croissants. Crackers that contain trans fat.  Vegetables  Creamed or fried vegetables. Vegetables in a . Regular canned vegetables. Regular canned tomato sauce and paste. Regular tomato and vegetable juices.  Fruits Dried fruits. Canned fruit in light or heavy syrup. Fruit juice.  Meat and Other Protein Products Meat in general - RED meat & White meat.  Fatty cuts of meat. Ribs, chicken wings, all processed meats as bacon, sausage, bologna, salami, fatback, hot dogs, bratwurst and packaged luncheon meats.  Dairy Whole or 2% milk, cream, half-and-half, and cream cheese. Whole-fat or  sweetened yogurt. Full-fat cheeses or blue cheese. Non-dairy creamers and whipped toppings. Processed cheese, cheese spreads, or cheese curds.  Condiments Onion and garlic salt, seasoned salt, table salt, and sea salt. Canned and packaged gravies. Worcestershire sauce. Tartar sauce. Barbecue sauce. Teriyaki sauce. Soy sauce, including reduced sodium. Steak sauce. Fish sauce. Oyster sauce. Cocktail sauce. Horseradish. Ketchup and mustard. Meat flavorings and tenderizers. Bouillon cubes. Hot sauce. Tabasco sauce. Marinades. Taco seasonings. Relishes.  Fats and Oils Butter, stick margarine, lard, shortening and bacon fat. Coconut, palm kernel, or palm oils. Regular salad dressings.  Pickles and olives. Salted popcorn and pretzels.  The items listed above may not be a complete list of foods and beverages to avoid.

## 2018-11-05 NOTE — Progress Notes (Signed)
[facility] Lucky CowboyWilliam Laneshia Pina, M.D.     [name], P.A.-C Judd GaudierAshley Corbett, DNP _______________________________________________ [facility] [address] [place], [place] [id number] Telephone [phone] Telefax [phone] Annual Screening/Preventative Visit & Comprehensive Evaluation &  Examination     This very nice 53 y.o. MWF  presents for a Screening /Preventative Visit & comprehensive evaluation and management of multiple medical co-morbidities.  Patient has been followed for HTN, HLD, T2_NIDDM  and Vitamin D Deficiency. She relates her husband had recent Covid-19 exposure from his para-legal 1 week ago (Herhusband) and patient''s husband has been tested with results pending. Both havebeen asymptomatic  for symptoms concerning for COVID-19 infection such as CP, fever, chills, cough, loss of smell or taste or new SHORTNESS OF BREATH that suggest any further testing/ screening at this time.  Social distancing reinforced today.      HTN predates since 2000. Patient's BP has been controlled at home and patient denies any cardiac symptoms as chest pain, palpitations, shortness of breath, dizziness or ankle swelling. Today's BP was initially slightly elevated & rechecked at goal - 138/84.      Patient's hyperlipidemia is controlled with diet and medications. Patient denies myalgias or other medication SE's. Last lipids were  Lab Results  Component Value Date   CHOL 202 (H) 05/10/2018   HDL 32 (L) 05/10/2018   LDLCALC not calculated 05/10/2018   TRIG 465 (H) 05/10/2018   CHOLHDL 6.3 (H) 05/10/2018      Patient has hx/o PreDM / Insulin Resistance since 2015 til dx'd T2_NIDDM with CKD2   in Feb 2019 with A1c 6.5% . Then A1c 6.6% / June 2019 and 7.1% / Sept 2019 and patient is failing her attempts to control by diet.  She never started Rx of Metformin that she was given. Patient denies reactive hypoglycemic symptoms,  visual blurring, diabetic polys or paresthesias. Last A1c was not at goal: Lab Results  Component Value Date   HGBA1C 7.2 (H) 05/10/2018        Patient was initiated on  thyroid  replacement  since dx'd Hypothyroid in 1997.     Finally, patient has history of Vitamin D Deficiency ("20" / 2013 &"16" / 2014) and last Vitamin D was still very low: Lab Results  Component Value Date   VD25OH 18 (L) 02/04/2018   Current Outpatient Medications on File Prior to Visit  Medication Sig  . atenolol (TENORMIN) 100 MG tablet TAKE 1 TABLET BY MOUTH  DAILY  . fenofibrate micronized (LOFIBRA) 134 MG capsule Take 1 capsule Daily for Triglycerides  /  Blood Fats  . hydrochlorothiazide (HYDRODIURIL) 25 MG tablet TAKE 1 TABLET BY MOUTH EVERY DAY  . levothyroxine (SYNTHROID) 112 MCG tablet Take 1 tablet daily on an empty stomach with only water for 30 minutes & no Antacid meds, Calcium or Magnesium for 4 hours & avoid Biotin   No current facility-administered medications on file prior to visit.    Allergies  Allergen Reactions  . Codeine   . Ppd [Tuberculin Purified Protein Derivative]     Positive reaction 2001  . Lisinopril     Dry cough   Past Medical History:  Diagnosis Date  . Hypertension   . Hypothyroidism    Health Maintenance  Topic Date Due  . OPHTHALMOLOGY EXAM  01/21/1976  . HIV Screening  01/20/1981  . COLONOSCOPY  01/21/2016  . URINE MICROALBUMIN  05/30/2017  . MAMMOGRAM  04/17/2018  . PNEUMOCOCCAL POLYSACCHARIDE VACCINE  AGE 53-64 HIGH RISK  11/05/2019 (Originally 01/21/1968)  . HEMOGLOBIN A1C  11/09/2018  . INFLUENZA VACCINE  12/14/2018  . PAP SMEAR-Modifier  03/28/2019  . FOOT EXAM  11/05/2019  . TETANUS/TDAP  08/01/2022   Immunization History  Administered Date(s) Administered  . Td 06/29/2001, 07/31/2012  . Tdap 07/31/2012   Last Colon - never - is agreeable to do [place] if her Insurance will pay.   Last MGM - gets at [place] office Elon Alas, NP)  Past Surgical  History:  Procedure Laterality Date  . LYMPH NODE DISSECTION     NECK- BENIGN   Family History  Problem Relation Age of Onset  . Cancer Mother        NON HODGKINS LYMPHOMA  . Hypertension Mother   . Diabetes Mother   . Hyperlipidemia Mother   . Hypertension Father   . Heart disease Father    Social History   Tobacco Use  . Smoking status: Never Smoker  . Smokeless tobacco: Never Used  Substance Use Topics  . Alcohol use: Yes    Comment: occ.  . Drug use: No    ROS Constitutional: Denies fever, chills, weight loss/gain, headaches, insomnia,  night sweats, and change in appetite. Does c/o fatigue. Eyes: Denies redness, blurred vision, diplopia, discharge, itchy, watery eyes.  ENT: Denies discharge, congestion, post nasal drip, epistaxis, sore throat, earache, hearing loss, dental pain, Tinnitus, Vertigo, Sinus pain, snoring.  Cardio: Denies chest pain, palpitations, irregular heartbeat, syncope, dyspnea, diaphoresis, orthopnea, PND, claudication, edema Respiratory: denies cough, dyspnea, DOE, pleurisy, hoarseness, laryngitis, wheezing.  Gastrointestinal: Denies dysphagia, heartburn, reflux, water brash, pain, cramps, nausea, vomiting, bloating, diarrhea, constipation, hematemesis, melena, hematochezia, jaundice, hemorrhoids Genitourinary: Denies dysuria, frequency, urgency, nocturia, hesitancy, discharge, hematuria, flank pain Breast: Breast lumps, nipple discharge, bleeding.  Musculoskeletal: Denies arthralgia, myalgia, stiffness, Jt. Swelling, pain, limp, and strain/sprain. Denies falls. Skin: Denies puritis, rash, hives, warts, acne, eczema, changing in skin lesion Neuro: No weakness, tremor, incoordination, spasms, paresthesia, pain Psychiatric: Denies confusion, memory loss, sensory loss. Denies Depression. Endocrine: Denies change in weight, skin, hair change, nocturia, and paresthesia, diabetic polys, visual blurring, hyper / hypo glycemic episodes.  Heme/Lymph: No  excessive bleeding, bruising, enlarged lymph nodes.  Physical Exam  BP 138/84   Pulse 68   Temp (!) 97.2 F (36.2 C)   Resp 16   Ht 5\' 2"  (1.575 m)   Wt 162 lb (73.5 kg)   BMI 29.63 kg/m   General Appearance: Over nourished with Central Obesity and in no apparent distress.  Eyes: PERRLA, EOMs, conjunctiva no swelling or erythema, normal fundi and vessels. Sinuses: No frontal/maxillary tenderness ENT/Mouth: EACs patent / TMs  nl. Nares clear without erythema, swelling, mucoid exudates. Oral hygiene is good. No erythema, swelling, or exudate. Tongue normal, non-obstructing. Tonsils not swollen or erythematous. Hearing normal.  Neck: Supple, thyroid not palpable. No bruits, nodes or JVD. Respiratory: Respiratory effort normal.  BS equal and clear bilateral without rales, rhonci, wheezing or stridor. Cardio: Heart sounds are normal with regular rate and rhythm and no murmurs, rubs or gallops. Peripheral pulses are normal and equal bilaterally without edema. No aortic or femoral bruits. Chest: symmetric with normal excursions and percussion. Breasts: Symmetric, without lumps, nipple discharge, retractions, or fibrocystic changes.  Abdomen: Flat, soft with bowel sounds active. Nontender, no guarding, rebound, hernias, masses, or organomegaly.  Lymphatics: Non tender without lymphadenopathy.  Musculoskeletal: Full ROM all peripheral extremities, joint stability, 5/5 strength, and normal gait. Skin: Warm and dry without rashes,  lesions, cyanosis, clubbing or  ecchymosis.  Neuro: Cranial nerves intact, reflexes equal bilaterally. Normal muscle tone, no cerebellar symptoms. Sensation intact to touch, vibratory and Monofilament to the toes bilaterally. Pysch: Alert and oriented X 3, normal affect, Insight and Judgment appropriate.   Assessment and Plan  1. Annual Preventative Screening Examination  2. Essential hypertension  - EKG 12-Lead - US, RETROPERITNL ABD,  LTD - Urinalysis, Routine  w reflex microscopic - Microalbumin / creatinine urine ratio - CBC with Differential/Platelet - COMPLETE METABOLIC PANEL WITH GFR - Magnesium - TSH  3. Hyperlipidemia, mixed  - EKG 12-Lead - US, RETROPERITNL ABD,  LTD - Lipid panel - TSH  4. Type 2 diabetes mellitus with stage 2 chronic kidney disease, without long-term current use of insulin (HCC)  - Urinalysis, Routine w reflex microscopic - Microalbumin / creatinine urine ratio - HM DIABETES FOOT EXAM - LOW EXTREMITY NEUR EXAM DOCUM - Hemoglobin A1c - Insulin, random  5. Vitamin D deficiency  - VITAMIN D 25 Hydroxyl  6. Hypothyroidism  - TSH  7. Screening-pulmonary TB  8. Screening for colorectal cancer  9. Screening for ischemic heart disease  - EKG 12-Lead  10. FHx: heart disease  - EKG 12-Lead - US, RETROPERITNL ABD,  LTD  11. Screening for AAA (aortic abdominal aneurysm)  - US, RETROPERITNL ABD,  LTD  12. Fatigue, unspecified type  - US, RETROPERITNL ABD,  LTD - Iron,Total/Total Iron Binding Cap - Vitamin B12  13. Medication management  - Urinalysis, Routine w reflex microscopic - Microalbumin / creatinine urine ratio - CBC with Differential/Platelet - COMPLETE METABOLIC PANEL WITH GFR - Magnesium - Lipid panel - TSH - Hemoglobin A1c - Insulin, random - VITAMIN D 25 Hydroxy (Vit-D Deficiency, Fractures)  14. Close Exposure to Covid-19 Virus  - SAR CoV2 Serology (COVID 19)AB(IGG)IA          Patient was counseled in prudent diet to achieve /maintain BMI less than 25 for weight control, BP monitoring, regular exercise and medications. Discussed med's effects and SE's. Screening labs and tests as requested with regular follow-up as recommended. Over 40 minutes of exam, counseling, chart review and high complex critical decision making was performed.   Marinus MawWilliam D Trichelle Lehan, MD

## 2018-11-06 ENCOUNTER — Other Ambulatory Visit: Payer: Self-pay | Admitting: Internal Medicine

## 2018-11-06 DIAGNOSIS — Z20828 Contact with and (suspected) exposure to other viral communicable diseases: Secondary | ICD-10-CM | POA: Diagnosis not present

## 2018-11-06 DIAGNOSIS — E1122 Type 2 diabetes mellitus with diabetic chronic kidney disease: Secondary | ICD-10-CM

## 2018-11-06 DIAGNOSIS — N182 Chronic kidney disease, stage 2 (mild): Secondary | ICD-10-CM

## 2018-11-06 LAB — CBC WITH DIFFERENTIAL/PLATELET
Absolute Monocytes: 429 cells/uL (ref 200–950)
Basophils Absolute: 33 cells/uL (ref 0–200)
Basophils Relative: 0.5 %
Eosinophils Absolute: 117 cells/uL (ref 15–500)
Eosinophils Relative: 1.8 %
HCT: 45.3 % — ABNORMAL HIGH (ref 35.0–45.0)
Hemoglobin: 15.2 g/dL (ref 11.7–15.5)
Lymphs Abs: 1950 cells/uL (ref 850–3900)
MCH: 29.5 pg (ref 27.0–33.0)
MCHC: 33.6 g/dL (ref 32.0–36.0)
MCV: 87.8 fL (ref 80.0–100.0)
MPV: 10.4 fL (ref 7.5–12.5)
Monocytes Relative: 6.6 %
Neutro Abs: 3972 cells/uL (ref 1500–7800)
Neutrophils Relative %: 61.1 %
Platelets: 257 10*3/uL (ref 140–400)
RBC: 5.16 10*6/uL — ABNORMAL HIGH (ref 3.80–5.10)
RDW: 13.1 % (ref 11.0–15.0)
Total Lymphocyte: 30 %
WBC: 6.5 10*3/uL (ref 3.8–10.8)

## 2018-11-06 LAB — URINALYSIS, ROUTINE W REFLEX MICROSCOPIC
Bacteria, UA: NONE SEEN /HPF
Bilirubin Urine: NEGATIVE
Hgb urine dipstick: NEGATIVE
Hyaline Cast: NONE SEEN /LPF
Ketones, ur: NEGATIVE
Nitrite: NEGATIVE
Protein, ur: NEGATIVE
RBC / HPF: NONE SEEN /HPF (ref 0–2)
Specific Gravity, Urine: 1.005 (ref 1.001–1.03)
pH: 6.5 (ref 5.0–8.0)

## 2018-11-06 LAB — COMPLETE METABOLIC PANEL WITH GFR
AG Ratio: 1.5 (calc) (ref 1.0–2.5)
ALT: 51 U/L — ABNORMAL HIGH (ref 6–29)
AST: 37 U/L — ABNORMAL HIGH (ref 10–35)
Albumin: 4.7 g/dL (ref 3.6–5.1)
Alkaline phosphatase (APISO): 69 U/L (ref 37–153)
BUN: 13 mg/dL (ref 7–25)
CO2: 27 mmol/L (ref 20–32)
Calcium: 10 mg/dL (ref 8.6–10.4)
Chloride: 94 mmol/L — ABNORMAL LOW (ref 98–110)
Creat: 0.75 mg/dL (ref 0.50–1.05)
GFR, Est African American: 106 mL/min/{1.73_m2} (ref >=60)
GFR, Est Non African American: 92 mL/min/{1.73_m2} (ref >=60)
Globulin: 3.2 g/dL (calc) (ref 1.9–3.7)
Glucose, Bld: 308 mg/dL — ABNORMAL HIGH (ref 65–99)
Potassium: 3.8 mmol/L (ref 3.5–5.3)
Sodium: 133 mmol/L — ABNORMAL LOW (ref 135–146)
Total Bilirubin: 0.6 mg/dL (ref 0.2–1.2)
Total Protein: 7.9 g/dL (ref 6.1–8.1)

## 2018-11-06 LAB — INSULIN, RANDOM: Insulin: 10.1 u[IU]/mL

## 2018-11-06 LAB — LIPID PANEL
Cholesterol: 225 mg/dL — ABNORMAL HIGH (ref <200)
HDL: 29 mg/dL — ABNORMAL LOW (ref >=50)
Non-HDL Cholesterol (Calc): 196 mg/dL (calc) — ABNORMAL HIGH (ref <130)
Total CHOL/HDL Ratio: 7.8 (calc) — ABNORMAL HIGH (ref <5.0)
Triglycerides: 805 mg/dL — ABNORMAL HIGH (ref <150)

## 2018-11-06 LAB — MICROALBUMIN / CREATININE URINE RATIO
Creatinine, Urine: 14 mg/dL — ABNORMAL LOW (ref 20–275)
Microalb Creat Ratio: 21 mcg/mg creat (ref <30)
Microalb, Ur: 0.3 mg/dL

## 2018-11-06 LAB — IRON, TOTAL/TOTAL IRON BINDING CAP
%SAT: 24 % (calc) (ref 16–45)
Iron: 85 ug/dL (ref 45–160)
TIBC: 347 mcg/dL (calc) (ref 250–450)

## 2018-11-06 LAB — HEMOGLOBIN A1C
Hgb A1c MFr Bld: 10.4 % of total Hgb — ABNORMAL HIGH (ref <5.7)
Mean Plasma Glucose: 252 (calc)
eAG (mmol/L): 13.9 (calc)

## 2018-11-06 LAB — VITAMIN D 25 HYDROXY (VIT D DEFICIENCY, FRACTURES): Vit D, 25-Hydroxy: 23 ng/mL — ABNORMAL LOW (ref 30–100)

## 2018-11-06 LAB — VITAMIN B12: Vitamin B-12: 943 pg/mL (ref 200–1100)

## 2018-11-06 LAB — TSH: TSH: 5.49 mIU/L — ABNORMAL HIGH

## 2018-11-06 LAB — MAGNESIUM: Magnesium: 2 mg/dL (ref 1.5–2.5)

## 2018-11-06 LAB — SAR COV2 SEROLOGY (COVID19)AB(IGG),IA: SARS CoV2 AB IGG: NEGATIVE

## 2018-11-06 MED ORDER — METFORMIN HCL ER 500 MG PO TB24: ORAL_TABLET | ORAL | 1 refills | Status: DC

## 2018-11-07 ENCOUNTER — Telehealth: Payer: Self-pay | Admitting: *Deleted

## 2018-11-07 NOTE — Telephone Encounter (Signed)
A message ws left to inform the patient that her blood sugar being 300, can also effect her triglycerides.  Per Dr Melford Aase, starting the Metformin for her blood sugar, can also help reduce her triglycerides,along with continuing her Fenofibrate and diet changes.

## 2018-11-12 NOTE — Progress Notes (Signed)
Diabetes Education and Follow-Up Visit  53 y.o.female presents for diabetic education. She has Diabetes Mellitus type 2:  with diabetic chronic kidney disease, she is on bASA, and denies increased appetite, nausea, paresthesia of the feet, polydipsia, polyuria, visual disturbances, vomiting and weight loss.  She was well controlled by lifestyle modification only until recently but jumped to A1C of 10.4 at last visit and was newly initiated on metfomin  Last hemoglobin A1c was: Lab Results  Component Value Date   HGBA1C 10.4 (H) 11/05/2018   HGBA1C 7.2 (H) 05/10/2018   HGBA1C 7.1 (H) 02/04/2018    BMI is Body mass index is 29.81 kg/m., she has not been working on diet and exercise, does have treadmill but not using at this time, plans to start.  Wt Readings from Last 3 Encounters:  11/13/18 163 lb (73.9 kg)  11/05/18 162 lb (73.5 kg)  05/10/18 166 lb 6.4 oz (75.5 kg)    Pt is on a regimen of: Glucophage XR - currently 2 tabs daily, working on increasing to 4 tabs  Glucometer: doesn't have glucometer   Exercise: not much currently, has treadmill  Diet  Breakfast: packet of oatmeal - plain - with tsp of butter, 1/2 tsp of sugar (has been skipping more recently0 Lunch: Sandwich - white bread with mayo, lunch meat and cheese Snack: potato chips occasionally, fresh fruit, cookies Dinner: red meat, spaghetti, tacos,   Drinks: sweet tea 2-3 glasses daily (with sugar), water  Patient does not have CKD She is not on ACE/ARB   Lab Results  Component Value Date   GFRNONAA 92 11/05/2018    Lab Results  Component Value Date   CREATININE 0.75 11/05/2018   BUN 13 11/05/2018   NA 133 (L) 11/05/2018   K 3.8 11/05/2018   CL 94 (L) 11/05/2018   CO2 27 11/05/2018    Lab Results  Component Value Date   MICROALBUR 0.3 11/05/2018     She is not on a Statin. She is on fenofibrate 145 mg daily for elevated triglycerides  She is not at goal of less than 70.  Lab Results   Component Value Date   CHOL 225 (H) 11/05/2018   HDL 29 (L) 11/05/2018   [place]  11/05/2018     Comment:     . LDL cholesterol not calculated. Triglyceride levels greater than 400 mg/dL invalidate calculated LDL results. . Reference range: <100 . Desirable range <100 mg/dL for primary prevention;   <70 mg/dL for patients with CHD or diabetic patients  with > or = 2 CHD risk factors. Marland Kitchen LDL-C is now calculated using the Martin-Hopkins  calculation, which is a validated novel method providing  better accuracy than the Friedewald equation in the  estimation of LDL-C.  Cresenciano Genre et al. Annamaria Helling. 6195;093(26): 2061-2068  (http://education.[employer].com/faq/FAQ164)    TRIG 805 (H) 11/05/2018   CHOLHDL 7.8 (H) 11/05/2018     Problem List has Essential hypertension; Hypothyroidism; Hyperlipidemia associated with type 2 diabetes mellitus ([place]); Irregular periods/menstrual cycles; Vitamin D deficiency; Medication management; Obesity (BMI 30.0-34.9); FHx: heart disease; and Type 2 diabetes mellitus with stage 2 chronic kidney disease, without long-term current use of insulin (HCC) on their problem list.  Medications Current Outpatient Medications on File Prior to Visit  Medication Sig  . aspirin EC 81 MG tablet Take 81 mg by mouth daily.  Marland Kitchen atenolol (TENORMIN) 100 MG tablet TAKE 1 TABLET BY MOUTH  DAILY  . fenofibrate micronized (LOFIBRA) 134 MG capsule Take 1 capsule Daily  for Triglycerides  /  Blood Fats  . hydrochlorothiazide (HYDRODIURIL) 25 MG tablet TAKE 1 TABLET BY MOUTH EVERY DAY  . levothyroxine (SYNTHROID) 112 MCG tablet Take 1 tablet daily on an empty stomach with only water for 30 minutes & no Antacid meds, Calcium or Magnesium for 4 hours & avoid Biotin  . metFORMIN (GLUCOPHAGE-XR) 500 MG 24 hr tablet Take 2 tablets 2 x/ day with a meal for Diabetes (Patient taking differently: Takes one tablet in the morning and one tablet in the evening)   No current  facility-administered medications on file prior to visit.     ROS- see HPI  Physical Exam: Blood pressure (!) 152/90, pulse 69, temperature 97.7 F (36.5 C), height 5\' 2"  (1.575 m), weight 163 lb (73.9 kg), SpO2 98 %. Body mass index is 29.81 kg/m. General Appearance: Well nourished, in no apparent distress. Eyes: PERRLA, EOMs, conjunctiva no swelling or erythema ENT/Mouth: Ext aud canals clear, TMs without erythema, bulging. No erythema, swelling, or exudate on post pharynx.  Tonsils not swollen or erythematous. Hearing normal.  Respiratory: Respiratory effort normal, BS equal bilaterally without rales, rhonchi, wheezing or stridor.  Cardio: RRR with no MRGs. Brisk peripheral pulses without edema.  Abdomen: Soft, + BS.  Non tender, no guarding, rebound, hernias, masses. Musculoskeletal: Full ROM, 5/5 strength, normal gait.  Skin: Warm, dry without rashes, lesions, ecchymosis.  Neuro: Cranial nerves intact. Normal muscle tone, no cerebellar symptoms. Sensation intact.    Plan and Assessment: Diabetes New diabetic; patient quite resistant to medication, low pill burden is priority, motivated to improve lifestyle and reverse with goal to get off medications Limited health literacy Re: diabetes  Education: Reviewed 'ABCs' of diabetes management (respective goals in parentheses):  A1C (<7), blood pressure (<130/80), and cholesterol (LDL <70)   Continue titration up with metformin to 4 tabs daily - take with meals to limit SE  Discussed transition from HCTZ to losartan/hctz - she would like to research, information provided, discuss at follow up  Discussed statin; will consider but declines for now; will continue fenofibrate and lifestyle for trigs and reevaluate at next OV; may consider switch from fenofibrate to statin if possible  Eye Exam yearly and Dental Exam every 6 months.- advised to schedule annual diabetes eye exam  Dietary recommendations reviewed at length  Physical  Activity recommendations given; aim for 150 min weekly  Glucometer will be ordered; information provided; schedule NV to review how to use if needed - check daily fasting  Given sugar log and advised how to fill it and to bring it at next appt - goal fasting <130     Future Appointments  Date Time Provider Department Center  12/17/2018  4:30 PM Judd Gaudierorbett, Camila Maita, NP [facility] None  02/06/2019  8:45 AM Judd Gaudierorbett, Lyle Leisner, NP [facility] None  05/12/2019  9:30 AM Lucky CowboyMcKeown, William, MD [facility] None  11/10/2019  9:00 AM Lucky CowboyMcKeown, William, MD [facility] None

## 2018-11-13 ENCOUNTER — Encounter: Payer: Self-pay | Admitting: Adult Health

## 2018-11-13 ENCOUNTER — Other Ambulatory Visit: Payer: Self-pay

## 2018-11-13 ENCOUNTER — Ambulatory Visit (INDEPENDENT_AMBULATORY_CARE_PROVIDER_SITE_OTHER): Payer: BC Managed Care – PPO | Admitting: Adult Health

## 2018-11-13 VITALS — BP 152/90 | HR 69 | Temp 97.7°F | Ht 62.0 in | Wt 163.0 lb

## 2018-11-13 DIAGNOSIS — E1165 Type 2 diabetes mellitus with hyperglycemia: Secondary | ICD-10-CM

## 2018-11-13 DIAGNOSIS — E1122 Type 2 diabetes mellitus with diabetic chronic kidney disease: Secondary | ICD-10-CM | POA: Diagnosis not present

## 2018-11-13 DIAGNOSIS — E669 Obesity, unspecified: Secondary | ICD-10-CM | POA: Diagnosis not present

## 2018-11-13 DIAGNOSIS — I10 Essential (primary) hypertension: Secondary | ICD-10-CM

## 2018-11-13 DIAGNOSIS — E785 Hyperlipidemia, unspecified: Secondary | ICD-10-CM

## 2018-11-13 DIAGNOSIS — E1169 Type 2 diabetes mellitus with other specified complication: Secondary | ICD-10-CM

## 2018-11-13 DIAGNOSIS — N182 Chronic kidney disease, stage 2 (mild): Secondary | ICD-10-CM

## 2018-11-13 DIAGNOSIS — E66811 Obesity, class 1: Secondary | ICD-10-CM

## 2018-11-13 MED ORDER — BLOOD GLUCOSE MONITORING SUPPL DEVI: 1.0000 | Freq: Every day | 0 refills | Status: AC

## 2018-11-13 MED ORDER — BLOOD GLUCOSE TEST VI STRP: ORAL_STRIP | 11 refills | Status: DC

## 2018-11-13 MED ORDER — LANCETS MISC: 11 refills | Status: DC

## 2018-11-13 NOTE — Patient Instructions (Addendum)
Goals    . Blood Pressure < 130/80    . Exercise 150 min/wk Moderate Activity    . Fasting Blood Glucose <130    . HEMOGLOBIN A1C < 7    . LDL CALC < 70    . Weight (lb) < 145 lb (65.8 kg)      You will need to get an annual diabetic eye exam - please schedule this with your vision provider or we can refer to an ophthalmologist  Try switching to Dave's killer bread - try the thin sliced variety (or any bread that is whole grain, high fiber)    Unlimited: lean proteins (nuts, seeds, chicken, [place], fish, meats, eggs), non-starchy vegetables (except potatos, carrots, corn, etc) - unlimited greens, cruciferous, peppers, etc  Moderate amount: fresh fruit, beans, sweet potatoes, whole grains  Minimal: starches, sugar, flour products   Try sugar alternatives - stevia or Monia Pouch is the best for blood sugar  Continue to slowly increase metformin up to taking 4 tabs daily - take WITH a meal and increase dose ever 2 weeks   Weight loss and exercise help insulin sensitivity and blood sugars   Drink 1/2 your body weight in fluid ounces of water daily; drink a tall glass of water 30 min before meals. Avoid drinking your calories  Don't eat until you're stuffed- listen to your stomach and eat until you are 80% full   Try eating off of a salad plate; wait 10 min after finishing before going back for seconds  Start by eating the vegetables on your plate; aim for 50% of your meals to be fruits or vegetables  Then eat your protein - lean meats (grass fed if possible), fish, beans, nuts in moderation  Eat your carbs/starch last ONLY if you still are hungry. If you can, stop before finishing it all  Avoid sugar and flour - the closer it looks to it's original form in nature, typically the better it is for you  Splurge in moderation - "assign" days when you get to splurge and have the "bad stuff" - I like to follow a 80% - 20% plan- "good" choices 80 % of the time, "bad" choices in moderation  20% of the time  Simple equation is: Calories out > calories in = weight loss - even if you eat the bad stuff, if you limit portions, you will still lose weight    Current guidelines recommend that all diabetic patients be on metformin, aspirin an ACE inihibitor or ARB type blood pressure medication and statin type cholesterol medication. Research shows improved outcomes and less complications (such as heart attack, stroke, dementia, kidney failure) with these medications as part of your care plan.   Suggest we switch from plain hydrochlorothiazide to combo pill with losartan to help improve blood pressure as well as protect kidneys.    Hydrochlorothiazide, HCTZ; Losartan tablets What is this medicine? LOSARTAN; HYDROCHLOROTHIAZIDE (loe SAR tan; hye droe klor oh THYE a zide) is a combination of a drug that relaxes blood vessels and a diuretic. It is used to treat high blood pressure. This medicine may also reduce the risk of stroke in certain patients. This medicine may be used for other purposes; ask your health care provider or pharmacist if you have questions. COMMON BRAND NAME(S): Hyzaar What should I tell my health care provider before I take this medicine? They need to know if you have any of these conditions:  decreased urine  diabetes  kidney disease  liver disease  if you are on a special diet, like a low-salt diet  immune system problems, like lupus  an unusual or allergic reaction to losartan, hydrochlorothiazide, sulfa drugs, other medicines, foods, dyes, or preservatives  pregnant or trying to get pregnant  breast-feeding How should I use this medicine? Take this medicine by mouth with a glass of water. Follow the directions on the prescription label. You can take it with or without food. If it upsets your stomach, take it with food. Take your medicine at regular intervals. Do not take it more often than directed. Do not stop taking except on your doctor's  advice. Talk to your pediatrician regarding the use of this medicine in children. Special care may be needed. Overdosage: If you think you have taken too much of this medicine contact a poison control center or emergency room at once. NOTE: This medicine is only for you. Do not share this medicine with others. What if I miss a dose? If you miss a dose, take it as soon as you can. If it is almost time for your next dose, take only that dose. Do not take double or extra doses. What may interact with this medicine?  barbiturates, like phenobarbital  blood pressure medicines  celecoxib  cimetidine  corticosteroids  diabetic medicines  diuretics, especially triamterene, spironolactone or amiloride  fluconazole  lithium  NSAIDs, medicines for pain and inflammation, like ibuprofen or naproxen  potassium salts or potassium supplements  prescription pain medicines  rifampin  skeletal muscle relaxants like tubocurarine  some cholesterol-lowering medicines like cholestyramine or colestipol This list may not describe all possible interactions. Give your health care provider a list of all the medicines, herbs, non-prescription drugs, or dietary supplements you use. Also tell them if you smoke, drink alcohol, or use illegal drugs. Some items may interact with your medicine. What should I watch for while using this medicine? Check your blood pressure regularly while you are taking this medicine. Ask your doctor or health care professional what your blood pressure should be and when you should contact him or her. When you check your blood pressure, write down the measurements to show your doctor or health care professional. If you are taking this medicine for a long time, you must visit your health care professional for regular checks on your progress. Make sure you schedule appointments on a regular basis. You must not get dehydrated. Ask your doctor or health care professional how much  fluid you need to drink a day. Check with him or her if you get an attack of severe diarrhea, nausea and vomiting, or if you sweat a lot. The loss of too much body fluid can make it dangerous for you to take this medicine. Women should inform their doctor if they wish to become pregnant or think they might be pregnant. There is a potential for serious side effects to an unborn child, particularly in the second or third trimester. Talk to your health care professional or pharmacist for more information. You may get drowsy or dizzy. Do not drive, use machinery, or do anything that needs mental alertness until you know how this drug affects you. Do not stand or sit up quickly, especially if you are an older patient. This reduces the risk of dizzy or fainting spells. Alcohol can make you more drowsy and dizzy. Avoid alcoholic drinks. This medicine may increase blood sugar. Ask your healthcare provider if changes in diet or medicines are needed if you have diabetes. Avoid salt substitutes unless  you are told otherwise by your doctor or health care professional. Do not treat yourself for coughs, colds, or pain while you are taking this medicine without asking your doctor or health care professional for advice. Some ingredients may increase your blood pressure. What side effects may I notice from receiving this medicine? Side effects that you should report to your doctor or health care professional as soon as possible:  allergic reactions like skin rash, itching or hives, swelling of the face, lips, or tongue  breathing problems  changes in vision  dark urine  eye pain  fast or irregular heart beat, palpitations, or chest pain  feeling faint or lightheaded  muscle cramps  persistent dry cough  redness, blistering, peeling or loosening of the skin, including inside the mouth   signs and symptoms of high blood sugar such as being more thirsty or hungry or having to urinate more than normal. You  may also feel very tired or have blurry vision.  stomach pain  trouble passing urine  unusual bleeding or bruising  worsened gout pain  yellowing of the eyes or skin Side effects that usually do not require medical attention (report to your doctor or health care professional if they continue or are bothersome):  change in sex drive or performance  headache This list may not describe all possible side effects. Call your doctor for medical advice about side effects. You may report side effects to FDA at 1-800-FDA-1088. Where should I keep my medicine? Keep out of the reach of children. Store at room temperature between 15 and 30 degrees C (59 and 86 degrees F). Protect from light. Keep container tightly closed. Throw away any unused medicine after the expiration date. NOTE: This sheet is a summary. It may not cover all possible information. If you have questions about this medicine, talk to your doctor, pharmacist, or health care provider.  2020 Elsevier/Gold Standard (2018-02-20 09:42:12)       Bad carbs also include fruit juice, alcohol, and sweet tea. These are empty calories that do not signal to your brain that you are full.   Please remember the good carbs are still carbs which convert into sugar. So please measure them out no more than 1/2-1 cup of rice, oatmeal, pasta, and beans  Veggies are however free foods! Pile them on.   Not all fruit is created equal. Please see the list below, the fruit at the bottom is higher in sugars than the fruit at the top. Please avoid all dried fruits.      Blood Glucose Monitoring, Adult Monitoring your blood sugar (glucose) is an important part of managing your diabetes (diabetes mellitus). Blood glucose monitoring involves checking your blood glucose as often as directed and keeping a record (log) of your results over time. Checking your blood glucose regularly and keeping a blood glucose log can:  Help you and your health care  provider adjust your diabetes management plan as needed, including your medicines or insulin.  Help you understand how food, exercise, illnesses, and medicines affect your blood glucose.  Let you know what your blood glucose is at any time. You can quickly find out if you have low blood glucose (hypoglycemia) or high blood glucose (hyperglycemia). Your health care provider will set individualized treatment goals for you. Your goals will be based on your age, other medical conditions you have, and how you respond to diabetes treatment. Generally, the goal of treatment is to maintain the following blood glucose levels:  Before  meals (preprandial): 80-130 mg/dL (1.6-1.04.4-7.2 mmol/L).  After meals (postprandial): below 180 mg/dL (10 mmol/L).  A1c level: less than 7%. Supplies needed:  Blood glucose meter.  Test strips for your meter. Each meter has its own strips. You must use the strips that came with your meter.  A needle to prick your finger (lancet). Do not use a lancet more than one time.  A device that holds the lancet (lancing device).  A journal or log book to write down your results. How to check your blood glucose  1. Wash your hands with soap and water. 2. Prick the side of your finger (not the tip) with the lancet. Use a different finger each time. 3. Gently rub the finger until a small drop of blood appears. 4. Follow instructions that come with your meter for inserting the test strip, applying blood to the strip, and using your blood glucose meter. 5. Write down your result and any notes. Some meters allow you to use areas of your body other than your finger (alternative sites) to test your blood. The most common alternative sites are:  Forearm.  Thigh.  Palm of the hand. If you think you may have hypoglycemia, or if you have a history of not knowing when your blood glucose is getting low (hypoglycemia unawareness), do not use alternative sites. Use your finger instead.  Alternative sites may not be as accurate as the fingers, because blood flow is slower in these areas. This means that the result you get may be delayed, and it may be different from the result that you would get from your finger. Follow these instructions at home: Blood glucose log   Every time you check your blood glucose, write down your result. Also write down any notes about things that may be affecting your blood glucose, such as your diet and exercise for the day. This information can help you and your health care provider: ? Look for patterns in your blood glucose over time. ? Adjust your diabetes management plan as needed.  Check if your meter allows you to download your records to a computer. Most glucose meters store a record of glucose readings in the meter. If you have type 1 diabetes:  Check your blood glucose 2 or more times a day.  Also check your blood glucose: ? Before every insulin injection. ? Before and after exercise. ? Before meals. ? 2 hours after a meal. ? Occasionally between 2:00 a.m. and 3:00 a.m., as directed. ? Before potentially dangerous tasks, like driving or using heavy machinery. ? At bedtime.  You may need to check your blood glucose more often, up to 6-10 times a day, if you: ? Use an insulin pump. ? Need multiple daily injections (MDI). ? Have diabetes that is not well-controlled. ? Are ill. ? Have a history of severe hypoglycemia. ? Have hypoglycemia unawareness. If you have type 2 diabetes:  If you take insulin or other diabetes medicines, check your blood glucose 2 or more times a day.  If you are on intensive insulin therapy, check your blood glucose 4 or more times a day. Occasionally, you may also need to check between 2:00 a.m. and 3:00 a.m., as directed.  Also check your blood glucose: ? Before and after exercise. ? Before potentially dangerous tasks, like driving or using heavy machinery.  You may need to check your blood glucose  more often if: ? Your medicine is being adjusted. ? Your diabetes is not well-controlled. ? You are  ill. General tips  Always keep your supplies with you.  If you have questions or need help, all blood glucose meters have a 24-hour "hotline" phone number that you can call. You may also contact your health care provider.  After you use a few boxes of test strips, adjust (calibrate) your blood glucose meter by following instructions that came with your meter. Contact a health care provider if:  Your blood glucose is at or above 240 mg/dL (16.113.3 mmol/L) for 2 days in a row.  You have been sick or have had a fever for 2 days or longer, and you are not getting better.  You have any of the following problems for more than 6 hours: ? You cannot eat or drink. ? You have nausea or vomiting. ? You have diarrhea. Get help right away if:  Your blood glucose is lower than 54 mg/dL (3 mmol/L).  You become confused or you have trouble thinking clearly.  You have difficulty breathing.  You have moderate or large ketone levels in your urine. Summary  Monitoring your blood sugar (glucose) is an important part of managing your diabetes (diabetes mellitus).  Blood glucose monitoring involves checking your blood glucose as often as directed and keeping a record (log) of your results over time.  Your health care provider will set individualized treatment goals for you. Your goals will be based on your age, other medical conditions you have, and how you respond to diabetes treatment.  Every time you check your blood glucose, write down your result. Also write down any notes about things that may be affecting your blood glucose, such as your diet and exercise for the day. This information is not intended to replace advice given to you by your health care provider. Make sure you discuss any questions you have with your health care provider. Document Released: 05/04/2003 Document Revised: 02/22/2018  Document Reviewed: 10/11/2015 Elsevier Patient Education  2020 [employer]

## 2018-11-13 NOTE — Telephone Encounter (Signed)
Glucometer and supplies needed

## 2018-11-20 ENCOUNTER — Other Ambulatory Visit: Payer: Self-pay | Admitting: Internal Medicine

## 2018-11-20 DIAGNOSIS — I10 Essential (primary) hypertension: Secondary | ICD-10-CM

## 2018-12-16 NOTE — Progress Notes (Signed)
Diabetes Education and Follow-Up Visit  53 y.o.female presents for diabetic education. She has Diabetes Mellitus type 2:  with diabetic chronic kidney disease, she is on bASA, and denies increased appetite, nausea, paresthesia of the feet, polydipsia, polyuria, visual disturbances, vomiting and weight loss.  She was well controlled by lifestyle modification only until recently but jumped to A1C of 10.4 at last visit and was newly initiated on metfomin  At last visit:  New diabetic; patient quite resistant to medication, low pill burden is priority, motivated to improve lifestyle and reverse with goal to get off medications Limited health literacy Re: diabetes  Education: Reviewed 'ABCs' of diabetes management (respective goals in parentheses):  A1C (<7), blood pressure (<130/80), and cholesterol (LDL <70)  Last hemoglobin A2ZA1c was: Lab Results  Component Value Date   HGBA1C 10.4 (H) 11/05/2018   HGBA1C 7.2 (H) 05/10/2018   HGBA1C 7.1 (H) 02/04/2018    BMI is Body mass index is 29.52 kg/m., she has been working on diet but not much exercise, does have treadmill but not using at this time, plans to start.  Wt Readings from Last 3 Encounters:  12/17/18 161 lb 6.4 oz (73.2 kg)  11/13/18 163 lb (73.9 kg)  11/05/18 162 lb (73.5 kg)    Pt is on a regimen of: Glucophage XR - currently 2 tabs daily, working on increasing to 4 tabs, slowly adjusting to large pills  Glucometer: new machine, unsure, has all supplies she needs  Checking fasting daily: Fasting ranged 235 (early July) trending down - recently in 150s-160s  Exercise: not much currently, has treadmill but hasn't started   Diet Breakfast: packet of oatmeal - plain - with tsp of butter, 1/2 tsp of sugar (has been skipping more recently Lunch: Sandwich - alternates between white/wheat bread with mayo, lunch meat and cheese, occasional salad Snack: potato chips occasionally (has reduced), fresh fruit, cookies (less than  previous) Dinner: red meat, chicken, spaghetti (has reduced), tacos,   Drinks: has cut out on sweet tea other than when was on vacation last week, otherwise just water - estimates 4+ bottles of water  Patient does not have CKD She is not on ACE/ARB   Lab Results  Component Value Date   GFRNONAA 92 11/05/2018    Lab Results  Component Value Date   CREATININE 0.75 11/05/2018   BUN 13 11/05/2018   NA 133 (L) 11/05/2018   K 3.8 11/05/2018   CL 94 (L) 11/05/2018   CO2 27 11/05/2018    Lab Results  Component Value Date   MICROALBUR 0.3 11/05/2018     She is not on a Statin. She is on fenofibrate 145 mg daily for elevated triglycerides  She is not at goal of less than 70.  Lab Results  Component Value Date   CHOL 225 (H) 11/05/2018   HDL 29 (L) 11/05/2018   LDLCALC  11/05/2018     Comment:     . LDL cholesterol not calculated. Triglyceride levels greater than 400 mg/dL invalidate calculated LDL results. . Reference range: <100 . Desirable range <100 mg/dL for primary prevention;   <70 mg/dL for patients with CHD or diabetic patients  with > or = 2 CHD risk factors. Marland Kitchen. LDL-C is now calculated using the Martin-Hopkins  calculation, which is a validated novel method providing  better accuracy than the Friedewald equation in the  estimation of LDL-C.  Horald PollenMartin SS et al. Lenox AhrJAMA. 3086;578([id number];310(19): 2061-2068  (http://education.[employer].com/faq/FAQ164)    TRIG 805 (H) 11/05/2018  CHOLHDL 7.8 (H) 11/05/2018     Problem List has Essential hypertension; Hypothyroidism; Hyperlipidemia associated with type 2 diabetes mellitus (HCC); Irregular periods/menstrual cycles; Vitamin D deficiency; Medication management; Obesity (BMI 30.0-34.9); FHx: heart disease; and Type 2 diabetes mellitus with stage 2 chronic kidney disease, without long-term current use of insulin (HCC) on their problem list.  Medications Current Outpatient Medications on File Prior to Visit  Medication Sig   . aspirin EC 81 MG tablet Take 81 mg by mouth daily.  Marland Kitchen. atenolol (TENORMIN) 100 MG tablet Take 1 tablet Daily for BP  . fenofibrate micronized (LOFIBRA) 134 MG capsule Take 1 capsule Daily for Triglycerides  /  Blood Fats  . Glucose Blood (BLOOD GLUCOSE TEST STRIPS) STRP Test blood sugar once daily  . hydrochlorothiazide (HYDRODIURIL) 25 MG tablet TAKE 1 TABLET BY MOUTH EVERY DAY  . Lancets MISC Test blood sugar once daily  . levothyroxine (SYNTHROID) 112 MCG tablet Take 1 tablet daily on an empty stomach with only water for 30 minutes & no Antacid meds, Calcium or Magnesium for 4 hours & avoid Biotin  . metFORMIN (GLUCOPHAGE-XR) 500 MG 24 hr tablet Take 2 tablets 2 x/ day with a meal for Diabetes (Patient taking differently: Takes one tablet in the morning and one tablet in the evening)   No current facility-administered medications on file prior to visit.     ROS- see HPI  Physical Exam: Blood pressure (!) 142/82, pulse 62, temperature (!) 96.6 F (35.9 C), height 5\' 2"  (1.575 m), weight 161 lb 6.4 oz (73.2 kg), SpO2 98 %. Body mass index is 29.52 kg/m. General Appearance: Well nourished, in no apparent distress. Eyes: PERRLA, EOMs, conjunctiva no swelling or erythema ENT/Mouth: Ext aud canals clear, TMs without erythema, bulging. No erythema, swelling, or exudate on post pharynx.  Tonsils not swollen or erythematous. Hearing normal.  Respiratory: Respiratory effort normal, BS equal bilaterally without rales, rhonchi, wheezing or stridor.  Cardio: RRR with no MRGs. Brisk peripheral pulses without edema.  Abdomen: Soft, + BS.  Non tender, no guarding, rebound, hernias, masses. Musculoskeletal: Full ROM, 5/5 strength, normal gait.  Skin: Warm, dry without rashes, lesions, ecchymosis.  Neuro: Cranial nerves intact. Normal muscle tone, no cerebellar symptoms. Sensation intact.    Plan and Assessment: Diabetes New diabetic; patient quite resistant to medication, low pill burden is  priority, motivated to improve lifestyle and reverse with goal to get off medications Limited health literacy Re: diabetes  Education: Reviewed 'ABCs' of diabetes management (respective goals in parentheses):  A1C (<7), blood pressure (<130/80), and cholesterol (LDL <70)  Checking fructosamine today    Continue titration up with metformin to 4 tabs daily - take with meals to limit SE  Discussed transition from HCTZ to losartan/hctz - she declines change  Discussed statin; continues to decline for now; will continue fenofibrate and lifestyle for trigs and reevaluate at next OV; may consider switch from fenofibrate to statin if possible with improved trigs  Eye Exam yearly and Dental Exam every 6 months.- requested report from [facility]  Dietary recommendations reviewed at length  Physical Activity recommendations given; aim for 150 min weekly - start with 15-20 min walking daily   Given sugar log -continue to check fasting daily and PRN - goal fasting <130    Future Appointments  Date Time Provider Department Center  02/06/2019  8:45 AM Judd Gaudierorbett, Izack Hoogland, NP [facility] None  05/12/2019  9:30 AM Lucky CowboyMcKeown, William, MD [facility] None  11/10/2019  9:00 AM Oneta RackMcKeown,  Gwyndolyn Saxon, MD [facility] None

## 2018-12-17 ENCOUNTER — Encounter: Payer: Self-pay | Admitting: Adult Health

## 2018-12-17 ENCOUNTER — Other Ambulatory Visit: Payer: Self-pay

## 2018-12-17 ENCOUNTER — Ambulatory Visit (INDEPENDENT_AMBULATORY_CARE_PROVIDER_SITE_OTHER): Payer: BC Managed Care – PPO | Admitting: Adult Health

## 2018-12-17 VITALS — BP 142/82 | HR 62 | Temp 96.6°F | Ht 62.0 in | Wt 161.4 lb

## 2018-12-17 DIAGNOSIS — E1122 Type 2 diabetes mellitus with diabetic chronic kidney disease: Secondary | ICD-10-CM

## 2018-12-17 DIAGNOSIS — E785 Hyperlipidemia, unspecified: Secondary | ICD-10-CM

## 2018-12-17 DIAGNOSIS — E1169 Type 2 diabetes mellitus with other specified complication: Secondary | ICD-10-CM | POA: Diagnosis not present

## 2018-12-17 DIAGNOSIS — E669 Obesity, unspecified: Secondary | ICD-10-CM

## 2018-12-17 DIAGNOSIS — Z79899 Other long term (current) drug therapy: Secondary | ICD-10-CM

## 2018-12-17 DIAGNOSIS — I10 Essential (primary) hypertension: Secondary | ICD-10-CM | POA: Diagnosis not present

## 2018-12-17 DIAGNOSIS — N182 Chronic kidney disease, stage 2 (mild): Secondary | ICD-10-CM

## 2018-12-17 DIAGNOSIS — E1165 Type 2 diabetes mellitus with hyperglycemia: Secondary | ICD-10-CM

## 2018-12-17 NOTE — Patient Instructions (Addendum)
Goals    . Blood Pressure < 130/80    . Exercise 150 min/wk Moderate Activity    . Fasting Blood Glucose <130    . HEMOGLOBIN A1C < 7    . LDL CALC < 70    . Weight (lb) < 145 lb (65.8 kg)       Keep sweet tea to once a week   Start exercise - try walking 15-20 min daily   Please try to increase metformin up to 4 tabs if possible   Goal is to continue to improve blood sugars until <130 most mornings - try to see if you can figure out what caused any unusually high blood sugars  Lunch: try leaving off bread, salads, soups (anything without a lot of white potatoes - lentil soup, veggie soup, chili)   If you have starches/carbs (bread, pasta, potatoes) try to keep the portion size small       Bad carbs also include fruit juice, alcohol, and sweet tea. These are empty calories that do not signal to your brain that you are full.   Please remember the good carbs are still carbs which convert into sugar. So please measure them out no more than 1/2-1 cup of rice, oatmeal, pasta, and beans  Veggies are however free foods! Pile them on.   Not all fruit is created equal. Please see the list below, the fruit at the bottom is higher in sugars than the fruit at the top. Please avoid all dried fruits.

## 2018-12-24 LAB — FRUCTOSAMINE: Fructosamine: 284 umol/L (ref 205–285)

## 2018-12-31 ENCOUNTER — Encounter: Payer: Self-pay | Admitting: Internal Medicine

## 2019-02-04 NOTE — Progress Notes (Signed)
FOLLOW UP  Assessment and Plan:   Hypertension Didn't tolerate olmesartan, lisinopril; she is doing well with HCTZ; declines trial of losartan  Have dicussed benefits of ARBs with diabetes Monitor blood pressure at home; patient to call if consistently greater than 130/80 Continue DASH diet.   Reminder to go to the ER if any CP, SOB, nausea, dizziness, severe HA, changes vision/speech, left arm numbness and tingling and jaw pain.  Cholesterol Currently above goal; emphasized need for diet adherence, continue fenofibrate- take daily, consider adding omega 3 LDL goal <70 for T2DM, recommended statin; consider switching from fenofibrate if trigs significantly improved with glucose control  Continue low cholesterol diet and exercise.  Check lipid panel.   T2DM Metformin 500 mg BID - very resistant to dose change, hesitantly agrees to increase to TID Fructosamine reassuring at last diabetes education visit though admits to poor diet over the last month  Continue diet and exercise.  Perform daily foot/skin check, notify office of any concerning changes.  Check A1C  BMI 29 Long discussion about weight loss, diet, and exercise Recommended diet heavy in fruits and veggies and low in animal meats, cheeses, and dairy products, appropriate calorie intake Discussed ideal weight for height  Will follow up in 3 months  Hypothyroidism continue medications the same pending lab results reminded to take on an empty stomach 30-71mins before food.  check TSH level  Vitamin D Def Below goal at last visit; she has initiated supplement but taking very infrequently Encouraged daily adherence goal of 60-100 defer Vit D level  Continue diet and meds as discussed. Further disposition pending results of labs. Discussed med's effects and SE's.   Over 30 minutes of exam, counseling, chart review, and critical decision making was performed.   Future Appointments  Date Time Provider [place]   04/01/2019  3:30 PM Huel Cote, NP [facility]  05/12/2019  9:30 AM Unk Pinto, MD [facility] None  11/10/2019  9:00 AM Unk Pinto, MD [facility] None    ----------------------------------------------------------------------------------------------------------------------  HPI 53 y.o. female  presents for 3 month follow up on hypertension, cholesterol, T2 diabetes, weight, hypothyroidism and vitamin D deficiency.   BMI is Body mass index is 29.26 kg/m., she has been working on diet and exercise.  Wt Readings from Last 3 Encounters:  02/06/19 160 lb (72.6 kg)  12/17/18 161 lb 6.4 oz (73.2 kg)  11/13/18 163 lb (73.9 kg)   Didn't tolerate olmesartan, unsure of reaction, lisinopril with dry cough, declines to try another ARB at this time.  Her blood pressure has not been controlled at home, today their BP is BP: 130/80  She does workout. She denies chest pain, shortness of breath, dizziness.   She is on cholesterol medication (fenofibrate- currently taking daily, not on statin and very resistant to adding) and denies myalgias. Her cholesterol is not at goal. The cholesterol last visit was:   Lab Results  Component Value Date   CHOL 225 (H) 11/05/2018   HDL 29 (L) 11/05/2018   [place]  11/05/2018     Comment:     . LDL cholesterol not calculated. Triglyceride levels greater than 400 mg/dL invalidate calculated LDL results. . Reference range: <100 . Desirable range <100 mg/dL for primary prevention;   <70 mg/dL for patients with CHD or diabetic patients  with > or = 2 CHD risk factors. Marland Kitchen LDL-C is now calculated using the Martin-Hopkins  calculation, which is a validated novel method providing  better accuracy than the Friedewald equation  in the  estimation of LDL-C.  Horald Pollen et al. Lenox Ahr. 5176;160(73): 2061-2068  (http://education.[facility].com/faq/FAQ164)    TRIG 805 (H) 11/05/2018   CHOLHDL 7.8 (H) 11/05/2018    She has been working on diet and  exercise for T2DM with last A1C jumped from 7.2% in 04/2018 to 10.4% on 11/05/2018, has followed up x 2 for diabetes education visits with reassuring fructosamine of 284 on 12/17/2018 after lifestyle interventions She is very resistant to adding medications and concern with pill burden  Currently taking metformin 500 mg BID  She forgot glucose log but reports has ranged 130-150.  She denies foot ulcerations, increased appetite, nausea, paresthesia of the feet, polydipsia, polyuria, visual disturbances, vomiting and weight loss. Last A1C in the office was:  Lab Results  Component Value Date   HGBA1C 10.4 (H) 11/05/2018   Lab Results  Component Value Date   GFRNONAA 92 11/05/2018   She is on thyroid medication. Her medication was not changed last visit.  Taking 112 mcg daily  Lab Results  Component Value Date   TSH 5.49 (H) 11/05/2018  . Patient is not on Vitamin D supplement despite recommendation Lab Results  Component Value Date   VD25OH 55 (L) 11/05/2018        Current Medications:  Current Outpatient Medications on File Prior to Visit  Medication Sig  . atenolol (TENORMIN) 100 MG tablet Take 1 tablet Daily for BP  . fenofibrate micronized (LOFIBRA) 134 MG capsule Take 1 capsule Daily for Triglycerides  /  Blood Fats  . Glucose Blood (BLOOD GLUCOSE TEST STRIPS) STRP Test blood sugar once daily  . hydrochlorothiazide (HYDRODIURIL) 25 MG tablet TAKE 1 TABLET BY MOUTH EVERY DAY  . Lancets MISC Test blood sugar once daily  . levothyroxine (SYNTHROID) 112 MCG tablet Take 1 tablet daily on an empty stomach with only water for 30 minutes & no Antacid meds, Calcium or Magnesium for 4 hours & avoid Biotin  . metFORMIN (GLUCOPHAGE-XR) 500 MG 24 hr tablet Take 2 tablets 2 x/ day with a meal for Diabetes (Patient taking differently: Takes one tablet in the morning and one tablet in the evening)  . aspirin EC 81 MG tablet Take 81 mg by mouth daily.   No current facility-administered  medications on file prior to visit.      Allergies:  Allergies  Allergen Reactions  . Codeine   . Ppd [Tuberculin Purified Protein Derivative]     Positive reaction 2001  . Lisinopril     Dry cough     Medical History:  Past Medical History:  Diagnosis Date  . Hypertension   . Hypothyroidism    Family history- Reviewed and unchanged Social history- Reviewed and unchanged   Review of Systems:  Review of Systems  Constitutional: Negative for malaise/fatigue and weight loss.  HENT: Negative for hearing loss and tinnitus.   Eyes: Negative for blurred vision and double vision.  Respiratory: Negative for cough, shortness of breath and wheezing.   Cardiovascular: Negative for chest pain, palpitations, orthopnea, claudication and leg swelling.  Gastrointestinal: Negative for abdominal pain, blood in stool, constipation, diarrhea, heartburn, melena, nausea and vomiting.  Genitourinary: Negative.   Musculoskeletal: Negative for joint pain and myalgias.  Skin: Negative for rash.  Neurological: Negative for dizziness, tingling, sensory change, weakness and headaches.  Endo/Heme/Allergies: Negative for polydipsia.  Psychiatric/Behavioral: Negative.   All other systems reviewed and are negative.     Physical Exam: BP 130/80   Pulse 61   Wt 160  lb (72.6 kg)   SpO2 98%   BMI 29.26 kg/m  Wt Readings from Last 3 Encounters:  02/06/19 160 lb (72.6 kg)  12/17/18 161 lb 6.4 oz (73.2 kg)  11/13/18 163 lb (73.9 kg)   General Appearance: Well nourished, in no apparent distress. Eyes: PERRLA, EOMs, conjunctiva no swelling or erythema Sinuses: No Frontal/maxillary tenderness ENT/Mouth: Ext aud canals clear, TMs without erythema, bulging. No erythema, swelling, or exudate on post pharynx.  Tonsils not swollen or erythematous. Hearing normal.  Neck: Supple, thyroid normal.  Respiratory: Respiratory effort normal, BS equal bilaterally without rales, rhonchi, wheezing or stridor.   Cardio: RRR with no MRGs. Brisk peripheral pulses without edema.  Abdomen: Soft, + BS.  Non tender, no guarding, rebound, hernias, masses. Lymphatics: Non tender without lymphadenopathy.  Musculoskeletal: Full ROM, 5/5 strength, Normal gait Skin: Warm, dry without rashes, lesions, ecchymosis.  Neuro: Cranial nerves intact. No cerebellar symptoms.  Psych: Awake and oriented X 3, normal affect, Insight and Judgment appropriate.    Dan Maker, NP 9:03 AM [facility]

## 2019-02-06 ENCOUNTER — Other Ambulatory Visit: Payer: Self-pay

## 2019-02-06 ENCOUNTER — Ambulatory Visit (INDEPENDENT_AMBULATORY_CARE_PROVIDER_SITE_OTHER): Payer: BC Managed Care – PPO | Admitting: Adult Health

## 2019-02-06 ENCOUNTER — Encounter: Payer: Self-pay | Admitting: Adult Health

## 2019-02-06 VITALS — BP 130/80 | HR 61 | Wt 160.0 lb

## 2019-02-06 DIAGNOSIS — E1169 Type 2 diabetes mellitus with other specified complication: Secondary | ICD-10-CM

## 2019-02-06 DIAGNOSIS — E039 Hypothyroidism, unspecified: Secondary | ICD-10-CM

## 2019-02-06 DIAGNOSIS — E785 Hyperlipidemia, unspecified: Secondary | ICD-10-CM | POA: Diagnosis not present

## 2019-02-06 DIAGNOSIS — E1122 Type 2 diabetes mellitus with diabetic chronic kidney disease: Secondary | ICD-10-CM

## 2019-02-06 DIAGNOSIS — Z79899 Other long term (current) drug therapy: Secondary | ICD-10-CM | POA: Diagnosis not present

## 2019-02-06 DIAGNOSIS — N182 Chronic kidney disease, stage 2 (mild): Secondary | ICD-10-CM

## 2019-02-06 DIAGNOSIS — I10 Essential (primary) hypertension: Secondary | ICD-10-CM

## 2019-02-06 DIAGNOSIS — E559 Vitamin D deficiency, unspecified: Secondary | ICD-10-CM

## 2019-02-06 DIAGNOSIS — Z6829 Body mass index (BMI) 29.0-29.9, adult: Secondary | ICD-10-CM

## 2019-02-06 NOTE — Patient Instructions (Addendum)
Goals    . Blood Pressure < 130/80    . Exercise 150 min/wk Moderate Activity    . Fasting Blood Glucose <130    . HEMOGLOBIN A1C < 7    . LDL CALC < 70    . Weight (lb) < 145 lb (65.8 kg)       Please monitor sugars closely and continue with weight loss and dietary changes until fasting is consistently <130     Drink 1/2 your body weight in fluid ounces of water daily; drink a tall glass of water 30 min before meals  Don't eat until you're stuffed- listen to your stomach and eat until you are 80% full   Try eating off of a salad plate; wait 10 min after finishing before going back for seconds  Start by eating the vegetables on your plate; aim for 60%50% of your meals to be fruits or vegetables  Then eat your protein - lean meats (grass fed if possible), fish, beans, nuts in moderation  Eat your carbs/starch last ONLY if you still are hungry. If you can, stop before finishing it all  Avoid sugar and flour - the closer it looks to it's original form in nature, typically the better it is for you  Splurge in moderation - "assign" days when you get to splurge and have the "bad stuff" - I like to follow a 80% - 20% plan- "good" choices 80 % of the time, "bad" choices in moderation 20% of the time  Simple equation is: Calories out > calories in = weight loss - even if you eat the bad stuff, if you limit portions, you will still lose weight        Diabetes Mellitus and Nutrition, Adult When you have diabetes (diabetes mellitus), it is very important to have healthy eating habits because your blood sugar (glucose) levels are greatly affected by what you eat and drink. Eating healthy foods in the appropriate amounts, at about the same times every day, can help you:  Control your blood glucose.  Lower your risk of heart disease.  Improve your blood pressure.  Reach or maintain a healthy weight. Every person with diabetes is different, and each person has different needs for a meal  plan. Your health care provider may recommend that you work with a diet and nutrition specialist (dietitian) to make a meal plan that is best for you. Your meal plan may vary depending on factors such as:  The calories you need.  The medicines you take.  Your weight.  Your blood glucose, blood pressure, and cholesterol levels.  Your activity level.  Other health conditions you have, such as heart or kidney disease. How do carbohydrates affect me? Carbohydrates, also called carbs, affect your blood glucose level more than any other type of food. Eating carbs naturally raises the amount of glucose in your blood. Carb counting is a method for keeping track of how many carbs you eat. Counting carbs is important to keep your blood glucose at a healthy level, especially if you use insulin or take certain oral diabetes medicines. It is important to know how many carbs you can safely have in each meal. This is different for every person. Your dietitian can help you calculate how many carbs you should have at each meal and for each snack. Foods that contain carbs include:  Bread, cereal, rice, pasta, and crackers.  Potatoes and corn.  Peas, beans, and lentils.  Milk and yogurt.  Fruit and juice.  Desserts, such as cakes, cookies, ice cream, and candy. How does alcohol affect me? Alcohol can cause a sudden decrease in blood glucose (hypoglycemia), especially if you use insulin or take certain oral diabetes medicines. Hypoglycemia can be a life-threatening condition. Symptoms of hypoglycemia (sleepiness, dizziness, and confusion) are similar to symptoms of having too much alcohol. If your health care provider says that alcohol is safe for you, follow these guidelines:  Limit alcohol intake to no more than 1 drink per day for nonpregnant women and 2 drinks per day for men. One drink equals 12 oz of beer, 5 oz of wine, or 1 oz of hard liquor.  Do not drink on an empty stomach.  Keep yourself  hydrated with water, diet soda, or unsweetened iced tea.  Keep in mind that regular soda, juice, and other mixers may contain a lot of sugar and must be counted as carbs. What are tips for following this plan?  Reading food labels  Start by checking the serving size on the "Nutrition Facts" label of packaged foods and drinks. The amount of calories, carbs, fats, and other nutrients listed on the label is based on one serving of the item. Many items contain more than one serving per package.  Check the total grams (g) of carbs in one serving. You can calculate the number of servings of carbs in one serving by dividing the total carbs by 15. For example, if a food has 30 g of total carbs, it would be equal to 2 servings of carbs.  Check the number of grams (g) of saturated and trans fats in one serving. Choose foods that have low or no amount of these fats.  Check the number of milligrams (mg) of salt (sodium) in one serving. Most people should limit total sodium intake to less than 2,300 mg per day.  Always check the nutrition information of foods labeled as "low-fat" or "nonfat". These foods may be higher in added sugar or refined carbs and should be avoided.  Talk to your dietitian to identify your daily goals for nutrients listed on the label. Shopping  Avoid buying canned, premade, or processed foods. These foods tend to be high in fat, sodium, and added sugar.  Shop around the outside edge of the grocery store. This includes fresh fruits and vegetables, bulk grains, fresh meats, and fresh dairy. Cooking  Use low-heat cooking methods, such as baking, instead of high-heat cooking methods like deep frying.  Cook using healthy oils, such as olive, canola, or sunflower oil.  Avoid cooking with butter, cream, or high-fat meats. Meal planning  Eat meals and snacks regularly, preferably at the same times every day. Avoid going long periods of time without eating.  Eat foods high in  fiber, such as fresh fruits, vegetables, beans, and whole grains. Talk to your dietitian about how many servings of carbs you can eat at each meal.  Eat 4-6 ounces (oz) of lean protein each day, such as lean meat, chicken, fish, eggs, or tofu. One oz of lean protein is equal to: ? 1 oz of meat, chicken, or fish. ? 1 egg. ?  cup of tofu.  Eat some foods each day that contain healthy fats, such as avocado, nuts, seeds, and fish. Lifestyle  Check your blood glucose regularly.  Exercise regularly as told by your health care provider. This may include: ? 150 minutes of moderate-intensity or vigorous-intensity exercise each week. This could be brisk walking, biking, or water aerobics. ? Stretching and doing  strength exercises, such as yoga or weightlifting, at least 2 times a week.  Take medicines as told by your health care provider.  Do not use any products that contain nicotine or tobacco, such as cigarettes and e-cigarettes. If you need help quitting, ask your health care provider.  Work with a Social worker or diabetes educator to identify strategies to manage stress and any emotional and social challenges. Questions to ask a health care provider  Do I need to meet with a diabetes educator?  Do I need to meet with a dietitian?  What number can I call if I have questions?  When are the best times to check my blood glucose? Where to find more information:  American Diabetes Association: diabetes.org  Academy of Nutrition and Dietetics: www.eatright.[employer] of Diabetes and Digestive and Kidney Diseases (NIH): [employer] Summary  A healthy meal plan will help you control your blood glucose and maintain a healthy lifestyle.  Working with a diet and nutrition specialist (dietitian) can help you make a meal plan that is best for you.  Keep in mind that carbohydrates (carbs) and alcohol have immediate effects on your blood glucose levels. It is important to count  carbs and to use alcohol carefully. This information is not intended to replace advice given to you by your health care provider. Make sure you discuss any questions you have with your health care provider. Document Released: 01/26/2005 Document Revised: 04/13/2017 Document Reviewed: 06/05/2016 Elsevier Patient Education  2020 [employer].

## 2019-02-07 ENCOUNTER — Other Ambulatory Visit: Payer: Self-pay | Admitting: Adult Health

## 2019-02-07 DIAGNOSIS — R7989 Other specified abnormal findings of blood chemistry: Secondary | ICD-10-CM

## 2019-02-07 LAB — CBC WITH DIFFERENTIAL/PLATELET
Absolute Monocytes: 511 cells/uL (ref 200–950)
Basophils Absolute: 37 cells/uL (ref 0–200)
Basophils Relative: 0.5 %
Eosinophils Absolute: 148 cells/uL (ref 15–500)
Eosinophils Relative: 2 %
HCT: 41.3 % (ref 35.0–45.0)
Hemoglobin: 13.7 g/dL (ref 11.7–15.5)
Lymphs Abs: 2161 cells/uL (ref 850–3900)
MCH: 29.5 pg (ref 27.0–33.0)
MCHC: 33.2 g/dL (ref 32.0–36.0)
MCV: 89 fL (ref 80.0–100.0)
MPV: 10.5 fL (ref 7.5–12.5)
Monocytes Relative: 6.9 %
Neutro Abs: 4544 cells/uL (ref 1500–7800)
Neutrophils Relative %: 61.4 %
Platelets: 267 10*3/uL (ref 140–400)
RBC: 4.64 10*6/uL (ref 3.80–5.10)
RDW: 13.1 % (ref 11.0–15.0)
Total Lymphocyte: 29.2 %
WBC: 7.4 10*3/uL (ref 3.8–10.8)

## 2019-02-07 LAB — HEMOGLOBIN A1C
Hgb A1c MFr Bld: 7.3 % of total Hgb — ABNORMAL HIGH (ref <5.7)
Mean Plasma Glucose: 163 (calc)
eAG (mmol/L): 9 (calc)

## 2019-02-07 LAB — LIPID PANEL
Cholesterol: 195 mg/dL (ref <200)
HDL: 34 mg/dL — ABNORMAL LOW (ref >=50)
LDL Cholesterol (Calc): 112 mg/dL (calc) — ABNORMAL HIGH
Non-HDL Cholesterol (Calc): 161 mg/dL (calc) — ABNORMAL HIGH (ref <130)
Total CHOL/HDL Ratio: 5.7 (calc) — ABNORMAL HIGH (ref <5.0)
Triglycerides: 345 mg/dL — ABNORMAL HIGH (ref <150)

## 2019-02-07 LAB — COMPLETE METABOLIC PANEL WITH GFR
AG Ratio: 1.8 (calc) (ref 1.0–2.5)
ALT: 38 U/L — ABNORMAL HIGH (ref 6–29)
AST: 25 U/L (ref 10–35)
Albumin: 4.6 g/dL (ref 3.6–5.1)
Alkaline phosphatase (APISO): 45 U/L (ref 37–153)
BUN: 11 mg/dL (ref 7–25)
CO2: 27 mmol/L (ref 20–32)
Calcium: 9.7 mg/dL (ref 8.6–10.4)
Chloride: 101 mmol/L (ref 98–110)
Creat: 0.8 mg/dL (ref 0.50–1.05)
GFR, Est African American: 98 mL/min/{1.73_m2} (ref >=60)
GFR, Est Non African American: 84 mL/min/{1.73_m2} (ref >=60)
Globulin: 2.6 g/dL (calc) (ref 1.9–3.7)
Glucose, Bld: 156 mg/dL — ABNORMAL HIGH (ref 65–99)
Potassium: 4 mmol/L (ref 3.5–5.3)
Sodium: 140 mmol/L (ref 135–146)
Total Bilirubin: 0.5 mg/dL (ref 0.2–1.2)
Total Protein: 7.2 g/dL (ref 6.1–8.1)

## 2019-02-07 LAB — MAGNESIUM: Magnesium: 2 mg/dL (ref 1.5–2.5)

## 2019-02-07 LAB — TSH: TSH: 1.79 mIU/L

## 2019-02-14 ENCOUNTER — Encounter: Payer: Self-pay | Admitting: Adult Health

## 2019-02-14 ENCOUNTER — Ambulatory Visit
Admission: RE | Admit: 2019-02-14 | Discharge: 2019-02-14 | Disposition: A | Discharge status: Home / Self Care | Payer: BLUE CROSS/BLUE SHIELD | Source: Ambulatory Visit | Attending: Adult Health | Admitting: Adult Health

## 2019-02-14 DIAGNOSIS — R161 Splenomegaly, not elsewhere classified: Secondary | ICD-10-CM

## 2019-02-14 DIAGNOSIS — R7989 Other specified abnormal findings of blood chemistry: Secondary | ICD-10-CM

## 2019-02-14 DIAGNOSIS — K760 Fatty (change of) liver, not elsewhere classified: Secondary | ICD-10-CM | POA: Insufficient documentation

## 2019-02-14 HISTORY — DX: Splenomegaly, not elsewhere classified: R16.1

## 2019-02-19 DIAGNOSIS — E119 Type 2 diabetes mellitus without complications: Secondary | ICD-10-CM | POA: Diagnosis not present

## 2019-03-18 ENCOUNTER — Other Ambulatory Visit: Payer: Self-pay | Admitting: Internal Medicine

## 2019-03-18 DIAGNOSIS — E039 Hypothyroidism, unspecified: Secondary | ICD-10-CM

## 2019-03-30 ENCOUNTER — Other Ambulatory Visit: Payer: Self-pay | Admitting: Internal Medicine

## 2019-03-30 DIAGNOSIS — E782 Mixed hyperlipidemia: Secondary | ICD-10-CM

## 2019-03-31 ENCOUNTER — Other Ambulatory Visit: Payer: Self-pay

## 2019-04-01 ENCOUNTER — Ambulatory Visit (INDEPENDENT_AMBULATORY_CARE_PROVIDER_SITE_OTHER): Payer: BC Managed Care – PPO | Admitting: Women's Health

## 2019-04-01 ENCOUNTER — Encounter: Payer: Self-pay | Admitting: Women's Health

## 2019-04-01 VITALS — BP 144/78 | Ht 61.5 in | Wt 161.8 lb

## 2019-04-01 DIAGNOSIS — Z01419 Encounter for gynecological examination (general) (routine) without abnormal findings: Secondary | ICD-10-CM | POA: Diagnosis not present

## 2019-04-01 DIAGNOSIS — Z13820 Encounter for screening for osteoporosis: Secondary | ICD-10-CM

## 2019-04-01 DIAGNOSIS — Z23 Encounter for immunization: Secondary | ICD-10-CM | POA: Diagnosis not present

## 2019-04-01 NOTE — Patient Instructions (Addendum)
Good to see you today Vit D 2000 iu daily Bone density  Call breast center  [phone]  Colonoscopy  [facility]  Dr Carlean Purl  [phone]  Health Maintenance for Postmenopausal Women Menopause is a normal process in which your ability to get pregnant comes to an end. This process happens slowly over many months or years, usually between the ages of 82 and 40. Menopause is complete when you have missed your menstrual periods for 12 months. It is important to talk with your health care provider about some of the most common conditions that affect women after menopause (postmenopausal women). These include heart disease, cancer, and bone loss (osteoporosis). Adopting a healthy lifestyle and getting preventive care can help to promote your health and wellness. The actions you take can also lower your chances of developing some of these common conditions. What should I know about menopause? During menopause, you may get a number of symptoms, such as:  Hot flashes. These can be moderate or severe.  Night sweats.  Decrease in sex drive.  Mood swings.  Headaches.  Tiredness.  Irritability.  Memory problems.  Insomnia. Choosing to treat or not to treat these symptoms is a decision that you make with your health care provider. Do I need hormone replacement therapy?  Hormone replacement therapy is effective in treating symptoms that are caused by menopause, such as hot flashes and night sweats.  Hormone replacement carries certain risks, especially as you become older. If you are thinking about using estrogen or estrogen with progestin, discuss the benefits and risks with your health care provider. What is my risk for heart disease and stroke? The risk of heart disease, heart attack, and stroke increases as you age. One of the causes may be a change in the body's hormones during menopause. This can affect how your body uses dietary fats, triglycerides, and cholesterol. Heart attack and stroke  are medical emergencies. There are many things that you can do to help prevent heart disease and stroke. Watch your blood pressure  High blood pressure causes heart disease and increases the risk of stroke. This is more likely to develop in people who have high blood pressure readings, are of African descent, or are overweight.  Have your blood pressure checked: ? Every 3-5 years if you are 44-34 years of age. ? Every year if you are 27 years old or older. Eat a healthy diet   Eat a diet that includes plenty of vegetables, fruits, low-fat dairy products, and lean protein.  Do not eat a lot of foods that are high in solid fats, added sugars, or sodium. Get regular exercise Get regular exercise. This is one of the most important things you can do for your health. Most adults should:  Try to exercise for at least 150 minutes each week. The exercise should increase your heart rate and make you sweat (moderate-intensity exercise).  Try to do strengthening exercises at least twice each week. Do these in addition to the moderate-intensity exercise.  Spend less time sitting. Even light physical activity can be beneficial. Other tips  Work with your health care provider to achieve or maintain a healthy weight.  Do not use any products that contain nicotine or tobacco, such as cigarettes, e-cigarettes, and chewing tobacco. If you need help quitting, ask your health care provider.  Know your numbers. Ask your health care provider to check your cholesterol and your blood sugar (glucose). Continue to have your blood tested as directed by your health care  provider. Do I need screening for cancer? Depending on your health history and family history, you may need to have cancer screening at different stages of your life. This may include screening for:  Breast cancer.  Cervical cancer.  Lung cancer.  Colorectal cancer. What is my risk for osteoporosis? After menopause, you may be at increased  risk for osteoporosis. Osteoporosis is a condition in which bone destruction happens more quickly than new bone creation. To help prevent osteoporosis or the bone fractures that can happen because of osteoporosis, you may take the following actions:  If you are 35-38 years old, get at least 1,000 mg of calcium and at least 600 mg of vitamin D per day.  If you are older than age 69 but younger than age 38, get at least 1,200 mg of calcium and at least 600 mg of vitamin D per day.  If you are older than age 37, get at least 1,200 mg of calcium and at least 800 mg of vitamin D per day. Smoking and drinking excessive alcohol increase the risk of osteoporosis. Eat foods that are rich in calcium and vitamin D, and do weight-bearing exercises several times each week as directed by your health care provider. How does menopause affect my mental health? Depression may occur at any age, but it is more common as you become older. Common symptoms of depression include:  Low or sad mood.  Changes in sleep patterns.  Changes in appetite or eating patterns.  Feeling an overall lack of motivation or enjoyment of activities that you previously enjoyed.  Frequent crying spells. Talk with your health care provider if you think that you are experiencing depression. General instructions See your health care provider for regular wellness exams and vaccines. This may include:  Scheduling regular health, dental, and eye exams.  Getting and maintaining your vaccines. These include: ? Influenza vaccine. Get this vaccine each year before the flu season begins. ? Pneumonia vaccine. ? Shingles vaccine. ? Tetanus, diphtheria, and pertussis (Tdap) booster vaccine. Your health care provider may also recommend other immunizations. Tell your health care provider if you have ever been abused or do not feel safe at home. Summary  Menopause is a normal process in which your ability to get pregnant comes to an end.   This condition causes hot flashes, night sweats, decreased interest in sex, mood swings, headaches, or lack of sleep.  Treatment for this condition may include hormone replacement therapy.  Take actions to keep yourself healthy, including exercising regularly, eating a healthy diet, watching your weight, and checking your blood pressure and blood sugar levels.  Get screened for cancer and depression. Make sure that you are up to date with all your vaccines. This information is not intended to replace advice given to you by your health care provider. Make sure you discuss any questions you have with your health care provider. Document Released: 06/23/2005 Document Revised: 04/24/2018 Document Reviewed: 04/24/2018 Elsevier Patient Education  2020 [employer].

## 2019-04-01 NOTE — Progress Notes (Signed)
Allison Contreras [birth year] [id number]    History:    Presents for annual exam.  Postmenopausal no HRT with no bleeding.  Normal Pap and mammogram history.  Primary care manages diabetes, hypertension and hypothyroidism.  Hemoglobin A1c was 10.4 down to 7.3.  Has not had a screening colonoscopy.  Past medical history, past surgical history, family history and social history were all reviewed and documented in the EPIC chart.  Homemaker, cares for 13-month-old granddaughter Allison Contreras .  Allison Contreras 21 at [facility] doing well, son working and starting his own business.  Father deceased heart disease, mother deceased diabetes and lymphoma.  ROS:  A ROS was performed and pertinent positives and negatives are included.  Exam:  Vitals:   04/01/19 1533  BP: (!) 144/78  Weight: 161 lb 12.8 oz (73.4 kg)  Height: 5' 1.5" (1.562 m)   Body mass index is 30.08 kg/m.   General appearance:  Normal Thyroid:  Symmetrical, normal in size, without palpable masses or nodularity. Respiratory  Auscultation:  Clear without wheezing or rhonchi Cardiovascular  Auscultation:  Regular rate, without rubs, murmurs or gallops  Edema/varicosities:  Not grossly evident Abdominal  Soft,nontender, without masses, guarding or rebound.  Liver/spleen:  No organomegaly noted  Hernia:  None appreciated  Skin  Inspection:  Grossly normal   Breasts: Examined lying and sitting.     Right: Without masses, retractions, discharge or axillary adenopathy.     Left: Without masses, retractions, discharge or axillary adenopathy. Gentitourinary   Inguinal/mons:  Normal without inguinal adenopathy  External genitalia:  Normal  BUS/Urethra/Skene's glands:  Normal  Vagina:  Normal  Cervix:  Normal  Uterus:  normal in size, shape and contour.  Midline and mobile  Adnexa/parametria:     Rt: Without masses or tenderness.   Lt: Without masses or tenderness.  Anus and perineum: Normal  Digital rectal exam: Normal sphincter tone without  palpated masses or tenderness  Assessment/Plan:  53 y.o. MWF G2, P2 for annual exam with no complaints.  Postmenopausal/no HRT/no bleeding Diabetes, hypertension, hypothyroidism-primary care manages labs and meds Obesity  Plan:.  Encouraged to continue to decrease calorie/carbs, increase regular cardio type exercise.  SBEs, continue annual 3D screening mammogram, calcium rich foods, vitamin D 2000 daily.  Screening colonoscopy discussed and encouraged, Allison Contreras [facility] information given instructed to schedule.  Screening DEXA, will schedule here.  Pap normal 2019, new screening guidelines reviewed.    Allison Contreras Allison Contreras [facility], 3:46 PM 04/01/2019

## 2019-04-02 ENCOUNTER — Other Ambulatory Visit: Payer: Self-pay | Admitting: Women's Health

## 2019-04-02 DIAGNOSIS — Z1231 Encounter for screening mammogram for malignant neoplasm of breast: Secondary | ICD-10-CM

## 2019-04-08 ENCOUNTER — Other Ambulatory Visit: Payer: Self-pay

## 2019-04-09 ENCOUNTER — Other Ambulatory Visit: Payer: Self-pay | Admitting: Women's Health

## 2019-04-09 ENCOUNTER — Ambulatory Visit (INDEPENDENT_AMBULATORY_CARE_PROVIDER_SITE_OTHER): Payer: BC Managed Care – PPO

## 2019-04-09 DIAGNOSIS — Z13820 Encounter for screening for osteoporosis: Secondary | ICD-10-CM

## 2019-04-09 DIAGNOSIS — Z780 Asymptomatic menopausal state: Secondary | ICD-10-CM

## 2019-05-02 ENCOUNTER — Other Ambulatory Visit: Payer: Self-pay | Admitting: Internal Medicine

## 2019-05-11 ENCOUNTER — Encounter: Payer: Self-pay | Admitting: Internal Medicine

## 2019-05-11 NOTE — Patient Instructions (Signed)

## 2019-05-11 NOTE — Progress Notes (Signed)
History of Present Illness:      This very nice 53 y.o.  MWF presents for 6 month follow up with HTN, HLD, Pre-Diabetes and Vitamin D Deficiency.       Patient is treated for HTN (2000) & BP has been controlled at home. Today's BP is very elevated at 180/88 and confirmed. Patient has had no complaints of any cardiac type chest pain, palpitations, dyspnea / orthopnea / PND, dizziness, claudication, or dependent edema.      Hyperlipidemia is not controlled with diet & fenofibrate.  Patient apparently had very remote issues with Lipitor about 2010.  Last Lipids were not at goal:  Lab Results  Component Value Date   CHOL 195 02/06/2019   HDL 34 (L) 02/06/2019   LDLCALC 112 (H) 02/06/2019   TRIG 345 (H) 02/06/2019   CHOLHDL 5.7 (H) 02/06/2019        Also, the patient is overweight (BMI 29+) and has history PreDiabetes /Insulin Resistance (2015) and then  of T2_NIDDM (A1c 6.5% / Feb 2019) w/CKD2  & failed dietary mgmt and was started on Metformin in Sept 2019 for A1c 7.1%.  She has had no symptoms of reactive hypoglycemia, diabetic polys, paresthesias or visual blurring.  Last A1c was still not at goal:  Lab Results  Component Value Date   HGBA1C 7.3 (H) 02/06/2019        Further, the patient also has history of Vitamin D Deficiency ("20" / 2013&"16" / 2014) and last vitamin D was still very low:  Lab Results  Component Value Date   VD25OH 23 (L) 11/05/2018    Current Outpatient Medications on File Prior to Visit  Medication Sig  . atenolol (TENORMIN) 100 MG tablet Take 1 tablet Daily for BP  . fenofibrate micronized (LOFIBRA) 134 MG capsule Take 1 capsule Daily for Triglycerides (Blood Fats)  . Glucose Blood (BLOOD GLUCOSE TEST STRIPS) STRP Test blood sugar once daily  . hydrochlorothiazide (HYDRODIURIL) 25 MG tablet TAKE 1 TABLET BY MOUTH EVERY DAY  . Lancets MISC Test blood sugar once daily  . levothyroxine (SYNTHROID) 112 MCG tablet Take 1 tablet daily on an empty  stomach with only water for 30 minutes & no Antacid meds, Calcium or Magnesium for 4 hours & avoid Biotin  . metFORMIN (GLUCOPHAGE-XR) 500 MG 24 hr tablet Take 2 tablets 2 x/ day with a meal for Diabetes (Patient taking differently: Takes one tablet in the morning and one tablet in the evening)  . aspirin EC 81 MG tablet Take 81 mg by mouth daily.   No current facility-administered medications on file prior to visit.    Allergies  Allergen Reactions  . Codeine   . Ppd [Tuberculin Purified Protein Derivative]     Positive reaction 2001  . Lisinopril     Dry cough    PMHx:   Past Medical History:  Diagnosis Date  . Hypertension   . Hypothyroidism   . Splenomegaly 02/14/2019   02/14/2019 abd [place]: Spleen measures 14.1 x 15.6 x 4.7 cm with a measured splenic volume of 542 cubic cm. No focal splenic lesions are evident.   Immunization History  Administered Date(s) Administered  . Influenza,inj,Quad PF,6+ Mos 04/01/2019  . Td 06/29/2001, 07/31/2012  . Tdap 07/31/2012   Past Surgical History:  Procedure Laterality Date  . LYMPH NODE DISSECTION     NECK- BENIGN    FHx:    Reviewed / unchanged  SHx:    Reviewed / unchanged  Systems Review:  Constitutional: Denies fever, chills, wt changes, headaches, insomnia, fatigue, night sweats, change in appetite. Eyes: Denies redness, blurred vision, diplopia, discharge, itchy, watery eyes.  ENT: Denies discharge, congestion, post nasal drip, epistaxis, sore throat, earache, hearing loss, dental pain, tinnitus, vertigo, sinus pain, snoring.  CV: Denies chest pain, palpitations, irregular heartbeat, syncope, dyspnea, diaphoresis, orthopnea, PND, claudication or edema. Respiratory: denies cough, dyspnea, DOE, pleurisy, hoarseness, laryngitis, wheezing.  Gastrointestinal: Denies dysphagia, odynophagia, heartburn, reflux, water brash, abdominal pain or cramps, nausea, vomiting, bloating, diarrhea, constipation, hematemesis, melena, hematochezia   or hemorrhoids. Genitourinary: Denies dysuria, frequency, urgency, nocturia, hesitancy, discharge, hematuria or flank pain. Musculoskeletal: Denies arthralgias, myalgias, stiffness, jt. swelling, pain, limping or strain/sprain.  Skin: Denies pruritus, rash, hives, warts, acne, eczema or change in skin lesion(s). Neuro: No weakness, tremor, incoordination, spasms, paresthesia or pain. Psychiatric: Denies confusion, memory loss or sensory loss. Endo: Denies change in weight, skin or hair change.  Heme/Lymph: No excessive bleeding, bruising or enlarged lymph nodes.  Physical Exam  BP (!) 180/88   Pulse 60   Temp (!) 97 F (36.1 C)   Resp 16   Ht  (1.575 m)   Wt 160 lb 12.8 oz (72.9 kg)   BMI 29.41 kg/m   Appears  over nourished, well groomed  and in no distress.  Eyes: PERRLA, EOMs, conjunctiva no swelling or erythema. Sinuses: No frontal/maxillary tenderness ENT/Mouth: EAC's clear, TM's nl w/o erythema, bulging. Nares clear w/o erythema, swelling, exudates. Oropharynx clear without erythema or exudates. Oral hygiene is good. Tongue normal, non obstructing. Hearing intact.  Neck: Supple. Thyroid not palpable. Car 2+/2+ without bruits, nodes or JVD. Chest: Respirations nl with BS clear & equal w/o rales, rhonchi, wheezing or stridor.  Cor: Heart sounds normal w/ regular rate and rhythm without sig. murmurs, gallops, clicks or rubs. Peripheral pulses normal and equal  without edema.  Abdomen: Soft & bowel sounds normal. Non-tender w/o guarding, rebound, hernias, masses or organomegaly.  Lymphatics: Unremarkable.  Musculoskeletal: Full ROM all peripheral extremities, joint stability, 5/5 strength and normal gait.  Skin: Warm, dry without exposed rashes, lesions or ecchymosis apparent.  Neuro: Cranial nerves intact, reflexes equal bilaterally. Sensory-motor testing grossly intact. Tendon reflexes grossly intact.  Pysch: Alert & oriented x 3.  Insight and judgement nl & appropriate. No  ideations.  Assessment and Plan:  1. Essential hypertension  - Add Losartan 100 mg for BP & Diabetic Kidney Protection  - Continue medication, monitor blood pressure at home.  - Continue DASH diet.  Reminder to go to the ER if any CP,  SOB, nausea, dizziness, severe HA, changes vision/speech.  - CBC with Diff - COMPLETE METABOLIC PANEL WITH GFR - Magnesium - TSH  2. Hyperlipidemia associated with type 2 diabetes mellitus (HCC)  - Recommend stricter diet/meds, exercise,& lifestyle modifications.  - Continue monitor periodic cholesterol/liver & renal functions  - Add a statin for better control pending lab results  - Lipid Profile - TSH  3. Type 2 diabetes mellitus with stage 2 chronic kidney disease,  without long-term current use of insulin (HCC)  - Recommend stricter diet, exercise  - Lifestyle modifications.  - Monitor appropriate labs.  - Hemoglobin A1c ([facility]) - Insulin, random  4. Vitamin D deficiency  - Continue supplementation.  - Vitamin D (25 hydroxy)  5. Poorly controlled type 2 diabetes mellitus (HCC)  - Recommend stricter diet, exercise  - Lifestyle modifications.  - Monitor appropriate labs.  - Hemoglobin A1c ([facility]) - Insulin, random  6. Hypothyroidism  - TSH  7. Medication management  - CBC with Diff - COMPLETE METABOLIC PANEL WITH GFR - Magnesium - Lipid Profile - TSH - Hemoglobin A1c ([facility]) - Insulin, random - Vitamin D (25 hydroxy)       Discussed  regular exercise, BP monitoring, weight control to achieve/maintain BMI less than 25 and discussed med and SE's. Advised consider taking Phentermine  / Topiramate for weight loss.  Patient is very reluctant tp take new meds for BP, and also for Cholesterol  or weight loss even though she was advised of the potential "curing" of her Diabetes if she lost weight.        Recommended labs to assess and monitor clinical status with further disposition pending results of labs.          I discussed the assessment and treatment plan with the patient. The patient was provided an opportunity to ask questions and all were answered. The patient agreed with the plan and demonstrated an understanding of the instructions.  I provided over 30 minutes of exam, counseling, chart review and  complex critical decision making.   Kirtland Bouchard, MD

## 2019-05-12 ENCOUNTER — Other Ambulatory Visit: Payer: Self-pay

## 2019-05-12 ENCOUNTER — Ambulatory Visit (INDEPENDENT_AMBULATORY_CARE_PROVIDER_SITE_OTHER): Payer: BC Managed Care – PPO | Admitting: Internal Medicine

## 2019-05-12 VITALS — BP 180/88 | HR 60 | Temp 97.0°F | Resp 16 | Ht 62.0 in | Wt 160.8 lb

## 2019-05-12 DIAGNOSIS — Z79899 Other long term (current) drug therapy: Secondary | ICD-10-CM | POA: Diagnosis not present

## 2019-05-12 DIAGNOSIS — N182 Chronic kidney disease, stage 2 (mild): Secondary | ICD-10-CM

## 2019-05-12 DIAGNOSIS — E1165 Type 2 diabetes mellitus with hyperglycemia: Secondary | ICD-10-CM

## 2019-05-12 DIAGNOSIS — I10 Essential (primary) hypertension: Secondary | ICD-10-CM | POA: Diagnosis not present

## 2019-05-12 DIAGNOSIS — E559 Vitamin D deficiency, unspecified: Secondary | ICD-10-CM

## 2019-05-12 DIAGNOSIS — E1122 Type 2 diabetes mellitus with diabetic chronic kidney disease: Secondary | ICD-10-CM | POA: Diagnosis not present

## 2019-05-12 DIAGNOSIS — E1169 Type 2 diabetes mellitus with other specified complication: Secondary | ICD-10-CM | POA: Diagnosis not present

## 2019-05-12 DIAGNOSIS — E039 Hypothyroidism, unspecified: Secondary | ICD-10-CM

## 2019-05-12 DIAGNOSIS — E785 Hyperlipidemia, unspecified: Secondary | ICD-10-CM

## 2019-05-12 MED ORDER — LOSARTAN POTASSIUM 100 MG PO TABS: ORAL_TABLET | ORAL | 3 refills | Status: DC

## 2019-05-13 ENCOUNTER — Other Ambulatory Visit: Payer: Self-pay | Admitting: Internal Medicine

## 2019-05-13 DIAGNOSIS — E782 Mixed hyperlipidemia: Secondary | ICD-10-CM

## 2019-05-13 LAB — COMPLETE METABOLIC PANEL WITH GFR
AG Ratio: 1.6 (calc) (ref 1.0–2.5)
ALT: 38 U/L — ABNORMAL HIGH (ref 6–29)
AST: 22 U/L (ref 10–35)
Albumin: 4.6 g/dL (ref 3.6–5.1)
Alkaline phosphatase (APISO): 49 U/L (ref 37–153)
BUN: 10 mg/dL (ref 7–25)
CO2: 27 mmol/L (ref 20–32)
Calcium: 9.7 mg/dL (ref 8.6–10.4)
Chloride: 101 mmol/L (ref 98–110)
Creat: 0.81 mg/dL (ref 0.50–1.05)
GFR, Est African American: 96 mL/min/{1.73_m2} (ref >=60)
GFR, Est Non African American: 83 mL/min/{1.73_m2} (ref >=60)
Globulin: 2.8 g/dL (calc) (ref 1.9–3.7)
Glucose, Bld: 161 mg/dL — ABNORMAL HIGH (ref 65–99)
Potassium: 4 mmol/L (ref 3.5–5.3)
Sodium: 138 mmol/L (ref 135–146)
Total Bilirubin: 0.5 mg/dL (ref 0.2–1.2)
Total Protein: 7.4 g/dL (ref 6.1–8.1)

## 2019-05-13 LAB — HEMOGLOBIN A1C
Hgb A1c MFr Bld: 7.3 % of total Hgb — ABNORMAL HIGH (ref <5.7)
Mean Plasma Glucose: 163 (calc)
eAG (mmol/L): 9 (calc)

## 2019-05-13 LAB — LIPID PANEL
Cholesterol: 213 mg/dL — ABNORMAL HIGH (ref <200)
HDL: 32 mg/dL — ABNORMAL LOW (ref >=50)
Non-HDL Cholesterol (Calc): 181 mg/dL (calc) — ABNORMAL HIGH (ref <130)
Total CHOL/HDL Ratio: 6.7 (calc) — ABNORMAL HIGH (ref <5.0)
Triglycerides: 553 mg/dL — ABNORMAL HIGH (ref <150)

## 2019-05-13 LAB — CBC WITH DIFFERENTIAL/PLATELET
Absolute Monocytes: 583 cells/uL (ref 200–950)
Basophils Absolute: 41 cells/uL (ref 0–200)
Basophils Relative: 0.5 %
Eosinophils Absolute: 162 cells/uL (ref 15–500)
Eosinophils Relative: 2 %
HCT: 42.9 % (ref 35.0–45.0)
Hemoglobin: 14.3 g/dL (ref 11.7–15.5)
Lymphs Abs: 2592 cells/uL (ref 850–3900)
MCH: 28.8 pg (ref 27.0–33.0)
MCHC: 33.3 g/dL (ref 32.0–36.0)
MCV: 86.5 fL (ref 80.0–100.0)
MPV: 10.2 fL (ref 7.5–12.5)
Monocytes Relative: 7.2 %
Neutro Abs: 4722 cells/uL (ref 1500–7800)
Neutrophils Relative %: 58.3 %
Platelets: 270 10*3/uL (ref 140–400)
RBC: 4.96 10*6/uL (ref 3.80–5.10)
RDW: 13.1 % (ref 11.0–15.0)
Total Lymphocyte: 32 %
WBC: 8.1 10*3/uL (ref 3.8–10.8)

## 2019-05-13 LAB — MAGNESIUM: Magnesium: 2.2 mg/dL (ref 1.5–2.5)

## 2019-05-13 LAB — INSULIN, RANDOM: Insulin: 21.3 u[IU]/mL — ABNORMAL HIGH

## 2019-05-13 LAB — VITAMIN D 25 HYDROXY (VIT D DEFICIENCY, FRACTURES): Vit D, 25-Hydroxy: 18 ng/mL — ABNORMAL LOW (ref 30–100)

## 2019-05-13 LAB — TSH: TSH: 1.96 mIU/L

## 2019-05-13 MED ORDER — ROSUVASTATIN CALCIUM 20 MG PO TABS: ORAL_TABLET | ORAL | 1 refills | Status: DC

## 2019-05-14 ENCOUNTER — Telehealth: Payer: Self-pay | Admitting: *Deleted

## 2019-05-14 NOTE — Telephone Encounter (Signed)
Patient called and asked when she should take he new BP medication, Losartan 100 mg.  Per dr Melford Aase, continue the Atenolol and HCTZ in the morning and take the Losartan at bedtime. Patient is concerned about the medication lowering her BP too much. Per Dr Melford Aase, if the systolic goes below 715, she can reduce the the Losartan 100 mg to 1/2 tablet. Patient is aware.

## 2019-05-26 ENCOUNTER — Other Ambulatory Visit: Payer: Self-pay

## 2019-05-26 ENCOUNTER — Ambulatory Visit
Admission: RE | Admit: 2019-05-26 | Discharge: 2019-05-26 | Disposition: A | Discharge status: Home / Self Care | Payer: BC Managed Care – PPO | Source: Ambulatory Visit | Attending: Women's Health | Admitting: Women's Health

## 2019-05-26 DIAGNOSIS — Z1231 Encounter for screening mammogram for malignant neoplasm of breast: Secondary | ICD-10-CM | POA: Diagnosis not present

## 2019-05-27 ENCOUNTER — Telehealth: Payer: Self-pay | Admitting: *Deleted

## 2019-05-27 NOTE — Telephone Encounter (Signed)
Patient called and asked if there is any interaction between Losartan and Fenofibrate. She takes Fenofibrate in the morning and has not started the Losartan, which she will take at bedtime.Per Dr Oneta Rack, it is OK to take both of these medications. Patient also requested to start the Losartan at 1/2 tablet at bedtime. Dr Oneta Rack agreed. Patient is aware.

## 2019-08-11 NOTE — Progress Notes (Signed)
FOLLOW UP  Assessment and Plan:   Essential Hypertension Losartan 110mg , half tablet and HCTZ 25mg  Didn't tolerate olmesartan, lisinopril;  Monitor blood pressure at home; patient to call if consistently greater than 130/80 Continue DASH diet.   Reminder to go to the ER if any CP, SOB, nausea, dizziness, severe HA, changes vision/speech, left arm numbness and tingling and jaw pain.  Hyperlipademia with DMII (HCC) Currently above goal; emphasized need for diet adherence, continue fenofibrate- take daily,  Hesitency to add statin from last OV, discussed at length Agreeable to take 3 days a week. LDL goal <70 for T2DM, recommended statin; consider switching from fenofibrate if trigs significantly improved with glucose control  Continue low cholesterol diet and exercise.  Check lipid panel.   DMII, w/o long term use of insulin. Metformin 500 mg TID with meals - admits to poor diet over the last month  Continue diet and exercise.  Perform daily foot/skin check, notify office of any concerning changes.  Check A1C  BMI 29.0-29.9 Long discussion about weight loss, diet, and exercise Recommended diet heavy in fruits and veggies and low in animal meats, cheeses, and dairy products, appropriate calorie intake Discussed ideal weight for height  Will follow up in 3 months  Hypothyroidism continue medications the same pending lab results reminded to take on an empty stomach 30-26mins before food.  check TSH level Continue Levothyroxine one tablet daily.  Vitamin D Def Below goal at last visit; she has not increased her dosing as recommended. Taking 5,000IU every 3 days? Encouraged daily adherence, discussed prescription to boost level, hesitantcy with this. goal of 60-100 defer Vit D level  Medication management Continued    Continue diet and meds as discussed. Further disposition pending results of labs. Discussed med's effects and SE's.   Over 30 minutes of exam, counseling,  chart review, and critical decision making was performed.   Future Appointments  Date Time Provider Department Center  11/10/2019  9:00 AM 72m, MD [facility] None    ----------------------------------------------------------------------------------------------------------------------  HPI 54 y.o. female  presents for 3 month follow up on HTN, HLD, DMII, weight, hypothyroidism and vitamin D deficiency.   Her last OV was 04/2019.  Her triglycerides were in the 500's.  It was recommended that she take Rosuvastatin for cholesterol reduction.  She was hesitant to do so related to side effects  Profile she has read.  Discussed importance of reducing this.  She has DMII, HTN and HLD greatly increasing risk of heart attack 7 stroke.  Discussed at length adverse events related to uncontrolled cholesterol.  BMI is Body mass index is 29.08 kg/m., she has been working on diet and exercise.  Wt Readings from Last 3 Encounters:  08/12/19 159 lb (72.1 kg)  05/12/19 160 lb 12.8 oz (72.9 kg)  04/01/19 161 lb 12.8 oz (73.4 kg)   Taking ARB, d/c bASA. Her blood pressure has not been controlled at home, today their BP is BP: 138/88  She does workout. She denies chest pain, shortness of breath, dizziness.   She is on cholesterol medication (fenofibrate- currently taking daily, not on statin and very resistant to adding) and denies myalgias. Her cholesterol is not at goal. The cholesterol last visit was:   Lab Results  Component Value Date   CHOL 213 (H) 05/12/2019   HDL 32 (L) 05/12/2019   LDLCALC  05/12/2019     Comment:     . LDL cholesterol not calculated. Triglyceride levels greater than 400 mg/dL invalidate calculated LDL  results. . Reference range: <100 . Desirable range <100 mg/dL for primary prevention;   <70 mg/dL for patients with CHD or diabetic patients  with > or = 2 CHD risk factors. Marland Kitchen LDL-C is now calculated using the Martin-Hopkins  calculation, which is a validated  novel method providing  better accuracy than the Friedewald equation in the  estimation of LDL-C.  Cresenciano Genre et al. Annamaria Helling. 9147;829(56): 2061-2068  (http://education.[employer].com/faq/FAQ164)    TRIG 553 (H) 05/12/2019   CHOLHDL 6.7 (H) 05/12/2019    She has been working on diet and exercise for T2DM with last A1C 7.3% in 04/2019. Currently taking metformin 500 mg BID  She forgot glucose log but reports has ranged 130-150.  She denies foot ulcerations, increased appetite, nausea, paresthesia of the feet, polydipsia, polyuria, visual disturbances, vomiting and weight loss. Last A1C in the office was:  Lab Results  Component Value Date   HGBA1C 7.3 (H) 05/12/2019   Lab Results  Component Value Date   GFRNONAA 83 05/12/2019   She is on thyroid medication. Her medication was not changed last visit.  Taking 112 mcg, whole tablet daily. Lab Results  Component Value Date   TSH 1.96 05/12/2019  . Patient is not on Vitamin D supplement despite recommendation Lab Results  Component Value Date   VD25OH 66 (L) 05/12/2019        Current Medications:  Current Outpatient Medications on File Prior to Visit  Medication Sig  . atenolol (TENORMIN) 100 MG tablet Take 1 tablet Daily for BP  . fenofibrate micronized (LOFIBRA) 134 MG capsule Take 1 capsule Daily for Triglycerides (Blood Fats)  . Glucose Blood (BLOOD GLUCOSE TEST STRIPS) STRP Test blood sugar once daily  . hydrochlorothiazide (HYDRODIURIL) 25 MG tablet TAKE 1 TABLET BY MOUTH EVERY DAY  . Lancets MISC Test blood sugar once daily  . levothyroxine (SYNTHROID) 112 MCG tablet Take 1 tablet daily on an empty stomach with only water for 30 minutes & no Antacid meds, Calcium or Magnesium for 4 hours & avoid Biotin  . losartan (COZAAR) 100 MG tablet Take 1 tablet Daily for BP & Diabetic Kidney Protection (Patient taking differently: Take 1/2 tablet Daily for BP & Diabetic Kidney Protection)  . metFORMIN (GLUCOPHAGE-XR) 500 MG 24 hr  tablet Take 2 tablets 2 x/ day with a meal for Diabetes (Patient taking differently: Takes one tablet three times a day.)  . rosuvastatin (CRESTOR) 20 MG tablet Take 1 tablet daily for Cholesterol (Patient not taking: Reported on 08/12/2019)   No current facility-administered medications on file prior to visit.     Allergies:  Allergies  Allergen Reactions  . Codeine   . Ppd [Tuberculin Purified Protein Derivative]     Positive reaction 2001  . Lisinopril     Dry cough     Medical History:  Past Medical History:  Diagnosis Date  . Hypertension   . Hypothyroidism   . Splenomegaly 02/14/2019   02/14/2019 abd [place]: Spleen measures 14.1 x 15.6 x 4.7 cm with a measured splenic volume of 542 cubic cm. No focal splenic lesions are evident.   Family history- Reviewed and unchanged Social history- Reviewed and unchanged   Review of Systems:  Review of Systems  Constitutional: Negative for malaise/fatigue and weight loss.  HENT: Negative for hearing loss and tinnitus.   Eyes: Negative for blurred vision and double vision.  Respiratory: Negative for cough, shortness of breath and wheezing.   Cardiovascular: Negative for chest pain, palpitations, orthopnea, claudication and leg  swelling.  Gastrointestinal: Negative for abdominal pain, blood in stool, constipation, diarrhea, heartburn, melena, nausea and vomiting.  Genitourinary: Negative.   Musculoskeletal: Negative for joint pain and myalgias.  Skin: Negative for rash.  Neurological: Negative for dizziness, tingling, sensory change, weakness and headaches.  Endo/Heme/Allergies: Negative for polydipsia.  Psychiatric/Behavioral: Negative.   All other systems reviewed and are negative.     Physical Exam: BP 138/88   Pulse 65   Temp 97.9 F (36.6 C)   Ht 5\' 2"  (1.575 m)   Wt 159 lb (72.1 kg)   SpO2 99%   BMI 29.08 kg/m  Wt Readings from Last 3 Encounters:  08/12/19 159 lb (72.1 kg)  05/12/19 160 lb 12.8 oz (72.9 kg)  04/01/19  161 lb 12.8 oz (73.4 kg)   General Appearance: Well nourished, in no apparent distress. Eyes: PERRLA, EOMs, conjunctiva no swelling or erythema ENT: Ext aud canals clear, TMs without erythema, bulging.  Hearing normal.  Respiratory: Respiratory effort normal, BS equal bilaterally without rales, rhonchi, wheezing or stridor.  Cardio: RRR with no MRGs. Brisk peripheral pulses without edema.  Abdomen: Soft, + BS.  Non tender, no guarding, rebound, hernias, masses. Lymphatics: Non tender without lymphadenopathy.  Musculoskeletal: Full ROM, 5/5 strength, Normal gait Skin: Warm, dry without rashes, lesions, ecchymosis.  Neuro: Cranial nerves intact. No cerebellar symptoms.  Psych: Awake and oriented X 3, normal affect, Insight and Judgment appropriate.    04/03/19, NP 9:42 AM [facility]

## 2019-08-12 ENCOUNTER — Ambulatory Visit (INDEPENDENT_AMBULATORY_CARE_PROVIDER_SITE_OTHER): Payer: BC Managed Care – PPO | Admitting: Adult Health Nurse Practitioner

## 2019-08-12 ENCOUNTER — Encounter: Payer: Self-pay | Admitting: Adult Health Nurse Practitioner

## 2019-08-12 ENCOUNTER — Other Ambulatory Visit: Payer: Self-pay

## 2019-08-12 VITALS — BP 138/88 | HR 65 | Temp 97.9°F | Ht 62.0 in | Wt 159.0 lb

## 2019-08-12 DIAGNOSIS — E559 Vitamin D deficiency, unspecified: Secondary | ICD-10-CM

## 2019-08-12 DIAGNOSIS — Z79899 Other long term (current) drug therapy: Secondary | ICD-10-CM

## 2019-08-12 DIAGNOSIS — E039 Hypothyroidism, unspecified: Secondary | ICD-10-CM | POA: Diagnosis not present

## 2019-08-12 DIAGNOSIS — E1169 Type 2 diabetes mellitus with other specified complication: Secondary | ICD-10-CM

## 2019-08-12 DIAGNOSIS — E1122 Type 2 diabetes mellitus with diabetic chronic kidney disease: Secondary | ICD-10-CM

## 2019-08-12 DIAGNOSIS — E785 Hyperlipidemia, unspecified: Secondary | ICD-10-CM | POA: Diagnosis not present

## 2019-08-12 DIAGNOSIS — I10 Essential (primary) hypertension: Secondary | ICD-10-CM

## 2019-08-12 DIAGNOSIS — N182 Chronic kidney disease, stage 2 (mild): Secondary | ICD-10-CM

## 2019-08-12 DIAGNOSIS — Z6829 Body mass index (BMI) 29.0-29.9, adult: Secondary | ICD-10-CM

## 2019-08-12 NOTE — Patient Instructions (Addendum)
Try Melatonin, you can take 63m 30-45 min prio to going to sleep.  Red Yeast Rice is a supplement to take daily to help lower triglycerides.  Rosuvastatin (Crestor)  Take one tablet three days a week.  We will re-check in three months to see the effect on your cholesterol.  Fenofibrate and statins work better together.  If you have any adverse side effects please let [place].  Continue taking the Zetia for your triglycerides.  If this level gets too high this leads to pancreatitis and hospitalization.  If your cholesterol remains elevated this increases your risk of heart attack and stroke.        Vit D  & Vit C 1,000 mg   are recommended to help protect  against the Covid-19 and other Corona viruses.    Also it's recommended  to take  Zinc 50 mg  to help  protect against the Covid-19   and best place to get  is also on [employer]  and don't pay more than 6-8 cents /pill !  ================================ Coronavirus (COVID-19) Are you at risk?  Are you at risk for the Coronavirus (COVID-19)?  To be considered HIGH RISK for Coronavirus (COVID-19), you have to meet the following criteria:  . [place] to [place] or in [place] or [place] and have fever, cough, and shortness of breath within the last 2 weeks of travel OR . Been in close contact with a person diagnosed with COVID-19 within the last 2 weeks and have  . fever, cough,and shortness of breath .  . IF YOU DO NOT MEET THESE CRITERIA, YOU ARE CONSIDERED LOW RISK FOR COVID-19.  What to do if you are HIGH RISK for COVID-19?  .Marland KitchenIf you are having a medical emergency, call 911. . Seek medical care right away. Before you go to a doctor's office, urgent care or emergency department, .  call ahead and tell them about your recent travel, contact with someone diagnosed with COVID-19  .  and your symptoms.  . You should receive  instructions from your physician's office regarding next steps of care.  . When you arrive at healthcare provider, tell the healthcare staff immediately you have returned from  . visiting [place] or traveled in [place] the last two [place] or you have been in close contact with a person diagnosed with  . COVID-19 in the last 2 weeks.   . Tell the health care staff about your symptoms: fever, cough and shortness of breath. . After you have been seen by a medical provider, you will be either: o Tested for (COVID-19) and discharged home on quarantine except to seek medical care if  o symptoms worsen, and asked to  - Stay home and avoid contact with others until you get your results (4-5 days)  - Avoid travel on public transportation if possible (such as bus, train, or airplane) or o Sent to the Emergency Department by EMS for evaluation, COVID-19 testing  and  o possible admission depending on your condition and test results.  What to do if you are LOW RISK for COVID-19?  Reduce your risk of any infection by using the same precautions used for avoiding the common cold or flu:  .Marland KitchenWash your hands often with soap and warm water for at  least 20 seconds.  If soap and water are not readily available,  . use an alcohol-based hand sanitizer with at least 60% alcohol.  . If coughing or sneezing, cover your mouth and nose by coughing or sneezing into the elbow areas of your shirt or coat, .  into a tissue or into your sleeve (not your hands). . Avoid shaking hands with others and consider head nods or verbal greetings only. . Avoid touching your eyes, nose, or mouth with unwashed hands.  . Avoid close contact with people who are sick. . Avoid places or events with large numbers of people in one location, like concerts or sporting events. . Carefully consider travel plans you have or are making. . If you  are planning any travel outside or inside the [place], visit the CDC's Travelers' Health webpage for the latest health notices. . If you have some symptoms but not all symptoms, continue to monitor at home and seek medical attention  . if your symptoms worsen. . If you are having a medical emergency, call 911. >>>>>>>>>>>>>>>>>>>>>>> Preventive Care for Adults  A healthy lifestyle and preventive care can promote health and wellness. Preventive health guidelines for women include the following key practices.  A routine yearly physical is a good way to check with your health care provider about your health and preventive screening. It is a chance to share any concerns and updates on your health and to receive a thorough exam.  Visit your dentist for a routine exam and preventive care every 6 months. Brush your teeth twice a day and floss once a day. Good oral hygiene prevents tooth decay and gum disease.  The frequency of eye exams is based on your age, health, family medical history, use of contact lenses, and other factors. Follow your health care provider's recommendations for frequency of eye exams.  Eat a healthy diet. Foods like vegetables, fruits, whole grains, low-fat dairy products, and lean protein foods contain the nutrients you need without too many calories. Decrease your intake of foods high in solid fats, added sugars, and salt. Eat the right amount of calories for you. Get information about a proper diet from your health care provider, if necessary.  Regular physical exercise is one of the most important things you can do for your health. Most adults should get at least 150 minutes of moderate-intensity exercise (any activity that increases your heart rate and causes you to sweat) each week. In addition, most adults need muscle-strengthening exercises on 2 or more days a week.  Maintain a healthy weight. The body mass index (BMI) is a screening tool to identify possible weight problems.  It provides an estimate of body fat based on height and weight. Your health care provider can find your BMI and can help you achieve or maintain a healthy weight. For adults 20 years and older:  A BMI below 18.5 is considered underweight.  A BMI of 18.5 to 24.9 is normal.  A BMI of 25 to 29.9 is considered overweight.  A BMI of 30 and above is considered obese.  Maintain normal blood lipids and cholesterol levels by exercising and minimizing your intake of saturated fat. Eat a balanced diet with plenty of fruit and vegetables. Blood tests for lipids and cholesterol should begin at age 32 and be repeated every 5 years. If your lipid or cholesterol levels are high, you are over 50, or you are at high risk for heart disease, you may need your cholesterol levels checked more  frequently. Ongoing high lipid and cholesterol levels should be treated with medicines if diet and exercise are not working.  If you smoke, find out from your health care provider how to quit. If you do not use tobacco, do not start.  Lung cancer screening is recommended for adults aged 68-80 years who are at high risk for developing lung cancer because of a history of smoking. A yearly low-dose CT scan of the lungs is recommended for people who have at least a 30-pack-year history of smoking and are a current smoker or have quit within the past 15 years. A pack year of smoking is smoking an average of 1 pack of cigarettes a day for 1 year (for example: 1 pack a day for 30 years or 2 packs a day for 15 years). Yearly screening should continue until the smoker has stopped smoking for at least 15 years. Yearly screening should be stopped for people who develop a health problem that would prevent them from having lung cancer treatment.  High blood pressure causes heart disease and increases the risk of stroke. Your blood pressure should be checked at least every 1 to 2 years. Ongoing high blood pressure should be treated with medicines  if weight loss and exercise do not work.  If you are 2-78 years old, ask your health care provider if you should take aspirin to prevent strokes.  Diabetes screening involves taking a blood sample to check your fasting blood sugar level. This should be done once every 3 years, after age 77, if you are within normal weight and without risk factors for diabetes. Testing should be considered at a younger age or be carried out more frequently if you are overweight and have at least 1 risk factor for diabetes.  Breast cancer screening is essential preventive care for women. You should practice "breast self-awareness." This means understanding the normal appearance and feel of your breasts and may include breast self-examination. Any changes detected, no matter how small, should be reported to a health care provider. Women in their 23s and 30s should have a clinical breast exam (CBE) by a health care provider as part of a regular health exam every 1 to 3 years. After age 61, women should have a CBE every year. Starting at age 4, women should consider having a mammogram (breast X-ray test) every year. Women who have a family history of breast cancer should talk to their health care provider about genetic screening. Women at a high risk of breast cancer should talk to their health care providers about having an MRI and a mammogram every year.  Breast cancer gene (BRCA)-related cancer risk assessment is recommended for women who have family members with BRCA-related cancers. BRCA-related cancers include breast, ovarian, tubal, and peritoneal cancers. Having family members with these cancers may be associated with an increased risk for harmful changes (mutations) in the breast cancer genes BRCA1 and BRCA2. Results of the assessment will determine the need for genetic counseling and BRCA1 and BRCA2 testing.  Routine pelvic exams to screen for cancer are no longer recommended for nonpregnant women who are considered  low risk for cancer of the pelvic organs (ovaries, uterus, and vagina) and who do not have symptoms. Ask your health care provider if a screening pelvic exam is right for you.  If you have had past treatment for cervical cancer or a condition that could lead to cancer, you need Pap tests and screening for cancer for at least 20 years after your  treatment. If Pap tests have been discontinued, your risk factors (such as having a new sexual partner) need to be reassessed to determine if screening should be resumed. Some women have medical problems that increase the chance of getting cervical cancer. In these cases, your health care provider may recommend more frequent screening and Pap tests.  Colorectal cancer can be detected and often prevented. Most routine colorectal cancer screening begins at the age of 34 years and continues through age 74 years. However, your health care provider may recommend screening at an earlier age if you have risk factors for colon cancer. On a yearly basis, your health care provider may provide home test kits to check for hidden blood in the stool. Use of a small camera at the end of a tube, to directly examine the colon (sigmoidoscopy or colonoscopy), can detect the earliest forms of colorectal cancer. Talk to your health care provider about this at age 57, when routine screening begins.  Direct exam of the colon should be repeated every 5-10 years through age 39 years, unless early forms of pre-cancerous polyps or small growths are found.  Hepatitis C blood testing is recommended for all people born from [birth year] through 1965 and any individual with known risks for hepatitis C.  Pra  Osteoporosis is a disease in which the bones lose minerals and strength with aging. This can result in serious bone fractures or breaks. The risk of osteoporosis can be identified using a bone density scan. Women ages 31 years and over and women at risk for fractures or osteoporosis should discuss  screening with their health care providers. Ask your health care provider whether you should take a calcium supplement or vitamin D to reduce the rate of osteoporosis.  Menopause can be associated with physical symptoms and risks. Hormone replacement therapy is available to decrease symptoms and risks. You should talk to your health care provider about whether hormone replacement therapy is right for you.  Use sunscreen. Apply sunscreen liberally and repeatedly throughout the day. You should seek shade when your shadow is shorter than you. Protect yourself by wearing long sleeves, pants, a wide-brimmed hat, and sunglasses year round, whenever you are outdoors.  Once a month, do a whole body skin exam, using a mirror to look at the skin on your back. Tell your health care provider of new moles, moles that have irregular borders, moles that are larger than a pencil eraser, or moles that have changed in shape or color.  Stay current with required vaccines (immunizations).  Influenza vaccine. All adults should be immunized every year.  Tetanus, diphtheria, and acellular pertussis (Td, Tdap) vaccine. Pregnant women should receive 1 dose of Tdap vaccine during each pregnancy. The dose should be obtained regardless of the length of time since the last dose. Immunization is preferred during the 27th-36th week of gestation. An adult who has not previously received Tdap or who does not know her vaccine status should receive 1 dose of Tdap. This initial dose should be followed by tetanus and diphtheria toxoids (Td) booster doses every 10 years. Adults with an unknown or incomplete history of completing a 3-dose immunization series with Td-containing vaccines should begin or complete a primary immunization series including a Tdap dose. Adults should receive a Td booster every 10 years.  Varicella vaccine. An adult without evidence of immunity to varicella should receive 2 doses or a second dose if she has  previously received 1 dose. Pregnant females who do not have evidence of  immunity should receive the first dose after pregnancy. This first dose should be obtained before leaving the health care facility. The second dose should be obtained 4-8 weeks after the first dose.  Human papillomavirus (HPV) vaccine. Females aged 13-26 years who have not received the vaccine previously should obtain the 3-dose series. The vaccine is not recommended for use in pregnant females. However, pregnancy testing is not needed before receiving a dose. If a female is found to be pregnant after receiving a dose, no treatment is needed. In that case, the remaining doses should be delayed until after the pregnancy. Immunization is recommended for any person with an immunocompromised condition through the age of 1 years if she did not get any or all doses earlier. During the 3-dose series, the second dose should be obtained 4-8 weeks after the first dose. The third dose should be obtained 24 weeks after the first dose and 16 weeks after the second dose.  Zoster vaccine. One dose is recommended for adults aged 12 years or older unless certain conditions are present.  Measles, mumps, and rubella (MMR) vaccine. Adults born before [birth year] generally are considered immune to measles and mumps. Adults born in [birth year] or later should have 1 or more doses of MMR vaccine unless there is a contraindication to the vaccine or there is laboratory evidence of immunity to each of the three diseases. A routine second dose of MMR vaccine should be obtained at least 28 days after the first dose for students attending postsecondary schools, health care workers, or international travelers. People who received inactivated measles vaccine or an unknown type of measles vaccine during 1963-1967 should receive 2 doses of MMR vaccine. People who received inactivated mumps vaccine or an unknown type of mumps vaccine before 1979 and are at high risk for mumps  infection should consider immunization with 2 doses of MMR vaccine. For females of childbearing age, rubella immunity should be determined. If there is no evidence of immunity, females who are not pregnant should be vaccinated. If there is no evidence of immunity, females who are pregnant should delay immunization until after pregnancy. Unvaccinated health care workers born before [birth year] who lack laboratory evidence of measles, mumps, or rubella immunity or laboratory confirmation of disease should consider measles and mumps immunization with 2 doses of MMR vaccine or rubella immunization with 1 dose of MMR vaccine.  Pneumococcal 13-valent conjugate (PCV13) vaccine. When indicated, a person who is uncertain of her immunization history and has no record of immunization should receive the PCV13 vaccine. An adult aged 71 years or older who has certain medical conditions and has not been previously immunized should receive 1 dose of PCV13 vaccine. This PCV13 should be followed with a dose of pneumococcal polysaccharide (PPSV23) vaccine. The PPSV23 vaccine dose should be obtained at least 1 or more year(s) after the dose of PCV13 vaccine. An adult aged 22 years or older who has certain medical conditions and previously received 1 or more doses of PPSV23 vaccine should receive 1 dose of PCV13. The PCV13 vaccine dose should be obtained 1 or more years after the last PPSV23 vaccine dose.    Pneumococcal polysaccharide (PPSV23) vaccine. When PCV13 is also indicated, PCV13 should be obtained first. All adults aged 18 years and older should be immunized. An adult younger than age 28 years who has certain medical conditions should be immunized. Any person who resides in a nursing home or long-term care facility should be immunized. An adult smoker should be immunized. People  with an immunocompromised condition and certain other conditions should receive both PCV13 and PPSV23 vaccines. People with human immunodeficiency  virus (HIV) infection should be immunized as soon as possible after diagnosis. Immunization during chemotherapy or radiation therapy should be avoided. Routine use of PPSV23 vaccine is not recommended for American Indians, [place] Natives, or people younger than 65 years unless there are medical conditions that require PPSV23 vaccine. When indicated, people who have unknown immunization and have no record of immunization should receive PPSV23 vaccine. One-time revaccination 5 years after the first dose of PPSV23 is recommended for people aged 19-64 years who have chronic kidney failure, nephrotic syndrome, asplenia, or immunocompromised conditions. People who received 1-2 doses of PPSV23 before age 25 years should receive another dose of PPSV23 vaccine at age 32 years or later if at least 5 years have passed since the previous dose. Doses of PPSV23 are not needed for people immunized with PPSV23 at or after age 50 years.  Preventive Services / Frequency   Ages 52 to 52 years  Blood pressure check.  Lipid and cholesterol check.  Lung cancer screening. / Every year if you are aged 56-80 years and have a 30-pack-year history of smoking and currently smoke or have quit within the past 15 years. Yearly screening is stopped once you have quit smoking for at least 15 years or develop a health problem that would prevent you from having lung cancer treatment.  Clinical breast exam.** / Every year after age 86 years.   BRCA-related cancer risk assessment.** / For women who have family members with a BRCA-related cancer (breast, ovarian, tubal, or peritoneal cancers).  Mammogram.** / Every year beginning at age 2 years and continuing for as long as you are in good health. Consult with your health care provider.  Pap test.** / Every 3 years starting at age 22 years through age 5 or 34 years with a history of 3 consecutive normal Pap tests.  HPV screening.** / Every 3 years from ages 48 years through ages  58 to 20 years with a history of 3 consecutive normal Pap tests.  Fecal occult blood test (FOBT) of stool. / Every year beginning at age 23 years and continuing until age 94 years. You may not need to do this test if you get a colonoscopy every 10 years.  Flexible sigmoidoscopy or colonoscopy.** / Every 5 years for a flexible sigmoidoscopy or every 10 years for a colonoscopy beginning at age 75 years and continuing until age 51 years.  Hepatitis C blood test.** / For all people born from [birth year] through 1965 and any individual with known risks for hepatitis C.  Skin self-exam. / Monthly.  Influenza vaccine. / Every year.  Tetanus, diphtheria, and acellular pertussis (Tdap/Td) vaccine.** / Consult your health care provider. Pregnant women should receive 1 dose of Tdap vaccine during each pregnancy. 1 dose of Td every 10 years.  Varicella vaccine.** / Consult your health care provider. Pregnant females who do not have evidence of immunity should receive the first dose after pregnancy.  Zoster vaccine.** / 1 dose for adults aged 52 years or older.  Pneumococcal 13-valent conjugate (PCV13) vaccine.** / Consult your health care provider.  Pneumococcal polysaccharide (PPSV23) vaccine.** / 1 to 2 doses if you smoke cigarettes or if you have certain conditions.  Meningococcal vaccine.** / Consult your health care provider.  Hepatitis A vaccine.** / Consult your health care provider.  Hepatitis B vaccine.** / Consult your health care provider. Screening for abdominal aortic  aneurysm (AAA)  by ultrasound is recommended for people over 50 who have history of high blood pressure or who are current or former smokers. ++++++++++++++++++ Recommend Adult Low Dose Aspirin or  coated  Aspirin 81 mg daily  To reduce risk of Colon Cancer 40 %,  Skin Cancer 26 % ,  Melanoma 46%  and  Pancreatic cancer 60% +++++++++++++++++++ Vitamin D goal  is between 70-100.  Please make sure that you are taking your  Vitamin D as directed.  It is very important as a natural anti-inflammatory  helping hair, skin, and nails, as well as reducing stroke and heart attack risk.  It helps your bones and helps with mood. It also decreases numerous cancer risks so please take it as directed.  Low Vit D is associated with a 200-300% higher risk for CANCER  and 200-300% higher risk for HEART   ATTACK  &  STROKE.   .....................................Marland Kitchen It is also associated with higher death rate at younger ages,  autoimmune diseases like Rheumatoid arthritis, Lupus, Multiple Sclerosis.    Also many other serious conditions, like depression, Alzheimer's Dementia, infertility, muscle aches, fatigue, fibromyalgia - just to name a few. ++++++++++++++++++ Recommend the book "The END of DIETING" by Dr Excell Seltzer  & the book "The END of DIABETES " by Dr Excell Seltzer At [email or web address] - get book & Audio CD's    Being diabetic has a  300% increased risk for heart attack, stroke, cancer, and alzheimer- type vascular dementia. It is very important that you work harder with diet by avoiding all foods that are white. Avoid white rice (brown & wild rice is OK), white potatoes (sweetpotatoes in moderation is OK), White bread or wheat bread or anything made out of white flour like bagels, donuts, rolls, buns, biscuits, cakes, pastries, cookies, pizza crust, and pasta (made from white flour & egg whites) - vegetarian pasta or spinach or wheat pasta is OK. Multigrain breads like Arnold's or Pepperidge [employer], or multigrain sandwich thins or flatbreads.  Diet, exercise and weight loss can reverse and cure diabetes in the early stages.  Diet, exercise and weight loss is very important in the control and prevention of complications of diabetes which affects every system in your body, ie. Brain - dementia/stroke, eyes - glaucoma/blindness, heart - heart attack/heart failure, kidneys - dialysis, stomach - gastric paralysis, intestines -  malabsorption, nerves - severe painful neuritis, circulation - gangrene & loss of a leg(s), and finally cancer and Alzheimers.    I recommend avoid fried & greasy foods,  sweets/candy, white rice (brown or wild rice or Quinoa is OK), white potatoes (sweet potatoes are OK) - anything made from white flour - bagels, doughnuts, rolls, buns, biscuits,white and wheat breads, pizza crust and traditional pasta made of white flour & egg white(vegetarian pasta or spinach or wheat pasta is OK).  Multi-grain bread is OK - like multi-grain flat bread or sandwich thins. Avoid alcohol in excess. Exercise is also important.    Eat all the vegetables you want - avoid meat, especially red meat and dairy - especially cheese.  Cheese is the most concentrated form of trans-fats which is the worst thing to clog up our arteries. Veggie cheese is OK which can be found in the fresh produce section at [employer] or [employer] or [employer]  ++++++++++++++++++++++ DASH Eating Plan  DASH stands for "Dietary Approaches to Stop Hypertension."   The DASH eating plan is a healthy eating plan that has been shown to reduce  high blood pressure (hypertension). Additional health benefits may include reducing the risk of type 2 diabetes mellitus, heart disease, and stroke. The DASH eating plan may also help with weight loss. WHAT DO I NEED TO KNOW ABOUT THE DASH EATING PLAN? For the DASH eating plan, you will follow these general guidelines:  Choose foods with a percent daily value for sodium of less than 5% (as listed on the food label).  Use salt-free seasonings or herbs instead of table salt or sea salt.  Check with your health care provider or pharmacist before using salt substitutes.  Eat lower-sodium products, often labeled as "lower sodium" or "no salt added."  Eat fresh foods.  Eat more vegetables, fruits, and low-fat dairy products.  Choose whole grains. Look for the word "whole" as the first word in the  ingredient list.  Choose fish   Limit sweets, desserts, sugars, and sugary drinks.  Choose heart-healthy fats.  Eat veggie cheese   Eat more home-cooked food and less restaurant, buffet, and fast food.  Limit fried foods.  Cook foods using methods other than frying.  Limit canned vegetables. If you do use them, rinse them well to decrease the sodium.  When eating at a restaurant, ask that your food be prepared with less salt, or no salt if possible.                      WHAT FOODS CAN I EAT? Read Dr Fara Olden Fuhrman's books on The End of Dieting & The End of Diabetes  Grains Whole grain or whole wheat bread. Brown rice. Whole grain or whole wheat pasta. Quinoa, bulgur, and whole grain cereals. Low-sodium cereals. Corn or whole wheat flour tortillas. Whole grain cornbread. Whole grain crackers. Low-sodium crackers.  Vegetables Fresh or frozen vegetables (raw, steamed, roasted, or grilled). Low-sodium or reduced-sodium tomato and vegetable juices. Low-sodium or reduced-sodium tomato sauce and paste. Low-sodium or reduced-sodium canned vegetables.   Fruits All fresh, canned (in natural juice), or frozen fruits.  Protein Products  All fish and seafood.  Dried beans, peas, or lentils. Unsalted nuts and seeds. Unsalted canned beans.  Dairy Low-fat dairy products, such as skim or 1% milk, 2% or reduced-fat cheeses, low-fat ricotta or cottage cheese, or plain low-fat yogurt. Low-sodium or reduced-sodium cheeses.  Fats and Oils Tub margarines without trans fats. Light or reduced-fat mayonnaise and salad dressings (reduced sodium). Avocado. Safflower, olive, or canola oils. Natural peanut or almond butter.  Other Unsalted popcorn and pretzels. The items listed above may not be a complete list of recommended foods or beverages. Contact your dietitian for more options.  ++++++++++++++++++  WHAT FOODS ARE NOT RECOMMENDED? Grains/ White flour or wheat flour White bread. White pasta.  White rice. Refined cornbread. Bagels and croissants. Crackers that contain trans fat.  Vegetables  Creamed or fried vegetables. Vegetables in a . Regular canned vegetables. Regular canned tomato sauce and paste. Regular tomato and vegetable juices.  Fruits Dried fruits. Canned fruit in light or heavy syrup. Fruit juice.  Meat and Other Protein Products Meat in general - RED meat & White meat.  Fatty cuts of meat. Ribs, chicken wings, all processed meats as bacon, sausage, bologna, salami, fatback, hot dogs, bratwurst and packaged luncheon meats.  Dairy Whole or 2% milk, cream, half-and-half, and cream cheese. Whole-fat or sweetened yogurt. Full-fat cheeses or blue cheese. Non-dairy creamers and whipped toppings. Processed cheese, cheese spreads, or cheese curds.  Condiments Onion and garlic salt, seasoned salt, table salt,  and sea salt. Canned and packaged gravies. Worcestershire sauce. Tartar sauce. Barbecue sauce. Teriyaki sauce. Soy sauce, including reduced sodium. Steak sauce. Fish sauce. Oyster sauce. Cocktail sauce. Horseradish. Ketchup and mustard. Meat flavorings and tenderizers. Bouillon cubes. Hot sauce. Tabasco sauce. Marinades. Taco seasonings. Relishes.  Fats and Oils Butter, stick margarine, lard, shortening and bacon fat. Coconut, palm kernel, or palm oils. Regular salad dressings.  Pickles and olives. Salted popcorn and pretzels.  The items listed above may not be a complete list of foods and beverages to avoid.

## 2019-08-13 LAB — CBC WITH DIFFERENTIAL/PLATELET
Absolute Monocytes: 611 cells/uL (ref 200–950)
Basophils Absolute: 43 cells/uL (ref 0–200)
Basophils Relative: 0.5 %
Eosinophils Absolute: 215 cells/uL (ref 15–500)
Eosinophils Relative: 2.5 %
HCT: 42.4 % (ref 35.0–45.0)
Hemoglobin: 14 g/dL (ref 11.7–15.5)
Lymphs Abs: 2382 cells/uL (ref 850–3900)
MCH: 28.7 pg (ref 27.0–33.0)
MCHC: 33 g/dL (ref 32.0–36.0)
MCV: 86.9 fL (ref 80.0–100.0)
MPV: 10.2 fL (ref 7.5–12.5)
Monocytes Relative: 7.1 %
Neutro Abs: 5349 cells/uL (ref 1500–7800)
Neutrophils Relative %: 62.2 %
Platelets: 304 10*3/uL (ref 140–400)
RBC: 4.88 10*6/uL (ref 3.80–5.10)
RDW: 13.2 % (ref 11.0–15.0)
Total Lymphocyte: 27.7 %
WBC: 8.6 10*3/uL (ref 3.8–10.8)

## 2019-08-13 LAB — HEMOGLOBIN A1C
Hgb A1c MFr Bld: 6.8 % of total Hgb — ABNORMAL HIGH (ref <5.7)
Mean Plasma Glucose: 148 (calc)
eAG (mmol/L): 8.2 (calc)

## 2019-08-13 LAB — COMPLETE METABOLIC PANEL WITH GFR
AG Ratio: 1.6 (calc) (ref 1.0–2.5)
ALT: 28 U/L (ref 6–29)
AST: 19 U/L (ref 10–35)
Albumin: 4.7 g/dL (ref 3.6–5.1)
Alkaline phosphatase (APISO): 42 U/L (ref 37–153)
BUN: 13 mg/dL (ref 7–25)
CO2: 30 mmol/L (ref 20–32)
Calcium: 10.2 mg/dL (ref 8.6–10.4)
Chloride: 99 mmol/L (ref 98–110)
Creat: 0.8 mg/dL (ref 0.50–1.05)
GFR, Est African American: 98 mL/min/{1.73_m2} (ref >=60)
GFR, Est Non African American: 84 mL/min/{1.73_m2} (ref >=60)
Globulin: 3 g/dL (calc) (ref 1.9–3.7)
Glucose, Bld: 156 mg/dL — ABNORMAL HIGH (ref 65–99)
Potassium: 4.5 mmol/L (ref 3.5–5.3)
Sodium: 136 mmol/L (ref 135–146)
Total Bilirubin: 0.4 mg/dL (ref 0.2–1.2)
Total Protein: 7.7 g/dL (ref 6.1–8.1)

## 2019-08-13 LAB — LIPID PANEL
Cholesterol: 199 mg/dL (ref <200)
HDL: 33 mg/dL — ABNORMAL LOW (ref >=50)
LDL Cholesterol (Calc): 120 mg/dL (calc) — ABNORMAL HIGH
Non-HDL Cholesterol (Calc): 166 mg/dL (calc) — ABNORMAL HIGH (ref <130)
Total CHOL/HDL Ratio: 6 (calc) — ABNORMAL HIGH (ref <5.0)
Triglycerides: 325 mg/dL — ABNORMAL HIGH (ref <150)

## 2019-08-13 LAB — TSH: TSH: 1.94 mIU/L

## 2019-08-13 LAB — VITAMIN D 25 HYDROXY (VIT D DEFICIENCY, FRACTURES): Vit D, 25-Hydroxy: 21 ng/mL — ABNORMAL LOW (ref 30–100)

## 2019-08-13 LAB — MAGNESIUM: Magnesium: 2.1 mg/dL (ref 1.5–2.5)

## 2019-08-15 ENCOUNTER — Other Ambulatory Visit: Payer: Self-pay | Admitting: Internal Medicine

## 2019-08-15 DIAGNOSIS — I10 Essential (primary) hypertension: Secondary | ICD-10-CM

## 2019-08-21 DIAGNOSIS — D2372 Other benign neoplasm of skin of left lower limb, including hip: Secondary | ICD-10-CM | POA: Diagnosis not present

## 2019-08-21 DIAGNOSIS — B353 Tinea pedis: Secondary | ICD-10-CM | POA: Diagnosis not present

## 2019-08-21 DIAGNOSIS — D225 Melanocytic nevi of trunk: Secondary | ICD-10-CM | POA: Diagnosis not present

## 2019-11-09 ENCOUNTER — Encounter: Payer: Self-pay | Admitting: Internal Medicine

## 2019-11-09 NOTE — Progress Notes (Signed)
Annual Screening/Preventative Visit & Comprehensive Evaluation &  Examination     This very nice 54 y.o.  MWF presents for a Screening /Preventative Visit & comprehensive evaluation and management of multiple medical co-morbidities.  Patient has been followed for HTN, HLD, T2_NIDDM and Vitamin D Deficiency.      HTN predates circa 2000. Patient's BP has been controlled at home and patient denies any cardiac symptoms as chest pain, palpitations, shortness of breath, dizziness or ankle swelling.  Patient admits not monitoring her BP's,  nor taking her medicines regularly and today's BP is elevated at 164/100.      Patient is reticent to taking statins and her hyperlipidemia is not controlled with diet and fenofibrate. Patient denies myalgias or other medication SE's. Last lipids were not at goal:  Lab Results  Component Value Date   CHOL 199 08/12/2019   HDL 33 (L) 08/12/2019   LDLCALC 120 (H) 08/12/2019   TRIG 325 (H) 08/12/2019   CHOLHDL 6.0 (H) 08/12/2019       Patient has hx/o obesity (BMI 29+) and 1st Insulin resistance /Prediabetes since 2015 and then dx'd with  T2_NIDDM CKD2 (GFR 84)  (A1c 6.5% / Feb 2019) and failing diet she was started on Metformin. Patient denies reactive hypoglycemic symptoms, visual blurring, diabetic polys or paresthesias. Last A1c was not at goal:  Lab Results  Component Value Date   HGBA1C 6.8 (H) 08/12/2019                                           Patient was dx'd Hypothyroid in 1997 and has been on  thyroid replacement  since.     Finally, patient has history of Vitamin D Deficiency("20" / 2013&"16" / 2014) and does not supplement as recommended and last Vitamin D was still  not at goal:  Lab Results  Component Value Date   VD25OH 21 (L) 08/12/2019    Current Outpatient Medications on File Prior to Visit  Medication Sig  . atenolol (TENORMIN) 100 MG tablet Take 1 tablet Daily for BP  . fenofibrate micronized (LOFIBRA) 134 MG capsule Take 1  capsule Daily for Triglycerides (Blood Fats)  . Glucose Blood (BLOOD GLUCOSE TEST STRIPS) STRP Test blood sugar once daily  . hydrochlorothiazide (HYDRODIURIL) 25 MG tablet Take 1 tablet every Morning for BP & Fluid Retention / Ankle Swelling  . Lancets MISC Test blood sugar once daily  . levothyroxine (SYNTHROID) 112 MCG tablet Take 1 tablet daily on an empty stomach with only water for 30 minutes & no Antacid meds, Calcium or Magnesium for 4 hours & avoid Biotin  . losartan (COZAAR) 100 MG tablet Take 1 tablet Daily for BP & Diabetic Kidney Protection (Patient taking differently: Take 1/2 tablet Daily for BP & Diabetic Kidney Protection)  . metFORMIN (GLUCOPHAGE-XR) 500 MG 24 hr tablet Take 2 tablets 2 x/ day with a meal for Diabetes (Patient taking differently: Takes one tablet three times a day.)  . rosuvastatin (CRESTOR) 20 MG tablet Take 1 tablet daily for Cholesterol   No current facility-administered medications on file prior to visit.   Allergies  Allergen Reactions  . Codeine   . Ppd [Tuberculin Purified Protein Derivative]     Positive reaction 2001  . Lisinopril     Dry cough   Past Medical History:  Diagnosis Date  . Hypertension   . Hypothyroidism   .  Splenomegaly 02/14/2019   02/14/2019 abd [place]: Spleen measures 14.1 x 15.6 x 4.7 cm with a measured splenic volume of 542 cubic cm. No focal splenic lesions are evident.   Health Maintenance  Topic Date Due  . PNEUMOCOCCAL POLYSACCHARIDE VACCINE AGE 72-64 HIGH RISK  Never done  . HIV Screening  Never done  . COLONOSCOPY  Never done  . OPHTHALMOLOGY EXAM  02/16/2019  . PAP SMEAR-Modifier  03/28/2019  . INFLUENZA VACCINE  12/14/2019  . HEMOGLOBIN A1C  02/12/2020  . MAMMOGRAM  05/25/2020  . FOOT EXAM  11/08/2020  . TETANUS/TDAP  08/01/2022  . COVID-19 Vaccine  Completed  . Hepatitis C Screening  Completed   Immunization History  Administered Date(s) Administered  . Influenza,inj,Quad PF,6+ Mos 04/01/2019  . PFIZER  SARS-COV-2 Vaccination 08/23/2019, 09/13/2019  . Td 06/29/2001, 07/31/2012  . Tdap 07/31/2012    Last Colon - Declines Colonoscopy & Cologard last year. Agrees again to call her insurance company to confirm Cologard coverage.  Last MGM - gets at Gyn office Maryelizabeth Rowan, NP)  Past Surgical History:  Procedure Laterality Date  . LYMPH NODE DISSECTION     NECK- BENIGN   Family History  Problem Relation Age of Onset  . Cancer Mother        NON HODGKINS LYMPHOMA  . Hypertension Mother   . Diabetes Mother   . Hyperlipidemia Mother   . Hypertension Father   . Heart disease Father    Social History   Tobacco Use  . Smoking status: Never Smoker  . Smokeless tobacco: Never Used  Vaping Use  . Vaping Use: Never used  Substance Use Topics  . Alcohol use: Yes    Comment: occ.  . Drug use: No    ROS Constitutional: Denies fever, chills, weight loss/gain, headaches, insomnia,  night sweats, and change in appetite. Does c/o fatigue. Eyes: Denies redness, blurred vision, diplopia, discharge, itchy, watery eyes.  ENT: Denies discharge, congestion, post nasal drip, epistaxis, sore throat, earache, hearing loss, dental pain, Tinnitus, Vertigo, Sinus pain, snoring.  Cardio: Denies chest pain, palpitations, irregular heartbeat, syncope, dyspnea, diaphoresis, orthopnea, PND, claudication, edema Respiratory: denies cough, dyspnea, DOE, pleurisy, hoarseness, laryngitis, wheezing.  Gastrointestinal: Denies dysphagia, heartburn, reflux, water brash, pain, cramps, nausea, vomiting, bloating, diarrhea, constipation, hematemesis, melena, hematochezia, jaundice, hemorrhoids Genitourinary: Denies dysuria, frequency, urgency, nocturia, hesitancy, discharge, hematuria, flank pain Breast: Breast lumps, nipple discharge, bleeding.  Musculoskeletal: Denies arthralgia, myalgia, stiffness, Jt. Swelling, pain, limp, and strain/sprain. Denies falls. Skin: Denies puritis, rash, hives, warts, acne, eczema,  changing in skin lesion Neuro: No weakness, tremor, incoordination, spasms, paresthesia, pain Psychiatric: Denies confusion, memory loss, sensory loss. Denies Depression. Endocrine: Denies change in weight, skin, hair change, nocturia, and paresthesia, diabetic polys, visual blurring, hyper / hypo glycemic episodes.  Heme/Lymph: No excessive bleeding, bruising, enlarged lymph nodes.  Physical Exam  BP (!) 164/100   Pulse 64   Temp (!) 97.2 F (36.2 C)   Resp 16   Ht 5\' 2"  (1.575 m)   Wt 154 lb 11.2 oz (70.2 kg)   BMI 28.30 kg/m   General Appearance: Over nourished  and in no apparent distress.  Eyes: PERRLA, EOMs, conjunctiva no swelling or erythema, normal fundi and vessels. Sinuses: No frontal/maxillary tenderness ENT/Mouth: EACs patent / TMs  nl. Nares clear without erythema, swelling, mucoid exudates. Oral hygiene is good. No erythema, swelling, or exudate. Tongue normal, non-obstructing. Tonsils not swollen or erythematous. Hearing normal.  Neck: Supple, thyroid not palpable. No bruits, nodes  or JVD. Respiratory: Respiratory effort normal.  BS equal and clear bilateral without rales, rhonci, wheezing or stridor. Cardio: Heart sounds are normal with regular rate and rhythm and no murmurs, rubs or gallops. Peripheral pulses are normal and equal bilaterally without edema. No aortic or femoral bruits. Chest: symmetric with normal excursions and percussion. Breasts: Symmetric, without lumps, nipple discharge, retractions, or fibrocystic changes.  Abdomen: Flat, soft with bowel sounds active. Nontender, no guarding, rebound, hernias, masses, or organomegaly.  Lymphatics: Non tender without lymphadenopathy.  Genitourinary:  Musculoskeletal: Full ROM all peripheral extremities, joint stability, 5/5 strength, and normal gait. Skin: Warm and dry without rashes, lesions, cyanosis, clubbing or  ecchymosis.  Neuro: Cranial nerves intact, reflexes equal bilaterally. Normal muscle tone, no  cerebellar symptoms. Sensation intact to touch, vibratory and Monofilament to the toes bilaterally. Pysch: Alert and oriented X 3, normal affect, Insight and Judgment appropriate.   Assessment and Plan  1. Annual Preventative Screening Examination    2. Essential hypertension  - Advised to monitor BP's at least daily and also to increase her Losartan to 1 whole tablet daily.   - EKG 12-Lead - [place], RETROPERITNL ABD,  LTD - Urinalysis, Routine w reflex microscopic - Microalbumin / creatinine urine ratio - CBC with Differential/Platelet - COMPLETE METABOLIC PANEL WITH GFR - Magnesium - TSH  3. Hyperlipidemia associated with type 2 diabetes mellitus ([place])  - EKG 12-Lead - [place], RETROPERITNL ABD,  LTD - Lipid panel - TSH  4. Type 2 diabetes mellitus with stage 2 chronic kidney disease,  without long-term current use of insulin (HCC)  - EKG 12-Lead - [place], RETROPERITNL ABD,  LTD - Urinalysis, Routine w reflex microscopic - Microalbumin / creatinine urine ratio - [facility] DIABETES FOOT EXAM - LOW EXTREMITY NEUR EXAM DOCUM - Hemoglobin A1c - Insulin, random  5. Vitamin D deficiency  - VITAMIN D 25 Hydroxy   6. Hypothyroidism, unspecified type  - TSH  7. Poorly controlled type 2 diabetes mellitus (HCC)  - EKG 12-Lead - [facility] DIABETES FOOT EXAM - LOW EXTREMITY NEUR EXAM DOCUM - Hemoglobin A1c - Insulin, random  8. Obesity (BMI 30.0-34.9)   9. History of positive PPD   10. Screening for colorectal cancer  - POC Hemoccult Bld/Stl  11. Screening for ischemic heart disease  - EKG 12-Lead  12. FHx: heart disease  - EKG 12-Lead - [place], RETROPERITNL ABD,  LTD  13. Screening for AAA (aortic abdominal aneurysm)  - [place], RETROPERITNL ABD,  LTD  14. Fatigue  - Urinalysis, Routine w reflex microscopic - Microalbumin / creatinine urine ratio - Iron,Total/Total Iron Binding Cap - Vitamin B12 - CBC with Differential/Platelet - TSH  15. Medication management  - CBC with  Differential/Platelet - COMPLETE METABOLIC PANEL WITH GFR - Magnesium - Lipid panel - TSH - Hemoglobin A1c - Insulin, random - VITAMIN D 25 Hydroxy         Patient was counseled in prudent diet to achieve/maintain BMI less than 25 for weight control, BP monitoring, regular exercise and medications. Discussed med's effects and SE's. Screening labs and tests as requested with regular follow-up as recommended. Over 40 minutes of exam, counseling, chart review and high complex critical decision making was performed.   Kirtland Bouchard, MD

## 2019-11-09 NOTE — Patient Instructions (Signed)

## 2019-11-10 ENCOUNTER — Ambulatory Visit (INDEPENDENT_AMBULATORY_CARE_PROVIDER_SITE_OTHER): Payer: BC Managed Care – PPO | Admitting: Internal Medicine

## 2019-11-10 ENCOUNTER — Other Ambulatory Visit: Payer: Self-pay

## 2019-11-10 VITALS — BP 164/100 | HR 64 | Temp 97.2°F | Resp 16 | Ht 62.0 in | Wt 154.7 lb

## 2019-11-10 DIAGNOSIS — Z1211 Encounter for screening for malignant neoplasm of colon: Secondary | ICD-10-CM

## 2019-11-10 DIAGNOSIS — E559 Vitamin D deficiency, unspecified: Secondary | ICD-10-CM | POA: Diagnosis not present

## 2019-11-10 DIAGNOSIS — Z0001 Encounter for general adult medical examination with abnormal findings: Secondary | ICD-10-CM

## 2019-11-10 DIAGNOSIS — N182 Chronic kidney disease, stage 2 (mild): Secondary | ICD-10-CM

## 2019-11-10 DIAGNOSIS — E039 Hypothyroidism, unspecified: Secondary | ICD-10-CM

## 2019-11-10 DIAGNOSIS — Z1329 Encounter for screening for other suspected endocrine disorder: Secondary | ICD-10-CM | POA: Diagnosis not present

## 2019-11-10 DIAGNOSIS — E1165 Type 2 diabetes mellitus with hyperglycemia: Secondary | ICD-10-CM

## 2019-11-10 DIAGNOSIS — Z130 Encounter for screening for diseases of the blood and blood-forming organs and certain disorders involving the immune mechanism: Secondary | ICD-10-CM | POA: Diagnosis not present

## 2019-11-10 DIAGNOSIS — Z79899 Other long term (current) drug therapy: Secondary | ICD-10-CM | POA: Diagnosis not present

## 2019-11-10 DIAGNOSIS — R5383 Other fatigue: Secondary | ICD-10-CM

## 2019-11-10 DIAGNOSIS — E1169 Type 2 diabetes mellitus with other specified complication: Secondary | ICD-10-CM

## 2019-11-10 DIAGNOSIS — Z8249 Family history of ischemic heart disease and other diseases of the circulatory system: Secondary | ICD-10-CM | POA: Diagnosis not present

## 2019-11-10 DIAGNOSIS — Z13220 Encounter for screening for lipoid disorders: Secondary | ICD-10-CM

## 2019-11-10 DIAGNOSIS — I10 Essential (primary) hypertension: Secondary | ICD-10-CM

## 2019-11-10 DIAGNOSIS — Z1389 Encounter for screening for other disorder: Secondary | ICD-10-CM | POA: Diagnosis not present

## 2019-11-10 DIAGNOSIS — Z136 Encounter for screening for cardiovascular disorders: Secondary | ICD-10-CM | POA: Diagnosis not present

## 2019-11-10 DIAGNOSIS — Z9289 Personal history of other medical treatment: Secondary | ICD-10-CM

## 2019-11-10 DIAGNOSIS — Z131 Encounter for screening for diabetes mellitus: Secondary | ICD-10-CM | POA: Diagnosis not present

## 2019-11-10 DIAGNOSIS — E669 Obesity, unspecified: Secondary | ICD-10-CM

## 2019-11-10 DIAGNOSIS — Z0000 Encounter for general adult medical examination without abnormal findings: Secondary | ICD-10-CM | POA: Diagnosis not present

## 2019-11-10 DIAGNOSIS — E1122 Type 2 diabetes mellitus with diabetic chronic kidney disease: Secondary | ICD-10-CM

## 2019-11-11 ENCOUNTER — Other Ambulatory Visit: Payer: Self-pay | Admitting: Internal Medicine

## 2019-11-11 DIAGNOSIS — Z1211 Encounter for screening for malignant neoplasm of colon: Secondary | ICD-10-CM

## 2019-11-11 LAB — COMPLETE METABOLIC PANEL WITH GFR
AG Ratio: 1.8 (calc) (ref 1.0–2.5)
ALT: 21 U/L (ref 6–29)
AST: 15 U/L (ref 10–35)
Albumin: 4.8 g/dL (ref 3.6–5.1)
Alkaline phosphatase (APISO): 38 U/L (ref 37–153)
BUN: 12 mg/dL (ref 7–25)
CO2: 28 mmol/L (ref 20–32)
Calcium: 9.8 mg/dL (ref 8.6–10.4)
Chloride: 99 mmol/L (ref 98–110)
Creat: 0.79 mg/dL (ref 0.50–1.05)
GFR, Est African American: 99 mL/min/{1.73_m2} (ref >=60)
GFR, Est Non African American: 85 mL/min/{1.73_m2} (ref >=60)
Globulin: 2.7 g/dL (calc) (ref 1.9–3.7)
Glucose, Bld: 144 mg/dL — ABNORMAL HIGH (ref 65–99)
Potassium: 4.2 mmol/L (ref 3.5–5.3)
Sodium: 136 mmol/L (ref 135–146)
Total Bilirubin: 0.4 mg/dL (ref 0.2–1.2)
Total Protein: 7.5 g/dL (ref 6.1–8.1)

## 2019-11-11 LAB — IRON, TOTAL/TOTAL IRON BINDING CAP
%SAT: 15 % (calc) — ABNORMAL LOW (ref 16–45)
Iron: 61 ug/dL (ref 45–160)
TIBC: 394 mcg/dL (calc) (ref 250–450)

## 2019-11-11 LAB — LIPID PANEL
Cholesterol: 212 mg/dL — ABNORMAL HIGH (ref <200)
HDL: 35 mg/dL — ABNORMAL LOW (ref >=50)
LDL Cholesterol (Calc): 130 mg/dL (calc) — ABNORMAL HIGH
Non-HDL Cholesterol (Calc): 177 mg/dL (calc) — ABNORMAL HIGH (ref <130)
Total CHOL/HDL Ratio: 6.1 (calc) — ABNORMAL HIGH (ref <5.0)
Triglycerides: 329 mg/dL — ABNORMAL HIGH (ref <150)

## 2019-11-11 LAB — CBC WITH DIFFERENTIAL/PLATELET
Absolute Monocytes: 651 cells/uL (ref 200–950)
Basophils Absolute: 44 cells/uL (ref 0–200)
Basophils Relative: 0.5 %
Eosinophils Absolute: 202 cells/uL (ref 15–500)
Eosinophils Relative: 2.3 %
HCT: 40.5 % (ref 35.0–45.0)
Hemoglobin: 13.8 g/dL (ref 11.7–15.5)
Lymphs Abs: 2420 cells/uL (ref 850–3900)
MCH: 29.4 pg (ref 27.0–33.0)
MCHC: 34.1 g/dL (ref 32.0–36.0)
MCV: 86.4 fL (ref 80.0–100.0)
MPV: 9.9 fL (ref 7.5–12.5)
Monocytes Relative: 7.4 %
Neutro Abs: 5482 cells/uL (ref 1500–7800)
Neutrophils Relative %: 62.3 %
Platelets: 279 10*3/uL (ref 140–400)
RBC: 4.69 10*6/uL (ref 3.80–5.10)
RDW: 13.1 % (ref 11.0–15.0)
Total Lymphocyte: 27.5 %
WBC: 8.8 10*3/uL (ref 3.8–10.8)

## 2019-11-11 LAB — VITAMIN D 25 HYDROXY (VIT D DEFICIENCY, FRACTURES): Vit D, 25-Hydroxy: 24 ng/mL — ABNORMAL LOW (ref 30–100)

## 2019-11-11 LAB — HEMOGLOBIN A1C
Hgb A1c MFr Bld: 6.4 % of total Hgb — ABNORMAL HIGH (ref <5.7)
Mean Plasma Glucose: 137 (calc)
eAG (mmol/L): 7.6 (calc)

## 2019-11-11 LAB — MICROALBUMIN / CREATININE URINE RATIO
Creatinine, Urine: 16 mg/dL — ABNORMAL LOW (ref 20–275)
Microalb Creat Ratio: 13 mcg/mg creat (ref <30)
Microalb, Ur: 0.2 mg/dL

## 2019-11-11 LAB — TSH: TSH: 2.07 mIU/L

## 2019-11-11 LAB — VITAMIN B12: Vitamin B-12: 454 pg/mL (ref 200–1100)

## 2019-11-11 LAB — MAGNESIUM: Magnesium: 2.1 mg/dL (ref 1.5–2.5)

## 2019-11-11 LAB — URINALYSIS, ROUTINE W REFLEX MICROSCOPIC
Bilirubin Urine: NEGATIVE
Glucose, UA: NEGATIVE
Hgb urine dipstick: NEGATIVE
Ketones, ur: NEGATIVE
Leukocytes,Ua: NEGATIVE
Nitrite: NEGATIVE
Protein, ur: NEGATIVE
Specific Gravity, Urine: 1.004 (ref 1.001–1.03)
pH: 6.5 (ref 5.0–8.0)

## 2019-11-11 LAB — INSULIN, RANDOM: Insulin: 15 u[IU]/mL

## 2019-11-11 NOTE — Progress Notes (Signed)
==========================================================  -    Iron is low , So please take an OTC iron supplement ==========================================================  -  Vitamin B12 level is Normal  / OK  ==========================================================  -  Total Chol = 212 - still too high  (Ideal or goal is less than 180)   - and  - Bad / Dangerous  LDL Chol = 130 - very high risk for Heart Attack, Stroke & Vascular Dementia         - Please start taking your Crestor to prevent Heart Attack, Stroke and Vascular Dementia ==========================================================  -  Also, Triglycerides (    329  ) or fats in blood are way  too high  (goal is less than 150)    - Recommend avoid fried & greasy foods,  sweets / candy,   - Avoid white rice  (brown or wild rice or Quinoa is OK),   - Avoid white potatoes  (sweet potatoes are OK)   - Avoid anything made from white flour  - bagels, doughnuts, rolls, buns, biscuits, white and   wheat breads, pizza crust and traditional  pasta made of white flour & egg white  - (vegetarian pasta or spinach or wheat pasta is OK).    - Multi-grain bread is OK - like multi-grain flat bread or  sandwich thins.   - Avoid alcohol in excess.   - Exercise is also important. ==========================================================  -  A1c = 6.4% - a little better,  but still too high   - Ideal or goal is is less than 6.0%  ==========================================================  -  Vitamin D = 24 - Extremely & Dangerously Low ! ! !   - Vitamin D goal is between 70-100.   - Please Take Vitamin D 10,000 units  /day   - It is very important as a natural anti-inflammatory and helping the  immune system protect against viral infections, like the Covid-19    helping hair, skin, and nails, as well as reducing stroke and heart attack risk.   - It helps your bones and helps with mood.  - It also decreases  numerous cancer risks so please take it as directed.   - Low Vit D is associated with a 200-300% higher risk for CANCER   and 200-300% higher risk for HEART   ATTACK  &  STROKE.    - It is also associated with higher death rate at younger ages,   autoimmune diseases like Rheumatoid arthritis, Lupus, Multiple Sclerosis.     - Also many other serious conditions, like depression, Alzheimer's  Dementia, infertility, muscle aches, fatigue, fibromyalgia - just to name a few.  ==========================================================  -

## 2019-11-14 ENCOUNTER — Other Ambulatory Visit: Payer: Self-pay | Admitting: Internal Medicine

## 2019-11-14 ENCOUNTER — Other Ambulatory Visit: Payer: Self-pay | Admitting: Adult Health

## 2019-11-14 DIAGNOSIS — E1122 Type 2 diabetes mellitus with diabetic chronic kidney disease: Secondary | ICD-10-CM

## 2019-11-14 DIAGNOSIS — E1165 Type 2 diabetes mellitus with hyperglycemia: Secondary | ICD-10-CM

## 2019-11-14 DIAGNOSIS — I10 Essential (primary) hypertension: Secondary | ICD-10-CM

## 2019-11-14 DIAGNOSIS — N182 Chronic kidney disease, stage 2 (mild): Secondary | ICD-10-CM

## 2019-11-23 DIAGNOSIS — Z1212 Encounter for screening for malignant neoplasm of rectum: Secondary | ICD-10-CM | POA: Diagnosis not present

## 2019-11-23 DIAGNOSIS — Z1211 Encounter for screening for malignant neoplasm of colon: Secondary | ICD-10-CM | POA: Diagnosis not present

## 2019-11-29 LAB — COLOGUARD
COLOGUARD: NEGATIVE
Cologuard: NEGATIVE

## 2019-12-02 ENCOUNTER — Telehealth: Payer: Self-pay | Admitting: *Deleted

## 2019-12-02 ENCOUNTER — Encounter: Payer: Self-pay | Admitting: *Deleted

## 2019-12-02 NOTE — Telephone Encounter (Signed)
Left message to inform patient os negative Cologuard and 3 year follow up.

## 2019-12-08 ENCOUNTER — Other Ambulatory Visit: Payer: Self-pay | Admitting: Adult Health

## 2019-12-08 DIAGNOSIS — E1165 Type 2 diabetes mellitus with hyperglycemia: Secondary | ICD-10-CM

## 2020-02-11 ENCOUNTER — Ambulatory Visit (INDEPENDENT_AMBULATORY_CARE_PROVIDER_SITE_OTHER): Payer: BC Managed Care – PPO | Admitting: Adult Health

## 2020-02-11 ENCOUNTER — Encounter: Payer: Self-pay | Admitting: Adult Health

## 2020-02-11 ENCOUNTER — Other Ambulatory Visit: Payer: Self-pay

## 2020-02-11 VITALS — BP 164/82 | HR 64 | Temp 97.7°F | Wt 158.0 lb

## 2020-02-11 DIAGNOSIS — E1122 Type 2 diabetes mellitus with diabetic chronic kidney disease: Secondary | ICD-10-CM | POA: Diagnosis not present

## 2020-02-11 DIAGNOSIS — E039 Hypothyroidism, unspecified: Secondary | ICD-10-CM | POA: Diagnosis not present

## 2020-02-11 DIAGNOSIS — E785 Hyperlipidemia, unspecified: Secondary | ICD-10-CM | POA: Diagnosis not present

## 2020-02-11 DIAGNOSIS — Z79899 Other long term (current) drug therapy: Secondary | ICD-10-CM

## 2020-02-11 DIAGNOSIS — N182 Chronic kidney disease, stage 2 (mild): Secondary | ICD-10-CM

## 2020-02-11 DIAGNOSIS — I10 Essential (primary) hypertension: Secondary | ICD-10-CM

## 2020-02-11 DIAGNOSIS — Z6828 Body mass index (BMI) 28.0-28.9, adult: Secondary | ICD-10-CM

## 2020-02-11 DIAGNOSIS — E1169 Type 2 diabetes mellitus with other specified complication: Secondary | ICD-10-CM | POA: Diagnosis not present

## 2020-02-11 DIAGNOSIS — E559 Vitamin D deficiency, unspecified: Secondary | ICD-10-CM

## 2020-02-11 NOTE — Patient Instructions (Addendum)
Goals    . Blood Pressure < 130/80    . Exercise 150 min/wk Moderate Activity    . Fasting Blood Glucose <130    . HEMOGLOBIN A1C < 7    . LDL CALC < 70    . Weight (lb) < 145 lb (65.8 kg)       Recommend start rosuvastatin in the evening -  If any muscle aches STOP fenofibrate and restart just rosuvastatin     Can try melatonin 5mg -15 mg at night for sleep, can also do benadryl 25-50mg  at night for sleep.  If this does not help we can try prescription medication.  Also here is some information about good sleep hygiene.      Insomnia Insomnia is frequent trouble falling and/or staying asleep. Insomnia can be a long term problem or a short term problem. Both are common. Insomnia can be a short term problem when the wakefulness is related to a certain stress or worry. Long term insomnia is often related to ongoing stress during waking hours and/or poor sleeping habits. Overtime, sleep deprivation itself can make the problem worse. Every little thing feels more severe because you are overtired and your ability to cope is decreased. CAUSES   Stress, anxiety, and depression.  Poor sleeping habits.  Distractions such as TV in the bedroom.  Naps close to bedtime.  Engaging in emotionally charged conversations before bed.  Technical reading before sleep.  Alcohol and other sedatives. They may make the problem worse. They can hurt normal sleep patterns and normal dream activity.  Stimulants such as caffeine for several hours prior to bedtime.  Pain syndromes and shortness of breath can cause insomnia.  Exercise late at night.  Changing time zones may cause sleeping problems (jet lag). It is sometimes helpful to have someone observe your sleeping patterns. They should look for periods of not breathing during the night (sleep apnea). They should also look to see how long those periods last. If you live alone or observers are uncertain, you can also be observed at a sleep clinic  where your sleep patterns will be professionally monitored. Sleep apnea requires a checkup and treatment. Give your caregivers your medical history. Give your caregivers observations your family has made about your sleep.  SYMPTOMS   Not feeling rested in the morning.  Anxiety and restlessness at bedtime.  Difficulty falling and staying asleep. TREATMENT   Your caregiver may prescribe treatment for an underlying medical disorders. Your caregiver can give advice or help if you are using alcohol or other drugs for self-medication. Treatment of underlying problems will usually eliminate insomnia problems.  Medications can be prescribed for short time use. They are generally not recommended for lengthy use.  Over-the-counter sleep medicines are not recommended for lengthy use. They can be habit forming.  You can promote easier sleeping by making lifestyle changes such as:  Using relaxation techniques that help with breathing and reduce muscle tension.  Exercising earlier in the day.  Changing your diet and the time of your last meal. No night time snacks.  Establish a regular time to go to bed.  Counseling can help with stressful problems and worry.  Soothing music and white noise may be helpful if there are background noises you cannot remove.  Stop tedious detailed work at least one hour before bedtime. HOME CARE INSTRUCTIONS   Keep a diary. Inform your caregiver about your progress. This includes any medication side effects. See your caregiver regularly. Take note of:  Times when you are asleep.  Times when you are awake during the night.  The quality of your sleep.  How you feel the next day. This information will help your caregiver care for you.  Get out of bed if you are still awake after 15 minutes. Read or do some quiet activity. Keep the lights down. Wait until you feel sleepy and go back to bed.  Keep regular sleeping and waking hours. Avoid naps.  Exercise  regularly.  Avoid distractions at bedtime. Distractions include watching television or engaging in any intense or detailed activity like attempting to balance the household checkbook.  Develop a bedtime ritual. Keep a familiar routine of bathing, brushing your teeth, climbing into bed at the same time each night, listening to soothing music. Routines increase the success of falling to sleep faster.  Use relaxation techniques. This can be using breathing and muscle tension release routines. It can also include visualizing peaceful scenes. You can also help control troubling or intruding thoughts by keeping your mind occupied with boring or repetitive thoughts like the old concept of counting sheep. You can make it more creative like imagining planting one beautiful flower after another in your backyard garden.  During your day, work to eliminate stress. When this is not possible use some of the previous suggestions to help reduce the anxiety that accompanies stressful situations. MAKE SURE YOU:   Understand these instructions.  Will watch your condition.  Will get help right away if you are not doing well or get worse. Document Released: 04/28/2000 Document Revised: 07/24/2011 Document Reviewed: 05/29/2007 [facility] Patient Information 2015 [place], [place]. This information is not intended to replace advice given to you by your health care provider. Make sure you discuss any questions you have with your health care provider.

## 2020-02-11 NOTE — Progress Notes (Signed)
FOLLOW UP  Assessment and Plan:   Hypertension Taking losartan 1/2 tab only; increase to whole tab Hx of intolerance with olmesartan, lisinopril  Continue atenolol, HCTZ Recheck in 2-4 weeks Monitor blood pressure at home; patient to call if consistently greater than 130/80 Continue DASH diet.   Reminder to go to the ER if any CP, SOB, nausea, dizziness, severe HA, changes vision/speech, left arm numbness and tingling and jaw pain.  Cholesterol Currently above goal;  LDL goal <70 for T2DM, prescribed rosuvastatin but not taking; discussed at length and agrees to start; stop fenofibrate and do rosuvastatin only if any muscle aches Continue low cholesterol diet and exercise.  Check lipid panel.   T2DM Metformin 500 mg TID and doing well   Continue diet and exercise.  Perform daily foot/skin check, notify office of any concerning changes.  Check A1C  BMI 28 Long discussion about weight loss, diet, and exercise Recommended diet heavy in fruits and veggies and low in animal meats, cheeses, and dairy products, appropriate calorie intake Discussed ideal weight for height  Will follow up in 3 months  Hypothyroidism continue medications the same pending lab results reminded to take on an empty stomach 30-48mins before food.  check TSH level  Vitamin D Def Below goal at last visit; she has initiated supplement Encouraged daily adherence goal of 60-100 defer Vit D level to next visit  Continue diet and meds as discussed. Further disposition pending results of labs. Discussed med's effects and SE's.   Over 30 minutes of exam, counseling, chart review, and critical decision making was performed.   Future Appointments  Date Time Provider Department Center  04/01/2020  4:00 PM Olivia Mackie, NP [facility]  05/13/2020  9:30 AM Lucky Cowboy, MD [facility] None  11/10/2020 10:00 AM Lucky Cowboy, MD [facility] None     ----------------------------------------------------------------------------------------------------------------------  HPI 54 y.o. female  presents for 3 month follow up on hypertension, cholesterol, T2 diabetes, weight, hypothyroidism and vitamin D deficiency.   She reports some difficulty falling asleep - questions if she can try melatonin, advsied try 5-15 mg, or alternately benadryl 25-50 mg.   BMI is Body mass index is 28.9 kg/m., she has been working on exercise, but admits not watching diet too much. Has been doing a kickboxing.  Wt Readings from Last 3 Encounters:  02/11/20 158 lb (71.7 kg)  11/10/19 154 lb 11.2 oz (70.2 kg)  08/12/19 159 lb (72.1 kg)   Didn't tolerate olmesartan, unsure of reaction, lisinopril with dry cough Taking losartan 1/2 tab only, atenolol, HCTZ 25 mg Her blood pressure has not been controlled at home (130-160s/80s), today their BP is BP: (!) 164/82  She does workout. She denies chest pain, shortness of breath, dizziness.   She is on cholesterol medication (fenofibrate- currently taking daily, prescribed rosuvastatin but never started, willing to start) and denies myalgias. Her cholesterol is not at goal. The cholesterol last visit was:   Lab Results  Component Value Date   CHOL 212 (H) 11/10/2019   HDL 35 (L) 11/10/2019   LDLCALC 130 (H) 11/10/2019   TRIG 329 (H) 11/10/2019   CHOLHDL 6.1 (H) 11/10/2019    She has been working on diet and exercise for T2DM  She is taking metformin 500 mg TID with meals  She denies foot ulcerations, increased appetite, nausea, paresthesia of the feet, polydipsia, polyuria, visual disturbances, vomiting and weight loss.  She checks fasting glucose 130-140s. Last A1C in the office was:  Lab Results  Component  Value Date   HGBA1C 6.4 (H) 11/10/2019   Stage 2 CKD associated with T2DM, on losartan  Lab Results  Component Value Date   GFRNONAA 85 11/10/2019   She is on thyroid medication. Her medication was  not changed last visit.  Taking 112 mcg daily  Lab Results  Component Value Date   TSH 2.07 11/10/2019  . Patient is on Vitamin D supplement, taking 5000 IU maybe every other day  Lab Results  Component Value Date   VD25OH 24 (L) 11/10/2019       Current Medications:  Current Outpatient Medications on File Prior to Visit  Medication Sig  . atenolol (TENORMIN) 100 MG tablet TAKE 1 TABLET DAILY FOR BLOOD PRESSURE  . hydrochlorothiazide (HYDRODIURIL) 25 MG tablet Take 1 tablet every Morning for BP & Fluid Retention / Ankle Swelling  . levothyroxine (SYNTHROID) 112 MCG tablet Take 1 tablet daily on an empty stomach with only water for 30 minutes & no Antacid meds, Calcium or Magnesium for 4 hours & avoid Biotin  . losartan (COZAAR) 100 MG tablet Take 1 tablet Daily for BP & Diabetic Kidney Protection (Patient taking differently: Take 1/2 tablet Daily for BP & Diabetic Kidney Protection)  . metFORMIN (GLUCOPHAGE-XR) 500 MG 24 hr tablet TAKE 2 TABLETS TWICE A DAY WITH A MEAL FOR DIABETES  . OneTouch Delica Lancets 30G MISC USE TO TEST BLOOD SUGAR ONCE DAILY  . ONETOUCH VERIO test strip USE TO TEST BLOOD SUGAR ONCE DAILY  . fenofibrate micronized (LOFIBRA) 134 MG capsule Take 1 capsule Daily for Triglycerides (Blood Fats)  . rosuvastatin (CRESTOR) 20 MG tablet Take 1 tablet daily for Cholesterol (Patient not taking: Reported on 02/11/2020)   No current facility-administered medications on file prior to visit.     Allergies:  Allergies  Allergen Reactions  . Codeine   . Ppd [Tuberculin Purified Protein Derivative]     Positive reaction 2001  . Lisinopril     Dry cough     Medical History:  Past Medical History:  Diagnosis Date  . Hypertension   . Hypothyroidism   . Splenomegaly 02/14/2019   02/14/2019 abd [place]: Spleen measures 14.1 x 15.6 x 4.7 cm with a measured splenic volume of 542 cubic cm. No focal splenic lesions are evident.   Family history- Reviewed and unchanged Social  history- Reviewed and unchanged   Review of Systems:  Review of Systems  Constitutional: Negative for malaise/fatigue and weight loss.  HENT: Negative for hearing loss and tinnitus.   Eyes: Negative for blurred vision and double vision.  Respiratory: Negative for cough, shortness of breath and wheezing.   Cardiovascular: Negative for chest pain, palpitations, orthopnea, claudication and leg swelling.  Gastrointestinal: Negative for abdominal pain, blood in stool, constipation, diarrhea, heartburn, melena, nausea and vomiting.  Genitourinary: Negative.   Musculoskeletal: Negative for joint pain and myalgias.  Skin: Negative for rash.  Neurological: Negative for dizziness, tingling, sensory change, weakness and headaches.  Endo/Heme/Allergies: Negative for polydipsia.  Psychiatric/Behavioral: Negative.   All other systems reviewed and are negative.     Physical Exam: BP (!) 164/82   Pulse 64   Temp 97.7 F (36.5 C)   Wt 158 lb (71.7 kg)   SpO2 98%   BMI 28.90 kg/m  Wt Readings from Last 3 Encounters:  02/11/20 158 lb (71.7 kg)  11/10/19 154 lb 11.2 oz (70.2 kg)  08/12/19 159 lb (72.1 kg)   General Appearance: Well nourished, in no apparent distress. Eyes: PERRLA, EOMs, conjunctiva  no swelling or erythema Sinuses: No Frontal/maxillary tenderness ENT/Mouth: Ext aud canals clear, TMs without erythema, bulging. No erythema, swelling, or exudate on post pharynx.  Tonsils not swollen or erythematous. Hearing normal.  Neck: Supple, thyroid normal.  Respiratory: Respiratory effort normal, BS equal bilaterally without rales, rhonchi, wheezing or stridor.  Cardio: RRR with no MRGs. Brisk peripheral pulses without edema.  Abdomen: Soft, + BS.  Non tender, no guarding, rebound, hernias, masses. Lymphatics: Non tender without lymphadenopathy.  Musculoskeletal: Full ROM, 5/5 strength, Normal gait Skin: Warm, dry without rashes, lesions, ecchymosis.  Neuro: Cranial nerves intact. No  cerebellar symptoms.  Psych: Awake and oriented X 3, normal affect, Insight and Judgment appropriate.    Dan Maker, NP 4:49 PM [facility]

## 2020-02-12 LAB — CBC WITH DIFFERENTIAL/PLATELET
Absolute Monocytes: 637 cells/uL (ref 200–950)
Basophils Absolute: 49 cells/uL (ref 0–200)
Basophils Relative: 0.5 %
Eosinophils Absolute: 167 cells/uL (ref 15–500)
Eosinophils Relative: 1.7 %
HCT: 38.2 % (ref 35.0–45.0)
Hemoglobin: 13.1 g/dL (ref 11.7–15.5)
Lymphs Abs: 3067 cells/uL (ref 850–3900)
MCH: 29.6 pg (ref 27.0–33.0)
MCHC: 34.3 g/dL (ref 32.0–36.0)
MCV: 86.4 fL (ref 80.0–100.0)
MPV: 10 fL (ref 7.5–12.5)
Monocytes Relative: 6.5 %
Neutro Abs: 5880 cells/uL (ref 1500–7800)
Neutrophils Relative %: 60 %
Platelets: 302 10*3/uL (ref 140–400)
RBC: 4.42 10*6/uL (ref 3.80–5.10)
RDW: 13.1 % (ref 11.0–15.0)
Total Lymphocyte: 31.3 %
WBC: 9.8 10*3/uL (ref 3.8–10.8)

## 2020-02-12 LAB — COMPLETE METABOLIC PANEL WITH GFR
AG Ratio: 1.7 (calc) (ref 1.0–2.5)
ALT: 18 U/L (ref 6–29)
AST: 15 U/L (ref 10–35)
Albumin: 4.7 g/dL (ref 3.6–5.1)
Alkaline phosphatase (APISO): 39 U/L (ref 37–153)
BUN: 16 mg/dL (ref 7–25)
CO2: 28 mmol/L (ref 20–32)
Calcium: 10.2 mg/dL (ref 8.6–10.4)
Chloride: 101 mmol/L (ref 98–110)
Creat: 0.9 mg/dL (ref 0.50–1.05)
GFR, Est African American: 84 mL/min/{1.73_m2} (ref >=60)
GFR, Est Non African American: 72 mL/min/{1.73_m2} (ref >=60)
Globulin: 2.7 g/dL (calc) (ref 1.9–3.7)
Glucose, Bld: 95 mg/dL (ref 65–99)
Potassium: 4.5 mmol/L (ref 3.5–5.3)
Sodium: 138 mmol/L (ref 135–146)
Total Bilirubin: 0.4 mg/dL (ref 0.2–1.2)
Total Protein: 7.4 g/dL (ref 6.1–8.1)

## 2020-02-12 LAB — LIPID PANEL
Cholesterol: 213 mg/dL — ABNORMAL HIGH (ref <200)
HDL: 33 mg/dL — ABNORMAL LOW (ref >=50)
LDL Cholesterol (Calc): 128 mg/dL (calc) — ABNORMAL HIGH
Non-HDL Cholesterol (Calc): 180 mg/dL (calc) — ABNORMAL HIGH (ref <130)
Total CHOL/HDL Ratio: 6.5 (calc) — ABNORMAL HIGH (ref <5.0)
Triglycerides: 363 mg/dL — ABNORMAL HIGH (ref <150)

## 2020-02-12 LAB — HEMOGLOBIN A1C
Hgb A1c MFr Bld: 6.6 % of total Hgb — ABNORMAL HIGH (ref <5.7)
Mean Plasma Glucose: 143 (calc)
eAG (mmol/L): 7.9 (calc)

## 2020-02-12 LAB — TSH: TSH: 3.45 mIU/L

## 2020-02-12 LAB — MAGNESIUM: Magnesium: 2.1 mg/dL (ref 1.5–2.5)

## 2020-02-23 DIAGNOSIS — E119 Type 2 diabetes mellitus without complications: Secondary | ICD-10-CM | POA: Diagnosis not present

## 2020-03-11 ENCOUNTER — Ambulatory Visit: Payer: BC Managed Care – PPO

## 2020-03-17 ENCOUNTER — Ambulatory Visit (INDEPENDENT_AMBULATORY_CARE_PROVIDER_SITE_OTHER): Payer: BC Managed Care – PPO

## 2020-03-17 ENCOUNTER — Other Ambulatory Visit: Payer: Self-pay

## 2020-03-17 VITALS — BP 132/74 | Temp 97.6°F | Wt 158.0 lb

## 2020-03-17 DIAGNOSIS — Z0130 Encounter for examination of blood pressure without abnormal findings: Secondary | ICD-10-CM

## 2020-03-17 DIAGNOSIS — Z23 Encounter for immunization: Secondary | ICD-10-CM | POA: Diagnosis not present

## 2020-03-22 DIAGNOSIS — Z23 Encounter for immunization: Secondary | ICD-10-CM | POA: Diagnosis not present

## 2020-03-27 ENCOUNTER — Other Ambulatory Visit: Payer: Self-pay | Admitting: Internal Medicine

## 2020-03-27 DIAGNOSIS — E782 Mixed hyperlipidemia: Secondary | ICD-10-CM

## 2020-04-01 ENCOUNTER — Other Ambulatory Visit: Payer: Self-pay

## 2020-04-01 ENCOUNTER — Ambulatory Visit (INDEPENDENT_AMBULATORY_CARE_PROVIDER_SITE_OTHER): Payer: BC Managed Care – PPO | Admitting: Nurse Practitioner

## 2020-04-01 ENCOUNTER — Encounter: Payer: Self-pay | Admitting: Nurse Practitioner

## 2020-04-01 VITALS — BP 150/94 | Ht 61.25 in | Wt 157.8 lb

## 2020-04-01 DIAGNOSIS — Z01419 Encounter for gynecological examination (general) (routine) without abnormal findings: Secondary | ICD-10-CM

## 2020-04-01 NOTE — Patient Instructions (Signed)

## 2020-04-01 NOTE — Progress Notes (Signed)
   Jacie Tristan Sippel Aug 13, 1965 [id number]   History:  54 y.o. G2P2 presents for annual exam. Postmenopausal - no HRT, no bleeding. Normal pap and mammogram history. T2DM, HTN managed by PCP.   Gynecologic History Contraception: post menopausal status Last Pap: 03/27/2018. Results were: normal Last mammogram: 05/2019. Results were: normal Last colonoscopy: Never Last Dexa: 03/2019. Results were: normal  Past medical history, past surgical history, family history and social history were all reviewed and documented in the EPIC chart.  ROS:  A ROS was performed and pertinent positives and negatives are included.  Exam:  Vitals:   04/01/20 1608  BP: (!) 150/94  Weight: 157 lb 12.8 oz (71.6 kg)  Height: 5' 1.25" (1.556 m)   Body mass index is 29.57 kg/m.  General appearance:  Normal Thyroid:  Symmetrical, normal in size, without palpable masses or nodularity. Respiratory  Auscultation:  Clear without wheezing or rhonchi Cardiovascular  Auscultation:  Regular rate, without rubs, murmurs or gallops  Edema/varicosities:  Not grossly evident Abdominal  Soft,nontender, without masses, guarding or rebound.  Liver/spleen:  No organomegaly noted  Hernia:  None appreciated  Skin  Inspection:  Grossly normal   Breasts: Examined lying and sitting.   Right: Without masses, retractions, discharge or axillary adenopathy.   Left: Without masses, retractions, discharge or axillary adenopathy. Gentitourinary   Inguinal/mons:  Normal without inguinal adenopathy  External genitalia:  Normal  BUS/Urethra/Skene's glands:  Normal  Vagina:  Normal  Cervix:  Normal  Uterus:  Normal in size, shape and contour.  Midline and mobile  Adnexa/parametria:     Rt: Without masses or tenderness.   Lt: Without masses or tenderness.  Anus and perineum: Normal  Digital rectal exam: Normal sphincter tone without palpated masses or tenderness  Assessment/Plan:  54 y.o. G2P2 for annual exam.   Well  female exam with routine gynecological exam - Education provided on SBEs, importance of preventative screenings, current guidelines, high calcium diet, regular exercise, and multivitamin daily. Labs with PCP.   Screening for cervical cancer - Normal pap history. Will repeat at 5-year interval per guidelines.   Screening for breast cancer - Normal mammogram history. Continue annual screenings. Normal breast exam today.   Screening for colon cancer - Negative Cologuard 11/2019.   Follow up in 1 year for annual.    Olivia Mackie [facility] 4:27 PM 04/01/2020

## 2020-04-05 ENCOUNTER — Other Ambulatory Visit: Payer: Self-pay | Admitting: Internal Medicine

## 2020-04-05 DIAGNOSIS — E039 Hypothyroidism, unspecified: Secondary | ICD-10-CM

## 2020-04-22 ENCOUNTER — Other Ambulatory Visit: Payer: Self-pay | Admitting: Adult Health

## 2020-04-22 DIAGNOSIS — I10 Essential (primary) hypertension: Secondary | ICD-10-CM

## 2020-05-04 ENCOUNTER — Other Ambulatory Visit: Payer: Self-pay | Admitting: Internal Medicine

## 2020-05-04 DIAGNOSIS — F419 Anxiety disorder, unspecified: Secondary | ICD-10-CM

## 2020-05-04 MED ORDER — ALPRAZOLAM 0.5 MG PO TABS: ORAL_TABLET | ORAL | 0 refills | Status: DC

## 2020-05-12 ENCOUNTER — Encounter: Payer: Self-pay | Admitting: Internal Medicine

## 2020-05-12 NOTE — Progress Notes (Signed)
History of Present Illness:       This very nice 54 y.o.  MWF presents for 6 month follow up with HTN, HLD, Pre-Diabetes and Vitamin D Deficiency.       Patient is treated for HTN (2000) & BP has been controlled at home. Today's BP is at goal - 130/84. Patient has had no complaints of any cardiac type chest pain, palpitations, dyspnea Allison Contreras /PND, dizziness, claudication, or dependent edema.      Hyperlipidemia is not controlled with diet & she is non-compliant with her Rosuvastatin. Last Lipids off meds were not at goal:  Lab Results  Component Value Date   CHOL 213 (H) 02/11/2020   HDL 33 (L) 02/11/2020   LDLCALC 128 (H) 02/11/2020   TRIG 363 (H) 02/11/2020   CHOLHDL 6.5 (H) 02/11/2020    Also, the patient has history of  PreDiabetes /Insulin Resistance (2015) man subsequent T2_NIDDM (A1c 6.5% /Feb 2019) w/CKD2. She was started on Metformin in Sept 2019 for A1c 7.1%.  She symptoms of reactive hypoglycemia, diabetic polys, paresthesias or visual blurring.  Last A1c was not at goal:      Further, the patient also has history of Vitamin D Deficiency and supplements vitamin D without any suspected side-effects. Last vitamin D was still very low:  Lab Results  Component Value Date   VD25OH 24 (L) 11/10/2019    Current Outpatient Medications on File Prior to Visit  Medication Sig  . ALPRAZolam 0.5 MG tablet Take     1/2 to 1 tablet  2 to 3 x /day          . atenolol 100 MG tablet TAKE 1 TABLET B EVERY DAY   . Cholecalciferol  5000 u Take 5,000 Units by mouth daily.  . fenofibrate 134 MG cap TAKE 1 CAPSULE DAILY   . hctz 25 MG tablet TAKE 1 TABLET EVERY MORNING   . levothyroxine  112 MCG  TAKE 1 TAB  DAILY   . losartan 100 MG tablet Take 1 tablet Daily   . metFORMIN -XR 500 MG  TAKE 2 TABLETS TWICE A DAY WITH A MEAL   . rosuvastatin  20 MG tablet Patient not taking: Reported on 02/11/2020)    Allergies  Allergen Reactions  . Codeine   . Ppd [Tuberculin Purified  Protein Derivative]     Positive reaction 2001  . Lisinopril     Dry cough    PMHx:   Past Medical History:  Diagnosis Date  . Hypertension   . Hypothyroidism   . Splenomegaly 02/14/2019   02/14/2019 abd [place]: Spleen measures 14.1 x 15.6 x 4.7 cm with a measured splenic volume of 542 cubic cm. No focal splenic lesions are evident.    Immunization History  Administered Date(s) Administered  . Influenza Inj Mdck Quad With Preservative 03/22/2020  . Influenza,inj,Quad PF,6+ Mos 04/01/2019  . PFIZER SARS-COV-2 Vaccination 08/23/2019, 09/13/2019  . Td 06/29/2001, 07/31/2012  . Tdap 07/31/2012    Past Surgical History:  Procedure Laterality Date  . LYMPH NODE DISSECTION     NECK- BENIGN    FHx:    Reviewed / unchanged  SHx:    Reviewed / unchanged   Systems Review:  Constitutional: Denies fever, chills, wt changes, headaches, insomnia, fatigue, night sweats, change in appetite. Eyes: Denies redness, blurred vision, diplopia, discharge, itchy, watery eyes.  ENT: Denies discharge, congestion, post nasal drip, epistaxis, sore throat, earache, hearing loss, dental pain, tinnitus, vertigo, sinus pain,  snoring.  CV: Denies chest pain, palpitations, irregular heartbeat, syncope, dyspnea, diaphoresis, orthopnea, PND, claudication or edema. Respiratory: denies cough, dyspnea, DOE, pleurisy, hoarseness, laryngitis, wheezing.  Gastrointestinal: Denies dysphagia, odynophagia, heartburn, reflux, water brash, abdominal pain or cramps, nausea, vomiting, bloating, diarrhea, constipation, hematemesis, melena, hematochezia  or hemorrhoids. Genitourinary: Denies dysuria, frequency, urgency, nocturia, hesitancy, discharge, hematuria or flank pain. Musculoskeletal: Denies arthralgias, myalgias, stiffness, jt. swelling, pain, limping or strain/sprain.  Skin: Denies pruritus, rash, hives, warts, acne, eczema or change in skin lesion(s). Neuro: No weakness, tremor, incoordination, spasms, paresthesia or  pain. Psychiatric: Denies confusion, memory loss or sensory loss. Endo: Denies change in weight, skin or hair change.  Heme/Lymph: No excessive bleeding, bruising or enlarged lymph nodes.  Physical Exam  BP 130/84   Pulse 68   Temp (!) 96.9 F (36.1 C)   Resp 16   Ht 5\' 2"  (1.575 m)   Wt 153 lb 12.8 oz (69.8 kg)   SpO2 98%   BMI 28.13 kg/m   Appears  well nourished, well groomed  and in no distress.  Eyes: PERRLA, EOMs, conjunctiva no swelling or erythema. Sinuses: No frontal/maxillary tenderness ENT/Mouth: EAC's clear, TM's nl w/o erythema, bulging. Nares clear w/o erythema, swelling, exudates. Oropharynx clear without erythema or exudates. Oral hygiene is good. Tongue normal, non obstructing. Hearing intact.  Neck: Supple. Thyroid not palpable. Car 2+/2+ without bruits, nodes or JVD. Chest: Respirations nl with BS clear & equal w/o rales, rhonchi, wheezing or stridor.  Cor: Heart sounds normal w/ regular rate and rhythm without sig. murmurs, gallops, clicks or rubs. Peripheral pulses normal and equal  without edema.  Abdomen: Soft & bowel sounds normal. Non-tender w/o guarding, rebound, hernias, masses or organomegaly.  Lymphatics: Unremarkable.  Musculoskeletal: Full ROM all peripheral extremities, joint stability, 5/5 strength and normal gait.  Skin: Warm, dry without exposed rashes, lesions or ecchymosis apparent.  Neuro: Cranial nerves intact, reflexes equal bilaterally. Sensory-motor testing grossly intact. Tendon reflexes grossly intact.  Pysch: Alert & oriented x 3.  Insight and judgement nl & appropriate. No ideations.  Assessment and Plan:  1. Essential hypertension  - Continue medication, monitor blood pressure at home.  - Continue DASH diet.  Reminder to go to the ER if any CP,  SOB, nausea, dizziness, severe HA, changes vision/speech.  - CBC with Differential/Platelet - COMPLETE METABOLIC PANEL WITH GFR - Magnesium - TSH  2. Hyperlipidemia associated with  type 2 diabetes mellitus (HCC)  - Continue diet/meds, exercise,& lifestyle modifications.  - Continue monitor periodic cholesterol/liver & renal functions   - Lipid panel - TSH  3. Type 2 diabetes mellitus with stage 2 chronic kidney  disease, without long-term current use of insulin (HCC)  - Continue diet, exercise  - Lifestyle modifications.  - Monitor appropriate labs.  - Hemoglobin A1c - Insulin, random  4. Vitamin D deficiency  - Continue supplementation.  - VITAMIN D 25 Hydroxy  5. Hypothyroidism, unspecified type  - Hemoglobin A1c  6. Medication management  - CBC with Differential/Platelet - COMPLETE METABOLIC PANEL WITH GFR - Magnesium - Lipid panel - TSH - Hemoglobin A1c - Insulin, random - VITAMIN D 25 Hydroxy         Discussed  regular exercise, BP monitoring, weight control to achieve/maintain BMI less than 25 and discussed med and SE's. Recommended labs to assess and monitor clinical status with further disposition pending results of labs.  I discussed the assessment and treatment plan with the patient. The patient was provided an opportunity  to ask questions and all were answered. The patient agreed with the plan and demonstrated an understanding of the instructions.  I provided over 30 minutes of exam, counseling, chart review and  complex critical decision making.   Marinus Maw, MD

## 2020-05-12 NOTE — Patient Instructions (Signed)

## 2020-05-13 ENCOUNTER — Other Ambulatory Visit: Payer: Self-pay

## 2020-05-13 ENCOUNTER — Ambulatory Visit (INDEPENDENT_AMBULATORY_CARE_PROVIDER_SITE_OTHER): Payer: BC Managed Care – PPO | Admitting: Internal Medicine

## 2020-05-13 VITALS — BP 130/84 | HR 68 | Temp 96.9°F | Resp 16 | Ht 62.0 in | Wt 153.8 lb

## 2020-05-13 DIAGNOSIS — E1122 Type 2 diabetes mellitus with diabetic chronic kidney disease: Secondary | ICD-10-CM

## 2020-05-13 DIAGNOSIS — Z79899 Other long term (current) drug therapy: Secondary | ICD-10-CM

## 2020-05-13 DIAGNOSIS — E785 Hyperlipidemia, unspecified: Secondary | ICD-10-CM

## 2020-05-13 DIAGNOSIS — N182 Chronic kidney disease, stage 2 (mild): Secondary | ICD-10-CM

## 2020-05-13 DIAGNOSIS — E039 Hypothyroidism, unspecified: Secondary | ICD-10-CM

## 2020-05-13 DIAGNOSIS — E559 Vitamin D deficiency, unspecified: Secondary | ICD-10-CM

## 2020-05-13 DIAGNOSIS — E1169 Type 2 diabetes mellitus with other specified complication: Secondary | ICD-10-CM | POA: Diagnosis not present

## 2020-05-13 DIAGNOSIS — I10 Essential (primary) hypertension: Secondary | ICD-10-CM

## 2020-05-15 NOTE — Progress Notes (Signed)
========================================================== - Test results slightly outside the reference range are not unusual. If there is anything important, I will review this with you,  otherwise it is considered normal test values.  If you have further questions,  please do not hesitate to contact me at the office or via My Chart.  ==========================================================  -  Total Chol = 215 - elevated  (Ideal or Goal is less than 180)  - and   - LDL Chol = 137 - Very elevated  ( Ideal or Goal is less than   - Increased risk for Heart Attack, Stroke, Kidney Disease or  Vascular Dementia  - So. . . . . It's very important that you restart your  Rosuvastatin (Crestor)                                                                     - Also Triglycerides (   313   ) or fats in blood are too high  (goal is less than 150)    - Recommend avoid fried & greasy foods,  sweets / candy,   - Avoid white rice  (brown or wild rice or Quinoa is OK),   - Avoid white potatoes  (sweet potatoes are OK)   - Avoid anything made from white flour  - bagels, doughnuts, rolls, buns, biscuits, white and   wheat breads, pizza crust and traditional  pasta made of white flour & egg white  - (vegetarian pasta or spinach or wheat pasta is OK).    - Multi-grain bread is OK - like multi-grain flat bread or  sandwich thins.   - Avoid alcohol in excess.   - Exercise is also important. ==========================================================  -  A1c = 6.7% - about the same - still too high - & high risk for  Diabetic complications  (  Ideal or goal is less than 5.7%  )  - So . . . . . Your blood sugar and A1c are elevated.    Being diabetic has a  300% increased risk for heart attack,  stroke, cancer, and alzheimer- type vascular dementia.   It is very important that you work harder with diet by  avoiding all foods that are white except chicken,   fish &  calliflower.  - Avoid white rice  (brown & wild rice is OK),   - Avoid white potatoes  (sweet potatoes in moderation is OK),   White bread or wheat bread or anything made out of   white flour like bagels, donuts, rolls, buns, biscuits, cakes,  - pastries, cookies, pizza crust, and pasta (made from  white flour & egg whites)   - vegetarian pasta or spinach or wheat pasta is OK.  - Multigrain breads like Arnold's, Pepperidge Farm or   multigrain sandwich thins or high fiber breads like   [place] bread or "Dave's Killer" breads that are  4 to 5 grams fiber per slice !  are best.    Diet, exercise and weight loss can reverse and cure  diabetes in the early stages.    - Diet, exercise and weight loss is very important in the   control and prevention of complications of diabetes which  affects every system in your body, ie.   -  Brain - dementia/stroke,  - eyes - glaucoma/blindness,  - heart - heart attack/heart failure,  - kidneys - dialysis,  - stomach - gastric paralysis,  - intestines - malabsorption,  - nerves - severe painful neuritis,  - circulation - gangrene & loss of a leg(s)  - and finally  . . . . . . . . . . . . . . . . . .    - cancer and Alzheimers. ==========================================================  -  Vitamin D = 29 - Extremely Low   - Vitamin D goal is between 70-100.   - Please Restart your your Vitamin D 5,000 unit caps AND also  recommend that you take 2 capsules = 10,000 units EVERY day   - It is very important as a natural anti-inflammatory and helping the  immune system protect against viral infections, like the Covid-19    helping hair, skin, and nails, as well as reducing stroke and  heart attack risk.   - It helps your bones and helps with mood.  - It also decreases numerous cancer risks so please  take it as directed.   - Low Vit D is associated with a 200-300% higher risk for  CANCER   and 200-300% higher risk for HEART    ATTACK  &  STROKE.    - It is also associated with higher death rate at younger ages,   autoimmune diseases like Rheumatoid arthritis, Lupus,  Multiple Sclerosis.     - Also many other serious conditions, like depression, Alzheimer's  Dementia, infertility, muscle aches, fatigue, fibromyalgia   - just to name a few. ==========================================================  - All Else - CBC - Kidneys - Electrolytes - Liver - Magnesium & Thyroid    - all  Normal / OK ===========================================================

## 2020-05-17 LAB — CBC WITH DIFFERENTIAL/PLATELET
Absolute Monocytes: 792 cells/uL (ref 200–950)
Basophils Absolute: 17 cells/uL (ref 0–200)
Basophils Relative: 0.2 %
Eosinophils Absolute: 157 cells/uL (ref 15–500)
Eosinophils Relative: 1.8 %
HCT: 40.5 % (ref 35.0–45.0)
Hemoglobin: 13.6 g/dL (ref 11.7–15.5)
Lymphs Abs: 1636 cells/uL (ref 850–3900)
MCH: 29.4 pg (ref 27.0–33.0)
MCHC: 33.6 g/dL (ref 32.0–36.0)
MCV: 87.5 fL (ref 80.0–100.0)
MPV: 10.1 fL (ref 7.5–12.5)
Monocytes Relative: 9.1 %
Neutro Abs: 6099 cells/uL (ref 1500–7800)
Neutrophils Relative %: 70.1 %
Platelets: 292 10*3/uL (ref 140–400)
RBC: 4.63 10*6/uL (ref 3.80–5.10)
RDW: 13 % (ref 11.0–15.0)
Total Lymphocyte: 18.8 %
WBC: 8.7 10*3/uL (ref 3.8–10.8)

## 2020-05-17 LAB — MAGNESIUM: Magnesium: 2.2 mg/dL (ref 1.5–2.5)

## 2020-05-17 LAB — COMPLETE METABOLIC PANEL WITH GFR
AG Ratio: 1.8 (calc) (ref 1.0–2.5)
ALT: 17 U/L (ref 6–29)
AST: 17 U/L (ref 10–35)
Albumin: 4.8 g/dL (ref 3.6–5.1)
Alkaline phosphatase (APISO): 37 U/L (ref 37–153)
BUN: 14 mg/dL (ref 7–25)
CO2: 28 mmol/L (ref 20–32)
Calcium: 10.1 mg/dL (ref 8.6–10.4)
Chloride: 100 mmol/L (ref 98–110)
Creat: 0.82 mg/dL (ref 0.50–1.05)
GFR, Est African American: 94 mL/min/{1.73_m2} (ref >=60)
GFR, Est Non African American: 81 mL/min/{1.73_m2} (ref >=60)
Globulin: 2.7 g/dL (calc) (ref 1.9–3.7)
Glucose, Bld: 130 mg/dL — ABNORMAL HIGH (ref 65–99)
Potassium: 4.4 mmol/L (ref 3.5–5.3)
Sodium: 138 mmol/L (ref 135–146)
Total Bilirubin: 0.6 mg/dL (ref 0.2–1.2)
Total Protein: 7.5 g/dL (ref 6.1–8.1)

## 2020-05-17 LAB — HEMOGLOBIN A1C
Hgb A1c MFr Bld: 6.7 % of total Hgb — ABNORMAL HIGH (ref <5.7)
Mean Plasma Glucose: 146 mg/dL
eAG (mmol/L): 8.1 mmol/L

## 2020-05-17 LAB — LIPID PANEL
Cholesterol: 215 mg/dL — ABNORMAL HIGH (ref <200)
HDL: 33 mg/dL — ABNORMAL LOW (ref >=50)
LDL Cholesterol (Calc): 137 mg/dL (calc) — ABNORMAL HIGH
Non-HDL Cholesterol (Calc): 182 mg/dL (calc) — ABNORMAL HIGH (ref <130)
Total CHOL/HDL Ratio: 6.5 (calc) — ABNORMAL HIGH (ref <5.0)
Triglycerides: 313 mg/dL — ABNORMAL HIGH (ref <150)

## 2020-05-17 LAB — VITAMIN D 25 HYDROXY (VIT D DEFICIENCY, FRACTURES): Vit D, 25-Hydroxy: 29 ng/mL — ABNORMAL LOW (ref 30–100)

## 2020-05-17 LAB — INSULIN, RANDOM: Insulin: 14.8 u[IU]/mL

## 2020-05-17 LAB — TSH: TSH: 0.76 mIU/L

## 2020-06-03 ENCOUNTER — Telehealth: Payer: Self-pay | Admitting: *Deleted

## 2020-06-03 NOTE — Telephone Encounter (Signed)
Patient called and complained of acid reflux. Per Dr Oneta Rack, patient can try OTC Nexium or Prilosec 1 to 2 tablets daily. Patient was advised to call back if reflux persist.

## 2020-06-29 ENCOUNTER — Other Ambulatory Visit: Payer: Self-pay | Admitting: Internal Medicine

## 2020-06-29 DIAGNOSIS — N182 Chronic kidney disease, stage 2 (mild): Secondary | ICD-10-CM

## 2020-06-29 DIAGNOSIS — E1122 Type 2 diabetes mellitus with diabetic chronic kidney disease: Secondary | ICD-10-CM

## 2020-06-29 DIAGNOSIS — E1165 Type 2 diabetes mellitus with hyperglycemia: Secondary | ICD-10-CM

## 2020-06-29 DIAGNOSIS — I10 Essential (primary) hypertension: Secondary | ICD-10-CM

## 2020-07-15 ENCOUNTER — Other Ambulatory Visit: Payer: Self-pay | Admitting: Nurse Practitioner

## 2020-07-15 DIAGNOSIS — Z1231 Encounter for screening mammogram for malignant neoplasm of breast: Secondary | ICD-10-CM

## 2020-08-11 ENCOUNTER — Encounter: Payer: Self-pay | Admitting: Adult Health Nurse Practitioner

## 2020-08-11 ENCOUNTER — Other Ambulatory Visit: Payer: Self-pay

## 2020-08-11 ENCOUNTER — Ambulatory Visit: Payer: BC Managed Care – PPO | Admitting: Adult Health Nurse Practitioner

## 2020-08-11 VITALS — BP 128/72 | HR 66 | Temp 98.1°F | Wt 155.8 lb

## 2020-08-11 DIAGNOSIS — E1169 Type 2 diabetes mellitus with other specified complication: Secondary | ICD-10-CM | POA: Diagnosis not present

## 2020-08-11 DIAGNOSIS — I10 Essential (primary) hypertension: Secondary | ICD-10-CM

## 2020-08-11 DIAGNOSIS — E785 Hyperlipidemia, unspecified: Secondary | ICD-10-CM

## 2020-08-11 DIAGNOSIS — E1122 Type 2 diabetes mellitus with diabetic chronic kidney disease: Secondary | ICD-10-CM

## 2020-08-11 DIAGNOSIS — N182 Chronic kidney disease, stage 2 (mild): Secondary | ICD-10-CM

## 2020-08-11 NOTE — Progress Notes (Signed)
FOLLOW UP 3 MONTH  Assessment and Plan:    Hypertension Taking losartan 100mg  night, atenolol 100mg  daily, HCTZ 25mg  tab only; increase to whole tab Hx of intolerance with olmesartan, lisinopril  Continue atenolol, HCTZ Recheck in 2-4 weeks Monitor blood pressure at home; patient to call if consistently greater than 130/80 Continue DASH diet.   Reminder to go to the ER if any CP, SOB, nausea, dizziness, severe HA, changes vision/speech, left arm numbness and tingling and jaw pain.  Cholesterol Taking fenofibrate 145mg  daily and rosuvastatin 20mg    LDL goal <70 for T2DM, prescribed rosuvastatin but not taking; discussed at length and agrees to start; stop fenofibrate and do rosuvastatin only if any muscle aches Continue low cholesterol diet and exercise.  Check lipid panel.   T2DM Metformin 500 mg TID and doing well   Continue diet and exercise.  Perform daily foot/skin check, notify office of any concerning changes.  Check A1C  BMI 28 Long discussion about weight loss, diet, and exercise Recommended diet heavy in fruits and veggies and low in animal meats, cheeses, and dairy products, appropriate calorie intake Discussed ideal weight for height  Will follow up in 3 months  Hypothyroidism Taking levothyroxine 112 mcg daily Reminder to take on an empty stomach 30-41mins before first meal of the day.       No antacid medications for 4 hours.   Vitamin D Def Below goal at last visit; she has initiated supplement Encouraged daily adherence goal of 60-100 defer Vit D level to next visit  Continue diet and meds as discussed. Further disposition pending results of labs. Discussed med's effects and SE's.   Over 30 minutes of face to face interview, exam, counseling, chart review, and critical decision making was performed.   Future Appointments  Date Time Provider Department Center  11/17/2020  8:20 AM [facility]  11/25/2020 10:00 AM , MD  [facility] None    ----------------------------------------------------------------------------------------------------------------------  HPI 55 y.o. female  presents for 3 month follow up on HTN, HLD, T2DM, weight, hypothyroidism and vitamin D deficiency.   Reports overall she is doing well.  She does not have any health or medication concerns today.   She watches her 46year old granddaughter.  Reports she has had some difficulties sleeping.  Has tried melatonin which has helped some.  BMI is Body mass index is 28.5 kg/m., she has been working on exercise, but admits not eating as healthy as she should.  Has been doing kickboxing.  Wt Readings from Last 3 Encounters:  08/11/20 155 lb 12.8 oz (70.7 kg)  05/13/20 153 lb 12.8 oz (69.8 kg)  04/01/20 157 lb 12.8 oz (71.6 kg)   Didn't tolerate olmesartan, unsure of reaction, lisinopril, dry cough Taking losartan 1/2 tab only, atenolol, HCTZ 25 mg Her blood pressure has not been controlled at home (130-160s/80s), today their BP is BP: 128/72  She does workout. She denies chest pain, shortness of breath, dizziness.   She is on cholesterol medication (fenofibrate- currently taking daily, prescribed rosuvastatin but never started, willing to start) and denies myalgias. Her cholesterol is not at goal. The cholesterol last visit was:   Lab Results  Component Value Date   CHOL 215 (H) 05/13/2020   HDL 33 (L) 05/13/2020   LDLCALC 137 (H) 05/13/2020   TRIG 313 (H) 05/13/2020   CHOLHDL 6.5 (H) 05/13/2020    She has been working on diet and exercise for T2DM  She is taking metformin 500 mg  Two tablets twice a day with meals  She denies foot ulcerations, increased appetite, nausea, paresthesia of the feet, polydipsia, polyuria, visual disturbances, vomiting and weight loss.  She checks fasting glucose 130-140s. Last A1C in the office was:  Lab Results  Component Value Date   HGBA1C 6.7 (H) 05/13/2020   Stage 2 CKD associated with T2DM, on  losartan  Lab Results  Component Value Date   GFRNONAA 81 05/13/2020   She is on thyroid medication. Her medication was not changed last visit.  Taking 112 mcg daily  Lab Results  Component Value Date   TSH 0.76 05/13/2020  . Patient is on Vitamin D supplement, taking 5000 IU maybe every other day  Lab Results  Component Value Date   VD25OH 2 (L) 05/13/2020       Current Medications:  Current Outpatient Medications on File Prior to Visit  Medication Sig  . ALPRAZolam (XANAX) 0.5 MG tablet Take     1/2 to 1 tablet          2 to 3 x /day            as needed for Anxiety  . atenolol (TENORMIN) 100 MG tablet TAKE 1 TABLET BY MOUTH EVERY DAY FOR BLOOD PRESSURE  . Cholecalciferol 125 MCG (5000 UT) capsule Take 5,000 Units by mouth daily.  . fenofibrate micronized (LOFIBRA) 134 MG capsule TAKE 1 CAPSULE DAILY FOR TRIGLYCERIDES (BLOOD FATS)  . hydrochlorothiazide (HYDRODIURIL) 25 MG tablet TAKE 1 TABLET EVERY MORNING FOR BP & FLUID RETENTION / ANKLE SWELLING  . levothyroxine (SYNTHROID) 112 MCG tablet TAKE 1 TAB BY MOUTH DAILY ON AN EMPTY STOMACH WITH ONLY WATER FOR & NO ANTACID MEDS, CALCIUM OR MAGNESIUM FOR 4 HOURS & AVOID BIOTIN  . losartan (COZAAR) 100 MG tablet TAKE 1 TABLET DAILY FOR BP & DIABETIC KIDNEY PROTECTION  . metFORMIN (GLUCOPHAGE-XR) 500 MG 24 hr tablet TAKE 2 TABLETS TWICE A DAY WITH A MEAL FOR DIABETES  . OneTouch Delica Lancets 30G MISC USE TO TEST BLOOD SUGAR ONCE DAILY  . ONETOUCH VERIO test strip USE TO TEST BLOOD SUGAR ONCE DAILY  . rosuvastatin (CRESTOR) 20 MG tablet Take 1 tablet daily for Cholesterol   No current facility-administered medications on file prior to visit.     Allergies:  Allergies  Allergen Reactions  . Codeine   . Ppd [Tuberculin Purified Protein Derivative]     Positive reaction 2001  . Lisinopril     Dry cough     Medical History:  Past Medical History:  Diagnosis Date  . Hypertension   . Hypothyroidism   . Splenomegaly  02/14/2019   02/14/2019 abd [place]: Spleen measures 14.1 x 15.6 x 4.7 cm with a measured splenic volume of 542 cubic cm. No focal splenic lesions are evident.   Allergies Allergies  Allergen Reactions  . Codeine   . Ppd [Tuberculin Purified Protein Derivative]     Positive reaction 2001  . Lisinopril     Dry cough    SURGICAL HISTORY She  has a past surgical history that includes Lymph node dissection. FAMILY HISTORY Her family history includes Cancer in her mother; Diabetes in her mother; Heart disease in her father; Hyperlipidemia in her mother; Hypertension in her father and mother. SOCIAL HISTORY She  reports that she has never smoked. She has never used smokeless tobacco. She reports current alcohol use. She reports that she does not use drugs.   Review of Systems:  Review of Systems  Constitutional:  Negative for malaise/fatigue and weight loss.  HENT: Negative for hearing loss and tinnitus.   Eyes: Negative for blurred vision and double vision.  Respiratory: Negative for cough, shortness of breath and wheezing.   Cardiovascular: Negative for chest pain, palpitations, orthopnea, claudication and leg swelling.  Gastrointestinal: Negative for abdominal pain, blood in stool, constipation, diarrhea, heartburn, melena, nausea and vomiting.  Genitourinary: Negative.   Musculoskeletal: Negative for joint pain and myalgias.  Skin: Negative for rash.  Neurological: Negative for dizziness, tingling, sensory change, weakness and headaches.  Endo/Heme/Allergies: Negative for polydipsia.  Psychiatric/Behavioral: Negative.   All other systems reviewed and are negative.     Physical Exam: BP 128/72   Pulse 66   Temp 98.1 F (36.7 C)   Wt 155 lb 12.8 oz (70.7 kg)   SpO2 97%   BMI 28.50 kg/m  Wt Readings from Last 3 Encounters:  08/11/20 155 lb 12.8 oz (70.7 kg)  05/13/20 153 lb 12.8 oz (69.8 kg)  04/01/20 157 lb 12.8 oz (71.6 kg)   General Appearance: Well nourished, in no  apparent distress. Eyes: PERRLA, EOMs, conjunctiva no swelling or erythema Sinuses: No Frontal/maxillary tenderness ENT/Mouth: Ext aud canals clear, TMs without erythema, bulging. No erythema, swelling, or exudate on post pharynx.  Tonsils not swollen or erythematous. Hearing normal.  Neck: Supple, thyroid normal.  Respiratory: Respiratory effort normal, BS equal bilaterally without rales, rhonchi, wheezing or stridor.  Cardio: RRR with no MRGs. Brisk peripheral pulses without edema.  Abdomen: Soft, + BS.  Non tender, no guarding, rebound, hernias, masses. Lymphatics: Non tender without lymphadenopathy.  Musculoskeletal: Full ROM, 5/5 strength, Normal gait Skin: Warm, dry without rashes, lesions, ecchymosis.  Neuro: Cranial nerves intact. No cerebellar symptoms.  Psych: Awake and oriented X 3, normal affect, Insight and Judgment appropriate.    Elder Negus, NP 4:29 PM [facility]

## 2020-08-12 LAB — COMPLETE METABOLIC PANEL WITH GFR
AG Ratio: 1.8 (calc) (ref 1.0–2.5)
ALT: 19 U/L (ref 6–29)
AST: 16 U/L (ref 10–35)
Albumin: 4.8 g/dL (ref 3.6–5.1)
Alkaline phosphatase (APISO): 37 U/L (ref 37–153)
BUN: 15 mg/dL (ref 7–25)
CO2: 30 mmol/L (ref 20–32)
Calcium: 10.3 mg/dL (ref 8.6–10.4)
Chloride: 100 mmol/L (ref 98–110)
Creat: 0.98 mg/dL (ref 0.50–1.05)
GFR, Est African American: 76 mL/min/{1.73_m2} (ref >=60)
GFR, Est Non African American: 65 mL/min/{1.73_m2} (ref >=60)
Globulin: 2.6 g/dL (calc) (ref 1.9–3.7)
Glucose, Bld: 89 mg/dL (ref 65–99)
Potassium: 4.4 mmol/L (ref 3.5–5.3)
Sodium: 137 mmol/L (ref 135–146)
Total Bilirubin: 0.4 mg/dL (ref 0.2–1.2)
Total Protein: 7.4 g/dL (ref 6.1–8.1)

## 2020-08-12 LAB — CBC WITH DIFFERENTIAL/PLATELET
Absolute Monocytes: 710 cells/uL (ref 200–950)
Basophils Absolute: 46 cells/uL (ref 0–200)
Basophils Relative: 0.5 %
Eosinophils Absolute: 146 cells/uL (ref 15–500)
Eosinophils Relative: 1.6 %
HCT: 39.1 % (ref 35.0–45.0)
Hemoglobin: 13.1 g/dL (ref 11.7–15.5)
Lymphs Abs: 2994 cells/uL (ref 850–3900)
MCH: 28.7 pg (ref 27.0–33.0)
MCHC: 33.5 g/dL (ref 32.0–36.0)
MCV: 85.7 fL (ref 80.0–100.0)
MPV: 10.1 fL (ref 7.5–12.5)
Monocytes Relative: 7.8 %
Neutro Abs: 5205 cells/uL (ref 1500–7800)
Neutrophils Relative %: 57.2 %
Platelets: 298 10*3/uL (ref 140–400)
RBC: 4.56 10*6/uL (ref 3.80–5.10)
RDW: 13.1 % (ref 11.0–15.0)
Total Lymphocyte: 32.9 %
WBC: 9.1 10*3/uL (ref 3.8–10.8)

## 2020-08-12 LAB — LIPID PANEL
Cholesterol: 208 mg/dL — ABNORMAL HIGH (ref <200)
HDL: 30 mg/dL — ABNORMAL LOW (ref >=50)
Non-HDL Cholesterol (Calc): 178 mg/dL (calc) — ABNORMAL HIGH (ref <130)
Total CHOL/HDL Ratio: 6.9 (calc) — ABNORMAL HIGH (ref <5.0)
Triglycerides: 405 mg/dL — ABNORMAL HIGH (ref <150)

## 2020-08-12 LAB — HEMOGLOBIN A1C
Hgb A1c MFr Bld: 6.7 % of total Hgb — ABNORMAL HIGH (ref <5.7)
Mean Plasma Glucose: 146 mg/dL
eAG (mmol/L): 8.1 mmol/L

## 2020-08-27 ENCOUNTER — Other Ambulatory Visit: Payer: Self-pay | Admitting: Internal Medicine

## 2020-08-27 DIAGNOSIS — E1122 Type 2 diabetes mellitus with diabetic chronic kidney disease: Secondary | ICD-10-CM

## 2020-08-27 DIAGNOSIS — N182 Chronic kidney disease, stage 2 (mild): Secondary | ICD-10-CM

## 2020-11-10 ENCOUNTER — Encounter: Payer: BC Managed Care – PPO | Admitting: Internal Medicine

## 2020-11-17 ENCOUNTER — Ambulatory Visit: Payer: BC Managed Care – PPO

## 2020-11-25 ENCOUNTER — Other Ambulatory Visit: Payer: Self-pay

## 2020-11-25 ENCOUNTER — Ambulatory Visit (INDEPENDENT_AMBULATORY_CARE_PROVIDER_SITE_OTHER): Payer: BC Managed Care – PPO | Admitting: Internal Medicine

## 2020-11-25 ENCOUNTER — Encounter: Payer: Self-pay | Admitting: Internal Medicine

## 2020-11-25 VITALS — BP 128/74 | HR 64 | Temp 97.8°F | Resp 17 | Ht 62.0 in | Wt 155.4 lb

## 2020-11-25 DIAGNOSIS — Z131 Encounter for screening for diabetes mellitus: Secondary | ICD-10-CM

## 2020-11-25 DIAGNOSIS — E1169 Type 2 diabetes mellitus with other specified complication: Secondary | ICD-10-CM

## 2020-11-25 DIAGNOSIS — Z1389 Encounter for screening for other disorder: Secondary | ICD-10-CM | POA: Diagnosis not present

## 2020-11-25 DIAGNOSIS — K760 Fatty (change of) liver, not elsewhere classified: Secondary | ICD-10-CM

## 2020-11-25 DIAGNOSIS — Z79899 Other long term (current) drug therapy: Secondary | ICD-10-CM

## 2020-11-25 DIAGNOSIS — E559 Vitamin D deficiency, unspecified: Secondary | ICD-10-CM

## 2020-11-25 DIAGNOSIS — Z1329 Encounter for screening for other suspected endocrine disorder: Secondary | ICD-10-CM

## 2020-11-25 DIAGNOSIS — R5383 Other fatigue: Secondary | ICD-10-CM

## 2020-11-25 DIAGNOSIS — I10 Essential (primary) hypertension: Secondary | ICD-10-CM

## 2020-11-25 DIAGNOSIS — E039 Hypothyroidism, unspecified: Secondary | ICD-10-CM

## 2020-11-25 DIAGNOSIS — Z8249 Family history of ischemic heart disease and other diseases of the circulatory system: Secondary | ICD-10-CM

## 2020-11-25 DIAGNOSIS — Z13220 Encounter for screening for lipoid disorders: Secondary | ICD-10-CM

## 2020-11-25 DIAGNOSIS — E1122 Type 2 diabetes mellitus with diabetic chronic kidney disease: Secondary | ICD-10-CM

## 2020-11-25 DIAGNOSIS — Z136 Encounter for screening for cardiovascular disorders: Secondary | ICD-10-CM

## 2020-11-25 DIAGNOSIS — E785 Hyperlipidemia, unspecified: Secondary | ICD-10-CM

## 2020-11-25 DIAGNOSIS — Z130 Encounter for screening for diseases of the blood and blood-forming organs and certain disorders involving the immune mechanism: Secondary | ICD-10-CM | POA: Diagnosis not present

## 2020-11-25 DIAGNOSIS — Z1211 Encounter for screening for malignant neoplasm of colon: Secondary | ICD-10-CM

## 2020-11-25 DIAGNOSIS — Z0000 Encounter for general adult medical examination without abnormal findings: Secondary | ICD-10-CM | POA: Diagnosis not present

## 2020-11-25 DIAGNOSIS — Z0001 Encounter for general adult medical examination with abnormal findings: Secondary | ICD-10-CM

## 2020-11-25 DIAGNOSIS — N182 Chronic kidney disease, stage 2 (mild): Secondary | ICD-10-CM

## 2020-11-25 NOTE — Progress Notes (Signed)
Annual Screening/Preventative Visit & Comprehensive Evaluation &  Examination  Future Appointments  Date Time Provider Department Center  11/25/2020 10:00 AM Lucky Cowboy, MD [facility] None        This very nice 55 y.o. MWF presents for a Screening /Preventative Visit & comprehensive evaluation and management of multiple medical co-morbidities.  Patient has been followed for HTN, HLD, T2_NIDDM  and Vitamin D Deficiency.        HTN predates since 2000. Patient's BP has been controlled at home and patient denies any cardiac symptoms as chest pain, palpitations, shortness of breath, dizziness or ankle swelling. Today's BP is at goal - 128/74.        Patient's hyperlipidemia is not controlled with diet and patient has been resistant to take recommended meds for Cholesterol.  Last lipids were not at goal:  Lab Results  Component Value Date   CHOL 208 (H) 08/11/2020   HDL 30 (L) 08/11/2020   LDLCALC  not calculated 08/11/2020   TRIG 405 (H) 08/11/2020   CHOLHDL 6.9 (H) 08/11/2020        Patient has hx/o T2_NIDDM (A1c 6.5% /2019) w/CKD2 (GFR 84) and patient on Metformin denies reactive hypoglycemic symptoms, visual blurring, diabetic polys or paresthesias. Last A1c was not at goal:  Lab Results  Component Value Date   HGBA1C 6.7 (H) 08/11/2020                                            Patient has been on thyroid  replacement  since  dx'd Hypothyroid in 1997.                                                        Finally, patient has history of Vitamin D Deficiency ("20" /2013 & "16" /2014) and last Vitamin D was  still very low:  Lab Results  Component Value Date   VD25OH 29 (L) 05/13/2020    Current Outpatient Medications on File Prior to Visit  Medication Sig   ALPRAZolam  0.5 MG tablet Take 1/2 to 1 tablet  2 to 3 x/day  as needed for Anxiety   atenolol  100 MG tablet TAKE 1 TABLET EVERY DAY    Cholecalciferol 5,000 u Take 5,000 Units  daily.   fenofibrate  134 MG  capsule TAKE 1 CAPSULE DAILY    hydrochlorothiazide 25 MG tablet TAKE 1 TABLET EVERY MORNING   levothyroxine 112 MCG tablet TAKE 1 TAB DAILY    Losartan 100 MG tablet TAKE 1 TABLET DAILY    metFORMIN -XR 500 MG 24 hr tablet Take  2 tablets  2 x /day  with Meals  for Diabetes   Rosuvastatin  20 MG tablet Take 1 tablet daily for Cholesterol    Allergies  Allergen Reactions   Codeine    Ppd [Tuberculin Purified Protein Derivative]     Positive reaction 2001   Lisinopril     Dry cough     Past Medical History:  Diagnosis Date   Hypertension    Hypothyroidism    Splenomegaly 02/14/2019   02/14/2019 abd [place]: Spleen measures 14.1 x 15.6 x 4.7 cm with a measured splenic volume of 542 cubic cm. No focal  splenic lesions are evident.    Health Maintenance  Topic Date Due   PNEUMOCOCCAL POLYSACCHARIDE VACCINE AGE 77-64  Never done   Pneumococcal Vaccine 79-51 Years old  Never done   HIV Screening  Never done   Zoster Vaccines- Shingrix (1 of 2) Never done   OPHTHALMOLOGY EXAM  02/16/2019   PAP SMEAR-Modifier  03/28/2019   MAMMOGRAM  05/25/2020   COVID-19 Vaccine (4 - Booster for Pfizer series) 08/09/2020   FOOT EXAM  11/08/2020   INFLUENZA VACCINE  12/13/2020   HEMOGLOBIN A1C  02/11/2021   TETANUS/TDAP  08/01/2022   Fecal DNA (Cologuard)  11/29/2022   Hepatitis C Screening  Completed   HPV VACCINES  Aged Out    Immunization History  Administered Date(s) Administered   Influenza Inj Mdck Quad With Preservative 03/22/2020   Influenza,inj,Quad PF,6+ Mos 04/01/2019   PFIZER(Purple Top)SARS-COV-2 Vaccination 08/23/2019, 09/13/2019, 05/11/2020   Td 06/29/2001, 07/31/2012   Tdap 07/31/2012    Last Colon - Declines Colonoscopy & Cologard last year.  Last MGM - gets at Gyn office Maryelizabeth Rowan, NP)   Past Surgical History:  Procedure Laterality Date   LYMPH NODE DISSECTION     NECK- BENIGN    Family History  Problem Relation Age of Onset   Cancer Mother        NON HODGKINS  LYMPHOMA   Hypertension Mother    Diabetes Mother    Hyperlipidemia Mother    Hypertension Father    Heart disease Father     Social History   Tobacco Use   Smoking status: Never   Smokeless tobacco: Never  Vaping Use   Vaping Use: Never used  Substance Use Topics   Alcohol use: Yes    Comment: occ.   Drug use: No      ROS Constitutional: Denies fever, chills, weight loss/gain, headaches, insomnia,  night sweats, and change in appetite. Does c/o fatigue. Eyes: Denies redness, blurred vision, diplopia, discharge, itchy, watery eyes.  ENT: Denies discharge, congestion, post nasal drip, epistaxis, sore throat, earache, hearing loss, dental pain, Tinnitus, Vertigo, Sinus pain, snoring.  Cardio: Denies chest pain, palpitations, irregular heartbeat, syncope, dyspnea, diaphoresis, orthopnea, PND, claudication, edema Respiratory: denies cough, dyspnea, DOE, pleurisy, hoarseness, laryngitis, wheezing.  Gastrointestinal: Denies dysphagia, heartburn, reflux, water brash, pain, cramps, nausea, vomiting, bloating, diarrhea, constipation, hematemesis, melena, hematochezia, jaundice, hemorrhoids Genitourinary: Denies dysuria, frequency, urgency, nocturia, hesitancy, discharge, hematuria, flank pain Breast: Breast lumps, nipple discharge, bleeding.  Musculoskeletal: Denies arthralgia, myalgia, stiffness, Jt. Swelling, pain, limp, and strain/sprain. Denies falls. Skin: Denies puritis, rash, hives, warts, acne, eczema, changing in skin lesion Neuro: No weakness, tremor, incoordination, spasms, paresthesia, pain Psychiatric: Denies confusion, memory loss, sensory loss. Denies Depression. Endocrine: Denies change in weight, skin, hair change, nocturia, and paresthesia, diabetic polys, visual blurring, hyper / hypo glycemic episodes.  Heme/Lymph: No excessive bleeding, bruising, enlarged lymph nodes.  Physical Exam  BP 128/74   Pulse 64   Temp 97.8 F (36.6 C)   Resp 17   Ht 5\' 2"  (1.575 m)    Wt 155 lb 6.2 oz (70.5 kg)   SpO2 99%   BMI 28.42 kg/m   General Appearance: Well nourished, well groomed and in no apparent distress.  Eyes: PERRLA, EOMs, conjunctiva no swelling or erythema, normal fundi and vessels. Sinuses: No frontal/maxillary tenderness ENT/Mouth: EACs patent / TMs  nl. Nares clear without erythema, swelling, mucoid exudates. Oral hygiene is good. No erythema, swelling, or exudate. Tongue normal, non-obstructing. Tonsils not swollen or  erythematous. Hearing normal.  Neck: Supple, thyroid not palpable. No bruits, nodes or JVD. Respiratory: Respiratory effort normal.  BS equal and clear bilateral without rales, rhonci, wheezing or stridor. Cardio: Heart sounds are normal with regular rate and rhythm and no murmurs, rubs or gallops. Peripheral pulses are normal and equal bilaterally without edema. No aortic or femoral bruits. Chest: symmetric with normal excursions and percussion. Breasts: Symmetric, without lumps, nipple discharge, retractions, or fibrocystic changes.  Abdomen: Flat, soft with bowel sounds active. Nontender, no guarding, rebound, hernias, masses, or organomegaly.  Lymphatics: Non tender without lymphadenopathy.  Genitourinary:  Musculoskeletal: Full ROM all peripheral extremities, joint stability, 5/5 strength, and normal gait. Skin: Warm and dry without rashes, lesions, cyanosis, clubbing or  ecchymosis.  Neuro: Cranial nerves intact, reflexes equal bilaterally. Normal muscle tone, no cerebellar symptoms. Sensation intact.  Pysch: Alert and oriented X 3, normal affect, Insight and Judgment appropriate.    Assessment and Plan  1. Annual Preventative Screening Examination   2. Essential hypertension  - EKG 12-Lead - [place], RETROPERITNL ABD,  LTD - Urinalysis, Routine w reflex microscopic - Microalbumin / creatinine urine ratio - CBC with Differential/Platelet - COMPLETE METABOLIC PANEL WITH GFR - Magnesium - TSH  3. Hyperlipidemia associated  with type 2 diabetes mellitus (HCC)  - EKG 12-Lead - [place], RETROPERITNL ABD,  LTD - Lipid panel - TSH  4. Type 2 diabetes mellitus with stage 2 chronic kidney  disease, without long-term current use of insulin (HCC)  - EKG 12-Lead - [place], RETROPERITNL ABD,  LTD - Hemoglobin A1c - Insulin, random  5. Vitamin D deficiency  - VITAMIN D 25 Hydroxy   6. Hypothyroidism  - Lipid panel  7. Screening for colorectal cancer  Patient repeatedly refuses Cologard & Colonoscopy despite explained rational for same.  - POC Hemoccult Bld/Stl   8. Screening for ischemic heart disease  - EKG 12-Lead  9. FHx: heart disease  - EKG 12-Lead - [place], RETROPERITNL ABD,  LTD  10. Screening for AAA (aortic abdominal aneurysm)  - [place], RETROPERITNL ABD,  LTD  11. Fatigue  - Iron, Total/Total Iron Binding Cap  - Vitamin B12  12. Medication management   13. Hepatic steatosis          Patient was counseled in prudent diet to achieve/maintain BMI less than 25 for weight control, BP monitoring, regular exercise and medications. Discussed med's effects and SE's. Screening labs and tests as requested with regular follow-up as recommended. Over 40 minutes of exam, counseling, chart review and high complex critical decision making was performed.   Marinus Maw, MD

## 2020-11-25 NOTE — Patient Instructions (Signed)
Due to recent changes in healthcare laws, you may see the results of your imaging and laboratory studies on MyChart before your provider has had a chance to review them.  We understand that in some cases there may be results that are confusing or concerning to you. Not all laboratory results come back in the same time frame and the provider may be waiting for multiple results in order to interpret others.  Please give us 48 hours in order for your provider to thoroughly review all the results before contacting the office for clarification of your results.   ++++++++++++++++++++++++++++++  Vit D  & Vit C 1,000 mg   are recommended to help protect  against the Covid-19 and other Corona viruses.    Also it's recommended  to take  Zinc 50 mg  to help  protect against the Covid-19   and best place to get  is also on [employer]  and don't pay more than 6-8 cents /pill !  ================================ Coronavirus (COVID-19) Are you at risk?  Are you at risk for the Coronavirus (COVID-19)?  To be considered HIGH RISK for Coronavirus (COVID-19), you have to meet the following criteria:  Traveled to [place], [place], [place], [place] or [place]; or in the [place] to [place], [place], [place]  or [place]; and have fever, cough, and shortness of breath within the last 2 weeks of travel OR Been in close contact with a person diagnosed with COVID-19 within the last 2 weeks and have  fever, cough,and shortness of breath  IF YOU DO NOT MEET THESE CRITERIA, YOU ARE CONSIDERED LOW RISK FOR COVID-19.  What to do if you are HIGH RISK for COVID-19?  If you are having a medical emergency, call 911. Seek medical care right away. Before you go to a doctor's office, urgent care or emergency department,  call ahead and tell them about your recent travel, contact with someone diagnosed with COVID-19   and your symptoms.  You should receive instructions from your physician's office regarding  next steps of care.  When you arrive at healthcare provider, tell the healthcare staff immediately you have returned from  visiting [place], [place], [place], [place] or [place]; or traveled in the [place] to [place], [place],  [place] or [place] in the last two weeks or you have been in close contact with a person diagnosed with  COVID-19 in the last 2 weeks.   Tell the health care staff about your symptoms: fever, cough and shortness of breath. After you have been seen by a medical provider, you will be either: Tested for (COVID-19) and discharged home on quarantine except to seek medical care if  symptoms worsen, and asked to  Stay home and avoid contact with others until you get your results (4-5 days)  Avoid travel on public transportation if possible (such as bus, train, or airplane) or Sent to the Emergency Department by EMS for evaluation, COVID-19 testing  and  possible admission depending on your condition and test results.  What to do if you are LOW RISK for COVID-19?  Reduce your risk of any infection by using the same precautions used for avoiding the common cold or flu:  Wash your hands often with soap and warm water for at least 20 seconds.  If soap and water are not readily available,  use an alcohol-based hand sanitizer with at least 60% alcohol.  If coughing or sneezing, cover your mouth and nose by coughing   or sneezing into the elbow areas of your shirt or coat,  into a tissue or into your sleeve (not your hands). Avoid shaking hands with others and consider head nods or verbal greetings only. Avoid touching your eyes, nose, or mouth with unwashed hands.  Avoid close contact with people who are sick. Avoid places or events with large numbers of people in one location, like concerts or sporting events. Carefully consider travel plans you have or are making. If you are planning any travel outside or inside the [place], visit the CDC's Travelers' Health webpage for  the latest health notices. If you have some symptoms but not all symptoms, continue to monitor at home and seek medical attention  if your symptoms worsen. If you are having a medical emergency, call 911. >>>>>>>>>>>>>>>>>>>>>>> Preventive Care for Adults  A healthy lifestyle and preventive care can promote health and wellness. Preventive health guidelines for women include the following key practices. A routine yearly physical is a good way to check with your health care provider about your health and preventive screening. It is a chance to share any concerns and updates on your health and to receive a thorough exam. Visit your dentist for a routine exam and preventive care every 6 months. Brush your teeth twice a day and floss once a day. Good oral hygiene prevents tooth decay and gum disease. The frequency of eye exams is based on your age, health, family medical history, use of contact lenses, and other factors. Follow your health care provider's recommendations for frequency of eye exams. Eat a healthy diet. Foods like vegetables, fruits, whole grains, low-fat dairy products, and lean protein foods contain the nutrients you need without too many calories. Decrease your intake of foods high in solid fats, added sugars, and salt. Eat the right amount of calories for you. Get information about a proper diet from your health care provider, if necessary. Regular physical exercise is one of the most important things you can do for your health. Most adults should get at least 150 minutes of moderate-intensity exercise (any activity that increases your heart rate and causes you to sweat) each week. In addition, most adults need muscle-strengthening exercises on 2 or more days a week. Maintain a healthy weight. The body mass index (BMI) is a screening tool to identify possible weight problems. It provides an estimate of body fat based on height and weight. Your health care provider can find your BMI and can  help you achieve or maintain a healthy weight. For adults 20 years and older: A BMI below 18.5 is considered underweight. A BMI of 18.5 to 24.9 is normal. A BMI of 25 to 29.9 is considered overweight. A BMI of 30 and above is considered obese. Maintain normal blood lipids and cholesterol levels by exercising and minimizing your intake of saturated fat. Eat a balanced diet with plenty of fruit and vegetables. Blood tests for lipids and cholesterol should begin at age 20 and be repeated every 5 years. If your lipid or cholesterol levels are high, you are over 50, or you are at high risk for heart disease, you may need your cholesterol levels checked more frequently. Ongoing high lipid and cholesterol levels should be treated with medicines if diet and exercise are not working. If you smoke, find out from your health care provider how to quit. If you do not use tobacco, do not start. Lung cancer screening is recommended for adults aged 55-80 years who are at high risk for developing   lung cancer because of a history of smoking. A yearly low-dose CT scan of the lungs is recommended for people who have at least a 30-pack-year history of smoking and are a current smoker or have quit within the past 15 years. A pack year of smoking is smoking an average of 1 pack of cigarettes a day for 1 year (for example: 1 pack a day for 30 years or 2 packs a day for 15 years). Yearly screening should continue until the smoker has stopped smoking for at least 15 years. Yearly screening should be stopped for people who develop a health problem that would prevent them from having lung cancer treatment. High blood pressure causes heart disease and increases the risk of stroke. Your blood pressure should be checked at least every 1 to 2 years. Ongoing high blood pressure should be treated with medicines if weight loss and exercise do not work. If you are 55-79 years old, ask your health care provider if you should take aspirin to  prevent strokes. Diabetes screening involves taking a blood sample to check your fasting blood sugar level. This should be done once every 3 years, after age 45, if you are within normal weight and without risk factors for diabetes. Testing should be considered at a younger age or be carried out more frequently if you are overweight and have at least 1 risk factor for diabetes. Breast cancer screening is essential preventive care for women. You should practice "breast self-awareness." This means understanding the normal appearance and feel of your breasts and may include breast self-examination. Any changes detected, no matter how small, should be reported to a health care provider. Women in their 20s and 30s should have a clinical breast exam (CBE) by a health care provider as part of a regular health exam every 1 to 3 years. After age 40, women should have a CBE every year. Starting at age 40, women should consider having a mammogram (breast X-ray test) every year. Women who have a family history of breast cancer should talk to their health care provider about genetic screening. Women at a high risk of breast cancer should talk to their health care providers about having an MRI and a mammogram every year. Breast cancer gene (BRCA)-related cancer risk assessment is recommended for women who have family members with BRCA-related cancers. BRCA-related cancers include breast, ovarian, tubal, and peritoneal cancers. Having family members with these cancers may be associated with an increased risk for harmful changes (mutations) in the breast cancer genes BRCA1 and BRCA2. Results of the assessment will determine the need for genetic counseling and BRCA1 and BRCA2 testing. Routine pelvic exams to screen for cancer are no longer recommended for nonpregnant women who are considered low risk for cancer of the pelvic organs (ovaries, uterus, and vagina) and who do not have symptoms. Ask your health care provider if a  screening pelvic exam is right for you. If you have had past treatment for cervical cancer or a condition that could lead to cancer, you need Pap tests and screening for cancer for at least 20 years after your treatment. If Pap tests have been discontinued, your risk factors (such as having a new sexual partner) need to be reassessed to determine if screening should be resumed. Some women have medical problems that increase the chance of getting cervical cancer. In these cases, your health care provider may recommend more frequent screening and Pap tests. Colorectal cancer can be detected and often prevented. Most routine colorectal   cancer screening begins at the age of 50 years and continues through age 75 years. However, your health care provider may recommend screening at an earlier age if you have risk factors for colon cancer. On a yearly basis, your health care provider may provide home test kits to check for hidden blood in the stool. Use of a small camera at the end of a tube, to directly examine the colon (sigmoidoscopy or colonoscopy), can detect the earliest forms of colorectal cancer. Talk to your health care provider about this at age 50, when routine screening begins.  Direct exam of the colon should be repeated every 5-10 years through age 75 years, unless early forms of pre-cancerous polyps or small growths are found. Hepatitis C blood testing is recommended for all people born from 1945 through 1965 and any individual with known risks for hepatitis C. Pra Osteoporosis is a disease in which the bones lose minerals and strength with aging. This can result in serious bone fractures or breaks. The risk of osteoporosis can be identified using a bone density scan. Women ages 65 years and over and women at risk for fractures or osteoporosis should discuss screening with their health care providers. Ask your health care provider whether you should take a calcium supplement or vitamin D to reduce the  rate of osteoporosis. Menopause can be associated with physical symptoms and risks. Hormone replacement therapy is available to decrease symptoms and risks. You should talk to your health care provider about whether hormone replacement therapy is right for you. Use sunscreen. Apply sunscreen liberally and repeatedly throughout the day. You should seek shade when your shadow is shorter than you. Protect yourself by wearing long sleeves, pants, a wide-brimmed hat, and sunglasses year round, whenever you are outdoors. Once a month, do a whole body skin exam, using a mirror to look at the skin on your back. Tell your health care provider of new moles, moles that have irregular borders, moles that are larger than a pencil eraser, or moles that have changed in shape or color. Stay current with required vaccines (immunizations). Influenza vaccine. All adults should be immunized every year. Tetanus, diphtheria, and acellular pertussis (Td, Tdap) vaccine. Pregnant women should receive 1 dose of Tdap vaccine during each pregnancy. The dose should be obtained regardless of the length of time since the last dose. Immunization is preferred during the 27th-36th week of gestation. An adult who has not previously received Tdap or who does not know her vaccine status should receive 1 dose of Tdap. This initial dose should be followed by tetanus and diphtheria toxoids (Td) booster doses every 10 years. Adults with an unknown or incomplete history of completing a 3-dose immunization series with Td-containing vaccines should begin or complete a primary immunization series including a Tdap dose. Adults should receive a Td booster every 10 years. Varicella vaccine. An adult without evidence of immunity to varicella should receive 2 doses or a second dose if she has previously received 1 dose. Pregnant females who do not have evidence of immunity should receive the first dose after pregnancy. This first dose should be obtained  before leaving the health care facility. The second dose should be obtained 4-8 weeks after the first dose. Human papillomavirus (HPV) vaccine. Females aged 13-26 years who have not received the vaccine previously should obtain the 3-dose series. The vaccine is not recommended for use in pregnant females. However, pregnancy testing is not needed before receiving a dose. If a female is found to   be pregnant after receiving a dose, no treatment is needed. In that case, the remaining doses should be delayed until after the pregnancy. Immunization is recommended for any person with an immunocompromised condition through the age of 26 years if she did not get any or all doses earlier. During the 3-dose series, the second dose should be obtained 4-8 weeks after the first dose. The third dose should be obtained 24 weeks after the first dose and 16 weeks after the second dose. Zoster vaccine. One dose is recommended for adults aged 60 years or older unless certain conditions are present. Measles, mumps, and rubella (MMR) vaccine. Adults born before 1957 generally are considered immune to measles and mumps. Adults born in 1957 or later should have 1 or more doses of MMR vaccine unless there is a contraindication to the vaccine or there is laboratory evidence of immunity to each of the three diseases. A routine second dose of MMR vaccine should be obtained at least 28 days after the first dose for students attending postsecondary schools, health care workers, or international travelers. People who received inactivated measles vaccine or an unknown type of measles vaccine during 1963-1967 should receive 2 doses of MMR vaccine. People who received inactivated mumps vaccine or an unknown type of mumps vaccine before 1979 and are at high risk for mumps infection should consider immunization with 2 doses of MMR vaccine. For females of childbearing age, rubella immunity should be determined. If there is no evidence of immunity,  females who are not pregnant should be vaccinated. If there is no evidence of immunity, females who are pregnant should delay immunization until after pregnancy. Unvaccinated health care workers born before 1957 who lack laboratory evidence of measles, mumps, or rubella immunity or laboratory confirmation of disease should consider measles and mumps immunization with 2 doses of MMR vaccine or rubella immunization with 1 dose of MMR vaccine. Pneumococcal 13-valent conjugate (PCV13) vaccine. When indicated, a person who is uncertain of her immunization history and has no record of immunization should receive the PCV13 vaccine. An adult aged 19 years or older who has certain medical conditions and has not been previously immunized should receive 1 dose of PCV13 vaccine. This PCV13 should be followed with a dose of pneumococcal polysaccharide (PPSV23) vaccine. The PPSV23 vaccine dose should be obtained at least 1 or more year(s) after the dose of PCV13 vaccine. An adult aged 19 years or older who has certain medical conditions and previously received 1 or more doses of PPSV23 vaccine should receive 1 dose of PCV13. The PCV13 vaccine dose should be obtained 1 or more years after the last PPSV23 vaccine dose.  Pneumococcal polysaccharide (PPSV23) vaccine. When PCV13 is also indicated, PCV13 should be obtained first. All adults aged 65 years and older should be immunized. An adult younger than age 65 years who has certain medical conditions should be immunized. Any person who resides in a nursing home or long-term care facility should be immunized. An adult smoker should be immunized. People with an immunocompromised condition and certain other conditions should receive both PCV13 and PPSV23 vaccines. People with human immunodeficiency virus (HIV) infection should be immunized as soon as possible after diagnosis. Immunization during chemotherapy or radiation therapy should be avoided. Routine use of PPSV23 vaccine is  not recommended for American Indians, [place] Natives, or people younger than 65 years unless there are medical conditions that require PPSV23 vaccine. When indicated, people who have unknown immunization and have no record of immunization should receive   PPSV23 vaccine. One-time revaccination 5 years after the first dose of PPSV23 is recommended for people aged 19-64 years who have chronic kidney failure, nephrotic syndrome, asplenia, or immunocompromised conditions. People who received 1-2 doses of PPSV23 before age 65 years should receive another dose of PPSV23 vaccine at age 65 years or later if at least 5 years have passed since the previous dose. Doses of PPSV23 are not needed for people immunized with PPSV23 at or after age 65 years.  Preventive Services / Frequency  Ages 40 to 64 years Blood pressure check. Lipid and cholesterol check. Lung cancer screening. / Every year if you are aged 55-80 years and have a 30-pack-year history of smoking and currently smoke or have quit within the past 15 years. Yearly screening is stopped once you have quit smoking for at least 15 years or develop a health problem that would prevent you from having lung cancer treatment. Clinical breast exam.** / Every year after age 40 years.  BRCA-related cancer risk assessment.** / For women who have family members with a BRCA-related cancer (breast, ovarian, tubal, or peritoneal cancers). Mammogram.** / Every year beginning at age 40 years and continuing for as long as you are in good health. Consult with your health care provider. Pap test.** / Every 3 years starting at age 30 years through age 65 or 70 years with a history of 3 consecutive normal Pap tests. HPV screening.** / Every 3 years from ages 30 years through ages 65 to 70 years with a history of 3 consecutive normal Pap tests. Fecal occult blood test (FOBT) of stool. / Every year beginning at age 50 years and continuing until age 75 years. You may not need to do  this test if you get a colonoscopy every 10 years. Flexible sigmoidoscopy or colonoscopy.** / Every 5 years for a flexible sigmoidoscopy or every 10 years for a colonoscopy beginning at age 50 years and continuing until age 75 years. Hepatitis C blood test.** / For all people born from 1945 through 1965 and any individual with known risks for hepatitis C. Skin self-exam. / Monthly. Influenza vaccine. / Every year. Tetanus, diphtheria, and acellular pertussis (Tdap/Td) vaccine.** / Consult your health care provider. Pregnant women should receive 1 dose of Tdap vaccine during each pregnancy. 1 dose of Td every 10 years. Varicella vaccine.** / Consult your health care provider. Pregnant females who do not have evidence of immunity should receive the first dose after pregnancy. Zoster vaccine.** / 1 dose for adults aged 60 years or older. Pneumococcal 13-valent conjugate (PCV13) vaccine.** / Consult your health care provider. Pneumococcal polysaccharide (PPSV23) vaccine.** / 1 to 2 doses if you smoke cigarettes or if you have certain conditions. Meningococcal vaccine.** / Consult your health care provider. Hepatitis A vaccine.** / Consult your health care provider. Hepatitis B vaccine.** / Consult your health care provider. Screening for abdominal aortic aneurysm (AAA)  by ultrasound is recommended for people over 50 who have history of high blood pressure or who are current or former smokers. ++++++++++++++++++ Recommend Adult Low Dose Aspirin or  coated  Aspirin 81 mg daily  To reduce risk of Colon Cancer 40 %,  Skin Cancer 26 % ,  Melanoma 46%  and  Pancreatic cancer 60% +++++++++++++++++++ Vitamin D goal  is between 70-100.  Please make sure that you are taking your Vitamin D as directed.  It is very important as a natural anti-inflammatory  helping hair, skin, and nails, as well as reducing stroke and heart   attack risk.  It helps your bones and helps with mood. It also decreases  numerous cancer risks so please take it as directed.  Low Vit D is associated with a 200-300% higher risk for CANCER  and 200-300% higher risk for HEART   ATTACK  &  STROKE.   ...................................... It is also associated with higher death rate at younger ages,  autoimmune diseases like Rheumatoid arthritis, Lupus, Multiple Sclerosis.    Also many other serious conditions, like depression, Alzheimer's Dementia, infertility, muscle aches, fatigue, fibromyalgia - just to name a few. ++++++++++++++++++ Recommend the book "The END of DIETING" by Dr Joel Fuhrman  & the book "The END of DIABETES " by Dr Joel Fuhrman At [employer] - get book & Audio CD's    Being diabetic has a  300% increased risk for heart attack, stroke, cancer, and alzheimer- type vascular dementia. It is very important that you work harder with diet by avoiding all foods that are white. Avoid white rice (brown & wild rice is OK), white potatoes (sweetpotatoes in moderation is OK), White bread or wheat bread or anything made out of white flour like bagels, donuts, rolls, buns, biscuits, cakes, pastries, cookies, pizza crust, and pasta (made from white flour & egg whites) - vegetarian pasta or spinach or wheat pasta is OK. Multigrain breads like Arnold's or [employer], or multigrain sandwich thins or flatbreads.  Diet, exercise and weight loss can reverse and cure diabetes in the early stages.  Diet, exercise and weight loss is very important in the control and prevention of complications of diabetes which affects every system in your body, ie. Brain - dementia/stroke, eyes - glaucoma/blindness, heart - heart attack/heart failure, kidneys - dialysis, stomach - gastric paralysis, intestines - malabsorption, nerves - severe painful neuritis, circulation - gangrene & loss of a leg(s), and finally cancer and Alzheimers.    I recommend avoid fried & greasy foods,  sweets/candy, white rice (brown or wild rice or Quinoa is  OK), white potatoes (sweet potatoes are OK) - anything made from white flour - bagels, doughnuts, rolls, buns, biscuits,white and wheat breads, pizza crust and traditional pasta made of white flour & egg white(vegetarian pasta or spinach or wheat pasta is OK).  Multi-grain bread is OK - like multi-grain flat bread or sandwich thins. Avoid alcohol in excess. Exercise is also important.    Eat all the vegetables you want - avoid meat, especially red meat and dairy - especially cheese.  Cheese is the most concentrated form of trans-fats which is the worst thing to clog up our arteries. Veggie cheese is OK which can be found in the fresh produce section at [employer] or [employer] or [employer]  ++++++++++++++++++++++ DASH Eating Plan  DASH stands for "Dietary Approaches to Stop Hypertension."   The DASH eating plan is a healthy eating plan that has been shown to reduce high blood pressure (hypertension). Additional health benefits may include reducing the risk of type 2 diabetes mellitus, heart disease, and stroke. The DASH eating plan may also help with weight loss. WHAT DO I NEED TO KNOW ABOUT THE DASH EATING PLAN? For the DASH eating plan, you will follow these general guidelines: Choose foods with a percent daily value for sodium of less than 5% (as listed on the food label). Use salt-free seasonings or herbs instead of table salt or sea salt. Check with your health care provider or pharmacist before using salt substitutes. Eat lower-sodium products, often labeled as "lower sodium" or "no   salt added." Eat fresh foods. Eat more vegetables, fruits, and low-fat dairy products. Choose whole grains. Look for the word "whole" as the first word in the ingredient list. Choose fish  Limit sweets, desserts, sugars, and sugary drinks. Choose heart-healthy fats. Eat veggie cheese  Eat more home-cooked food and less restaurant, buffet, and fast food. Limit fried foods. Cook foods using methods other  than frying. Limit canned vegetables. If you do use them, rinse them well to decrease the sodium. When eating at a restaurant, ask that your food be prepared with less salt, or no salt if possible.                      WHAT FOODS CAN I EAT? Read Dr Joel Fuhrman's books on The End of Dieting & The End of Diabetes  Grains Whole grain or whole wheat bread. Brown rice. Whole grain or whole wheat pasta. Quinoa, bulgur, and whole grain cereals. Low-sodium cereals. Corn or whole wheat flour tortillas. Whole grain cornbread. Whole grain crackers. Low-sodium crackers.  Vegetables Fresh or frozen vegetables (raw, steamed, roasted, or grilled). Low-sodium or reduced-sodium tomato and vegetable juices. Low-sodium or reduced-sodium tomato sauce and paste. Low-sodium or reduced-sodium canned vegetables.   Fruits All fresh, canned (in natural juice), or frozen fruits.  Protein Products  All fish and seafood.  Dried beans, peas, or lentils. Unsalted nuts and seeds. Unsalted canned beans.  Dairy Low-fat dairy products, such as skim or 1% milk, 2% or reduced-fat cheeses, low-fat ricotta or cottage cheese, or plain low-fat yogurt. Low-sodium or reduced-sodium cheeses.  Fats and Oils Tub margarines without trans fats. Light or reduced-fat mayonnaise and salad dressings (reduced sodium). Avocado. Safflower, olive, or canola oils. Natural peanut or almond butter.  Other Unsalted popcorn and pretzels. The items listed above may not be a complete list of recommended foods or beverages. Contact your dietitian for more options.  ++++++++++++++++++  WHAT FOODS ARE NOT RECOMMENDED? Grains/ White flour or wheat flour White bread. White pasta. White rice. Refined cornbread. Bagels and croissants. Crackers that contain trans fat.  Vegetables  Creamed or fried vegetables. Vegetables in a . Regular canned vegetables. Regular canned tomato sauce and paste. Regular tomato and vegetable juices.  Fruits Dried  fruits. Canned fruit in light or heavy syrup. Fruit juice.  Meat and Other Protein Products Meat in general - RED meat & White meat.  Fatty cuts of meat. Ribs, chicken wings, all processed meats as bacon, sausage, bologna, salami, fatback, hot dogs, bratwurst and packaged luncheon meats.  Dairy Whole or 2% milk, cream, half-and-half, and cream cheese. Whole-fat or sweetened yogurt. Full-fat cheeses or blue cheese. Non-dairy creamers and whipped toppings. Processed cheese, cheese spreads, or cheese curds.  Condiments Onion and garlic salt, seasoned salt, table salt, and sea salt. Canned and packaged gravies. Worcestershire sauce. Tartar sauce. Barbecue sauce. Teriyaki sauce. Soy sauce, including reduced sodium. Steak sauce. Fish sauce. Oyster sauce. Cocktail sauce. Horseradish. Ketchup and mustard. Meat flavorings and tenderizers. Bouillon cubes. Hot sauce. Tabasco sauce. Marinades. Taco seasonings. Relishes.  Fats and Oils Butter, stick margarine, lard, shortening and bacon fat. Coconut, palm kernel, or palm oils. Regular salad dressings.  Pickles and olives. Salted popcorn and pretzels.  The items listed above may not be a complete list of foods and beverages to avoid.   

## 2020-11-26 LAB — URINALYSIS, ROUTINE W REFLEX MICROSCOPIC
Bilirubin Urine: NEGATIVE
Glucose, UA: NEGATIVE
Hgb urine dipstick: NEGATIVE
Ketones, ur: NEGATIVE
Leukocytes,Ua: NEGATIVE
Nitrite: NEGATIVE
Protein, ur: NEGATIVE
Specific Gravity, Urine: 1.005 (ref 1.001–1.035)
pH: 7 (ref 5.0–8.0)

## 2020-11-26 LAB — CBC WITH DIFFERENTIAL/PLATELET
Absolute Monocytes: 540 cells/uL (ref 200–950)
Basophils Absolute: 38 cells/uL (ref 0–200)
Basophils Relative: 0.5 %
Eosinophils Absolute: 106 cells/uL (ref 15–500)
Eosinophils Relative: 1.4 %
HCT: 40.1 % (ref 35.0–45.0)
Hemoglobin: 13.3 g/dL (ref 11.7–15.5)
Lymphs Abs: 2128 cells/uL (ref 850–3900)
MCH: 28.7 pg (ref 27.0–33.0)
MCHC: 33.2 g/dL (ref 32.0–36.0)
MCV: 86.4 fL (ref 80.0–100.0)
MPV: 9.9 fL (ref 7.5–12.5)
Monocytes Relative: 7.1 %
Neutro Abs: 4788 cells/uL (ref 1500–7800)
Neutrophils Relative %: 63 %
Platelets: 289 10*3/uL (ref 140–400)
RBC: 4.64 10*6/uL (ref 3.80–5.10)
RDW: 13.5 % (ref 11.0–15.0)
Total Lymphocyte: 28 %
WBC: 7.6 10*3/uL (ref 3.8–10.8)

## 2020-11-26 LAB — COMPLETE METABOLIC PANEL WITH GFR
AG Ratio: 1.7 (calc) (ref 1.0–2.5)
ALT: 16 U/L (ref 6–29)
AST: 15 U/L (ref 10–35)
Albumin: 4.7 g/dL (ref 3.6–5.1)
Alkaline phosphatase (APISO): 40 U/L (ref 37–153)
BUN: 15 mg/dL (ref 7–25)
CO2: 29 mmol/L (ref 20–32)
Calcium: 10.2 mg/dL (ref 8.6–10.4)
Chloride: 100 mmol/L (ref 98–110)
Creat: 0.83 mg/dL (ref 0.50–1.03)
Globulin: 2.8 g/dL (calc) (ref 1.9–3.7)
Glucose, Bld: 139 mg/dL — ABNORMAL HIGH (ref 65–99)
Potassium: 4.3 mmol/L (ref 3.5–5.3)
Sodium: 140 mmol/L (ref 135–146)
Total Bilirubin: 0.4 mg/dL (ref 0.2–1.2)
Total Protein: 7.5 g/dL (ref 6.1–8.1)
eGFR: 84 mL/min/{1.73_m2} (ref >=60)

## 2020-11-26 LAB — IRON, TOTAL/TOTAL IRON BINDING CAP
%SAT: 16 % (calc) (ref 16–45)
Iron: 64 ug/dL (ref 45–160)
TIBC: 392 mcg/dL (calc) (ref 250–450)

## 2020-11-26 LAB — MICROALBUMIN / CREATININE URINE RATIO
Creatinine, Urine: 27 mg/dL (ref 20–275)
Microalb Creat Ratio: 7 mcg/mg creat (ref <30)
Microalb, Ur: 0.2 mg/dL

## 2020-11-26 LAB — LIPID PANEL
Cholesterol: 219 mg/dL — ABNORMAL HIGH (ref <200)
HDL: 33 mg/dL — ABNORMAL LOW (ref >=50)
LDL Cholesterol (Calc): 133 mg/dL (calc) — ABNORMAL HIGH
Non-HDL Cholesterol (Calc): 186 mg/dL (calc) — ABNORMAL HIGH (ref <130)
Total CHOL/HDL Ratio: 6.6 (calc) — ABNORMAL HIGH (ref <5.0)
Triglycerides: 370 mg/dL — ABNORMAL HIGH (ref <150)

## 2020-11-26 LAB — HEMOGLOBIN A1C
Hgb A1c MFr Bld: 6.8 % of total Hgb — ABNORMAL HIGH (ref <5.7)
Mean Plasma Glucose: 148 mg/dL
eAG (mmol/L): 8.2 mmol/L

## 2020-11-26 LAB — VITAMIN D 25 HYDROXY (VIT D DEFICIENCY, FRACTURES): Vit D, 25-Hydroxy: 35 ng/mL (ref 30–100)

## 2020-11-26 LAB — TSH: TSH: 1.99 mIU/L

## 2020-11-26 LAB — VITAMIN B12: Vitamin B-12: 437 pg/mL (ref 200–1100)

## 2020-11-26 LAB — MAGNESIUM: Magnesium: 2 mg/dL (ref 1.5–2.5)

## 2020-11-26 LAB — INSULIN, RANDOM: Insulin: 11.1 u[IU]/mL

## 2020-11-27 ENCOUNTER — Other Ambulatory Visit: Payer: Self-pay | Admitting: Internal Medicine

## 2020-11-27 DIAGNOSIS — E782 Mixed hyperlipidemia: Secondary | ICD-10-CM

## 2020-11-27 MED ORDER — ROSUVASTATIN CALCIUM 20 MG PO TABS: ORAL_TABLET | ORAL | 3 refills | Status: DC

## 2020-11-27 NOTE — Progress Notes (Signed)
============================================================ - Test results slightly outside the reference range are not unusual. If there is anything important, I will review this with you,  otherwise it is considered normal test values.  If you have further questions,  please do not hesitate to contact me at the office or via My Chart.  ============================================================ ============================================================  - iron levels are Normal , but just barely back in normal range -  Recommend eat   more Veggies with Iron as Carrots, Beets , all leafy green veggies as Spinach,   Collards,  Turnip - Mustard or Mixed Greens, Kale, Asparagus, Broccoli,   Brussels Sprouts, Green Beans / peas, Soybeans, Lentils, Sweet Potatoes ============================================================ ============================================================  - Also,  -  Vitamin B12 =   437     is Very Low  (Ideal or Goal Vit B12 is between 450 - 1,100)   Low Vit B12 may be associated with Anemia , Fatigue,   Peripheral Neuropathy, Dementia, "Brain Fog", & Depression  - Recommend take a sub-lingual form of Vitamin B12 tablet   1,000 to 5,000 mcg tab that you dissolve under your tongue /Daily   - Can get Lavonia Dana - best price at [employer] or on [employer] ============================================================ ============================================================  - Total Chol= 219 is too high             (  Ideal or Goal is less than 180  !  )   - and   - Bad / Dangerous LDL Chol = 133 - Also way too high            (  Ideal or Goal is less than 70  !  )   - Please start the Rosuvastatin Rx  - I just sent it in again   - The main reason for taking Cholesterol medicine to lower your Cholesterol is  To prevent you from having                                    a Stroke, Heart attach or Developing Vascular Dementia  ! ============================================================ ============================================================  - Also, Triglycerides (   370   ) or fats in blood are way too high                     (  Goal is less than 150  !  )    - Recommend avoid fried & greasy foods,  sweets / candy,   - Avoid white rice  (brown or wild rice or Quinoa is OK),   - Avoid white potatoes  (sweet potatoes are OK)   - Avoid anything made from white flour  - bagels, doughnuts, rolls, buns, biscuits, white and   wheat breads, pizza crust and traditional  pasta made of white flour & egg white  - (vegetarian pasta or spinach or wheat pasta is OK).    - Multi-grain bread is OK - like multi-grain flat bread or  sandwich thins.   - Avoid alcohol in excess.   - Exercise is also important. ============================================================ ============================================================  - A1c = 6.8% still too high   ( Ideal or goal  is less than 5.7%  !  )   - So Please work harder on diet & Weight loss !       ============================================================ ============================================================  - Vitamin D = 35 - is Way too low  -  If you are taking  Vit D 5,000 unit capsules,   then increase to 2 capsules = 10,000 units /day  ============================================================ ============================================================  - All Else - CBC - Kidneys - Electrolytes - Liver - Magnesium & Thyroid    - all  Normal / OK ============================================================ ============================================================

## 2020-11-30 ENCOUNTER — Other Ambulatory Visit: Payer: Self-pay | Admitting: Internal Medicine

## 2020-11-30 DIAGNOSIS — I10 Essential (primary) hypertension: Secondary | ICD-10-CM

## 2020-12-01 ENCOUNTER — Other Ambulatory Visit: Payer: Self-pay | Admitting: Adult Health

## 2020-12-01 DIAGNOSIS — E1165 Type 2 diabetes mellitus with hyperglycemia: Secondary | ICD-10-CM

## 2021-01-11 ENCOUNTER — Ambulatory Visit: Payer: Self-pay

## 2021-01-23 ENCOUNTER — Other Ambulatory Visit: Payer: Self-pay | Admitting: Internal Medicine

## 2021-01-23 DIAGNOSIS — E1165 Type 2 diabetes mellitus with hyperglycemia: Secondary | ICD-10-CM

## 2021-02-16 ENCOUNTER — Other Ambulatory Visit: Payer: Self-pay

## 2021-02-16 ENCOUNTER — Ambulatory Visit
Admission: RE | Admit: 2021-02-16 | Discharge: 2021-02-16 | Disposition: A | Discharge status: Home / Self Care | Payer: BC Managed Care – PPO | Source: Ambulatory Visit | Attending: Nurse Practitioner | Admitting: Nurse Practitioner

## 2021-02-16 DIAGNOSIS — Z1231 Encounter for screening mammogram for malignant neoplasm of breast: Secondary | ICD-10-CM | POA: Diagnosis not present

## 2021-02-23 DIAGNOSIS — E119 Type 2 diabetes mellitus without complications: Secondary | ICD-10-CM | POA: Diagnosis not present

## 2021-02-23 LAB — HM DIABETES EYE EXAM

## 2021-02-28 ENCOUNTER — Ambulatory Visit: Payer: BC Managed Care – PPO | Admitting: Nurse Practitioner

## 2021-03-01 NOTE — Progress Notes (Signed)
FOLLOW UP 3 MONTH  Assessment and Plan:    Hypertension Taking losartan 100mg  night, atenolol 100mg  daily, HCTZ 25mg  tab only; increase to whole tab Hx of intolerance with olmesartan, lisinopril  Continue atenolol, HCTZ Recheck in 2-4 weeks Monitor blood pressure at home; patient to call if consistently greater than 130/80 Continue DASH diet.   Reminder to go to the ER if any CP, SOB, nausea, dizziness, severe HA, changes vision/speech, left arm numbness and tingling and jaw pain. CBC  Cholesterol Taking fenofibrate 145mg  daily and rosuvastatin 20mg    LDL goal <70 for T2DM, prescribed rosuvastatin but not taking; discussed at length and agrees to start; stop fenofibrate and do rosuvastatin only if any muscle aches Continue low cholesterol diet and exercise.  Check lipid panel.  CMP  Abnormal Glucose Metformin 500 mg TID and doing well   Continue diet and exercise.  Perform daily foot/skin check, notify office of any concerning changes.  Check A1C  BMI 28.35 Long discussion about weight loss, diet, and exercise Recommended diet heavy in fruits and veggies and low in animal meats, cheeses, and dairy products, appropriate calorie intake Discussed ideal weight for height  Will follow up in 3 months  Hypothyroidism Taking levothyroxine 112 mcg daily Reminder to take on an empty stomach 30-66mins before first meal of the day. No antacid medications for 4 hours. TSH  Anxiety Currently only using Xanax if she flys Well controlled with behavior modification  Vitamin D Def Below goal at last visit; she has initiated supplement Encouraged daily adherence goal of 60-100 Check Vit D level   Left knee pain Monitor symptoms, suspecting muscle strain- use NSAIDS and rest as needed No signs of blood clot- good DP and PT pulses, normal color and no heat.  If symptoms worsen call and we will get imaging.  Flu vaccine need Flu vaccine QUAD 6+Mos PF IM  Continue diet and meds as  discussed. Further disposition pending results of labs. Discussed med's effects and SE's.   Over 30 minutes of face to face interview, exam, counseling, chart review, and critical decision making was performed.   Future Appointments  Date Time Provider Department Center  04/04/2021  4:00 PM , NP [facility] None  05/31/2021  9:30 AM , MD [facility] None    ----------------------------------------------------------------------------------------------------------------------  HPI 55 y.o. female  presents for 3 month follow up on HTN, HLD, T2DM, weight, hypothyroidism and vitamin D deficiency.   Reports overall she is doing well.  She has been having pain in back of left knee for several weeks, worse with ambulation.  Tylenol has helped some.  Denies swelling/heat in left leg.   She watches her 85 year old granddaughter.  Reports she has had some difficulties sleeping.  Has not tried using Melatonin  BMI is Body mass index is 28.35 kg/m., she has been working on exercise, but admits not eating as healthy as she should.  Has been doing kickboxing.  Wt Readings from Last 3 Encounters:  03/02/21 155 lb (70.3 kg)  11/25/20 155 lb 6.2 oz (70.5 kg)  08/11/20 155 lb 12.8 oz (70.7 kg)   Didn't tolerate olmesartan, unsure of reaction, lisinopril, dry cough Taking losartan 1 tab only, atenolol 100mg  QD , HCTZ 25 mg Her blood pressure has not been controlled at home (130-140s/70-80s), today their BP is BP: 140/70  She does workout. She denies chest pain, shortness of breath, dizziness.   She is on cholesterol medication (fenofibrate- currently taking daily, prescribed rosuvastatin but  never started, willing to start) and denies myalgias. Her cholesterol is not at goal. The cholesterol last visit was:   Lab Results  Component Value Date   CHOL 219 (H) 11/25/2020   HDL 33 (L) 11/25/2020   LDLCALC 133 (H) 11/25/2020   TRIG 370 (H) 11/25/2020   CHOLHDL 6.6 (H)  11/25/2020    She has been working on diet and exercise for T2DM  She is taking metformin 500 mg  Two tablets twice a day with meals  She denies foot ulcerations, increased appetite, nausea, paresthesia of the feet, polydipsia, polyuria, visual disturbances, vomiting and weight loss.  She checks fasting glucose 130-140s. Last A1C in the office was:  Lab Results  Component Value Date   HGBA1C 6.8 (H) 11/25/2020   Stage 2 CKD associated with T2DM, on losartan  Lab Results  Component Value Date   GFRNONAA 65 08/11/2020   She is on thyroid medication. Her medication was not changed last visit.  Taking 112 mcg daily  Lab Results  Component Value Date   TSH 1.99 11/25/2020  . Patient is on Vitamin D supplement, taking 5000 IU maybe every other day  Lab Results  Component Value Date   VD25OH 35 11/25/2020       Current Medications:  Current Outpatient Medications on File Prior to Visit  Medication Sig   ALPRAZolam (XANAX) 0.5 MG tablet Take     1/2 to 1 tablet          2 to 3 x /day            as needed for Anxiety   atenolol (TENORMIN) 100 MG tablet TAKE 1 TABLET BY MOUTH EVERY DAY FOR BLOOD PRESSURE   Cholecalciferol 125 MCG (5000 UT) capsule Take 5,000 Units by mouth daily.   fenofibrate micronized (LOFIBRA) 134 MG capsule TAKE 1 CAPSULE DAILY FOR TRIGLYCERIDES (BLOOD FATS)   glucose blood (ONETOUCH VERIO) test strip Check blood sugar Daily  ( Dx:  e.11 )   hydrochlorothiazide (HYDRODIURIL) 25 MG tablet TAKE 1 TABLET EVERY MORNING FOR BP & FLUID RETENTION / ANKLE SWELLING   Lancets (ONETOUCH DELICA PLUS LANCET30G) MISC USE TO TEST BLOOD SUGAR ONCE DAILY   levothyroxine (SYNTHROID) 112 MCG tablet TAKE 1 TAB BY MOUTH DAILY ON AN EMPTY STOMACH WITH ONLY WATER FOR & NO ANTACID MEDS, CALCIUM OR MAGNESIUM FOR 4 HOURS & AVOID BIOTIN   losartan (COZAAR) 100 MG tablet TAKE 1 TABLET DAILY FOR BP & DIABETIC KIDNEY PROTECTION   metFORMIN (GLUCOPHAGE-XR) 500 MG 24 hr tablet Take  2  tablets  2 x /day  with Meals  for Diabetes   rosuvastatin (CRESTOR) 20 MG tablet Take  1 tablet  Daily  for Cholesterol (Patient not taking: Reported on 03/02/2021)   No current facility-administered medications on file prior to visit.     Allergies:  Allergies  Allergen Reactions   Codeine    Ppd [Tuberculin Purified Protein Derivative]     Positive reaction 2001   Lisinopril     Dry cough     Medical History:  Past Medical History:  Diagnosis Date   Hypertension    Hypothyroidism    Splenomegaly 02/14/2019   02/14/2019 abd [place]: Spleen measures 14.1 x 15.6 x 4.7 cm with a measured splenic volume of 542 cubic cm. No focal splenic lesions are evident.   Allergies Allergies  Allergen Reactions   Codeine    Ppd [Tuberculin Purified Protein Derivative]     Positive reaction 2001  Lisinopril     Dry cough    SURGICAL HISTORY She  has a past surgical history that includes Lymph node dissection. FAMILY HISTORY Her family history includes Cancer in her mother; Diabetes in her mother; Heart disease in her father; Hyperlipidemia in her mother; Hypertension in her father and mother. SOCIAL HISTORY She  reports that she has never smoked. She has never used smokeless tobacco. She reports current alcohol use. She reports that she does not use drugs.   Review of Systems:  Review of Systems  Constitutional:  Negative for chills, fever, malaise/fatigue and weight loss.  HENT:  Negative for congestion, hearing loss and tinnitus.   Eyes:  Negative for blurred vision and double vision.  Respiratory:  Negative for cough, shortness of breath and wheezing.   Cardiovascular:  Negative for chest pain, palpitations, orthopnea, claudication and leg swelling.  Gastrointestinal:  Negative for abdominal pain, blood in stool, constipation, diarrhea, heartburn, melena, nausea and vomiting.  Genitourinary: Negative.   Musculoskeletal:  Positive for joint pain (left knee pain). Negative for falls  and myalgias.  Skin:  Negative for rash.  Neurological:  Negative for dizziness, tingling, tremors, sensory change, loss of consciousness, weakness and headaches.  Endo/Heme/Allergies:  Negative for polydipsia.  Psychiatric/Behavioral: Negative.  Negative for depression, memory loss and suicidal ideas.   All other systems reviewed and are negative.    Physical Exam: BP 140/70   Pulse 66   Temp (!) 97.5 F (36.4 C)   Wt 155 lb (70.3 kg)   SpO2 98%   BMI 28.35 kg/m  Wt Readings from Last 3 Encounters:  03/02/21 155 lb (70.3 kg)  11/25/20 155 lb 6.2 oz (70.5 kg)  08/11/20 155 lb 12.8 oz (70.7 kg)   General Appearance: Well nourished, in no apparent distress. Eyes: PERRLA, EOMs, conjunctiva no swelling or erythema Sinuses: No Frontal/maxillary tenderness ENT/Mouth: Ext aud canals clear, TMs without erythema, bulging. No erythema, swelling, or exudate on post pharynx.  Tonsils not swollen or erythematous. Hearing normal.  Neck: Supple, thyroid normal.  Respiratory: Respiratory effort normal, BS equal bilaterally without rales, rhonchi, wheezing or stridor.  Cardio: RRR with no MRGs. Brisk peripheral pulses without edema.  Abdomen: Soft, + BS.  Non tender, no guarding, rebound, hernias, masses. Lymphatics: Non tender without lymphadenopathy.  Musculoskeletal: Full ROM, 5/5 strength, Normal gait.  No redness or warmth of left popliteal, good pulses PT and DP Skin: Warm, dry without rashes, lesions, ecchymosis.  Neuro: Cranial nerves intact. No cerebellar symptoms.  Psych: Awake and oriented X 3, normal affect, Insight and Judgment appropriate.    Revonda Humphrey, NP 4:06 PM [facility]

## 2021-03-02 ENCOUNTER — Encounter: Payer: Self-pay | Admitting: Nurse Practitioner

## 2021-03-02 ENCOUNTER — Other Ambulatory Visit: Payer: Self-pay

## 2021-03-02 ENCOUNTER — Ambulatory Visit: Payer: BC Managed Care – PPO | Admitting: Nurse Practitioner

## 2021-03-02 VITALS — BP 140/70 | HR 66 | Temp 97.5°F | Wt 155.0 lb

## 2021-03-02 DIAGNOSIS — E782 Mixed hyperlipidemia: Secondary | ICD-10-CM

## 2021-03-02 DIAGNOSIS — E559 Vitamin D deficiency, unspecified: Secondary | ICD-10-CM | POA: Diagnosis not present

## 2021-03-02 DIAGNOSIS — Z23 Encounter for immunization: Secondary | ICD-10-CM | POA: Diagnosis not present

## 2021-03-02 DIAGNOSIS — E663 Overweight: Secondary | ICD-10-CM

## 2021-03-02 DIAGNOSIS — I10 Essential (primary) hypertension: Secondary | ICD-10-CM | POA: Diagnosis not present

## 2021-03-02 DIAGNOSIS — E039 Hypothyroidism, unspecified: Secondary | ICD-10-CM

## 2021-03-02 DIAGNOSIS — M25562 Pain in left knee: Secondary | ICD-10-CM

## 2021-03-02 DIAGNOSIS — R7309 Other abnormal glucose: Secondary | ICD-10-CM

## 2021-03-02 DIAGNOSIS — F419 Anxiety disorder, unspecified: Secondary | ICD-10-CM | POA: Diagnosis not present

## 2021-03-02 NOTE — Patient Instructions (Signed)

## 2021-03-03 LAB — COMPLETE METABOLIC PANEL WITH GFR
AG Ratio: 1.7 (calc) (ref 1.0–2.5)
ALT: 17 U/L (ref 6–29)
AST: 14 U/L (ref 10–35)
Albumin: 4.7 g/dL (ref 3.6–5.1)
Alkaline phosphatase (APISO): 36 U/L — ABNORMAL LOW (ref 37–153)
BUN: 16 mg/dL (ref 7–25)
CO2: 30 mmol/L (ref 20–32)
Calcium: 10 mg/dL (ref 8.6–10.4)
Chloride: 99 mmol/L (ref 98–110)
Creat: 0.91 mg/dL (ref 0.50–1.03)
Globulin: 2.7 g/dL (calc) (ref 1.9–3.7)
Glucose, Bld: 118 mg/dL — ABNORMAL HIGH (ref 65–99)
Potassium: 4.4 mmol/L (ref 3.5–5.3)
Sodium: 138 mmol/L (ref 135–146)
Total Bilirubin: 0.3 mg/dL (ref 0.2–1.2)
Total Protein: 7.4 g/dL (ref 6.1–8.1)
eGFR: 75 mL/min/{1.73_m2} (ref >=60)

## 2021-03-03 LAB — CBC WITH DIFFERENTIAL/PLATELET
Absolute Monocytes: 735 cells/uL (ref 200–950)
Basophils Absolute: 47 cells/uL (ref 0–200)
Basophils Relative: 0.5 %
Eosinophils Absolute: 158 cells/uL (ref 15–500)
Eosinophils Relative: 1.7 %
HCT: 39.4 % (ref 35.0–45.0)
Hemoglobin: 12.9 g/dL (ref 11.7–15.5)
Lymphs Abs: 2930 cells/uL (ref 850–3900)
MCH: 28.1 pg (ref 27.0–33.0)
MCHC: 32.7 g/dL (ref 32.0–36.0)
MCV: 85.8 fL (ref 80.0–100.0)
MPV: 10.3 fL (ref 7.5–12.5)
Monocytes Relative: 7.9 %
Neutro Abs: 5431 cells/uL (ref 1500–7800)
Neutrophils Relative %: 58.4 %
Platelets: 315 10*3/uL (ref 140–400)
RBC: 4.59 10*6/uL (ref 3.80–5.10)
RDW: 13.1 % (ref 11.0–15.0)
Total Lymphocyte: 31.5 %
WBC: 9.3 10*3/uL (ref 3.8–10.8)

## 2021-03-03 LAB — LIPID PANEL
Cholesterol: 212 mg/dL — ABNORMAL HIGH (ref <200)
HDL: 32 mg/dL — ABNORMAL LOW (ref >=50)
Non-HDL Cholesterol (Calc): 180 mg/dL (calc) — ABNORMAL HIGH (ref <130)
Total CHOL/HDL Ratio: 6.6 (calc) — ABNORMAL HIGH (ref <5.0)
Triglycerides: 444 mg/dL — ABNORMAL HIGH (ref <150)

## 2021-03-03 LAB — TSH: TSH: 1.25 mIU/L

## 2021-03-03 LAB — HEMOGLOBIN A1C
Hgb A1c MFr Bld: 6.6 % of total Hgb — ABNORMAL HIGH (ref <5.7)
Mean Plasma Glucose: 143 mg/dL
eAG (mmol/L): 7.9 mmol/L

## 2021-03-03 LAB — VITAMIN D 25 HYDROXY (VIT D DEFICIENCY, FRACTURES): Vit D, 25-Hydroxy: 41 ng/mL (ref 30–100)

## 2021-03-07 ENCOUNTER — Encounter: Payer: Self-pay | Admitting: Internal Medicine

## 2021-03-17 ENCOUNTER — Other Ambulatory Visit: Payer: Self-pay | Admitting: Nurse Practitioner

## 2021-03-17 ENCOUNTER — Other Ambulatory Visit: Payer: Self-pay | Admitting: Adult Health

## 2021-03-17 DIAGNOSIS — M25562 Pain in left knee: Secondary | ICD-10-CM

## 2021-03-17 DIAGNOSIS — E039 Hypothyroidism, unspecified: Secondary | ICD-10-CM

## 2021-03-17 DIAGNOSIS — E782 Mixed hyperlipidemia: Secondary | ICD-10-CM

## 2021-03-17 MED ORDER — MELOXICAM 7.5 MG PO TABS: 7.5000 mg | ORAL_TABLET | Freq: Every day | ORAL | 2 refills | Status: DC

## 2021-03-18 ENCOUNTER — Other Ambulatory Visit: Payer: Self-pay

## 2021-03-18 ENCOUNTER — Ambulatory Visit
Admission: RE | Admit: 2021-03-18 | Discharge: 2021-03-18 | Disposition: A | Discharge status: Home / Self Care | Payer: BC Managed Care – PPO | Source: Ambulatory Visit | Attending: Nurse Practitioner | Admitting: Nurse Practitioner

## 2021-03-18 DIAGNOSIS — M25562 Pain in left knee: Secondary | ICD-10-CM | POA: Diagnosis not present

## 2021-04-02 ENCOUNTER — Other Ambulatory Visit: Payer: Self-pay | Admitting: Adult Health Nurse Practitioner

## 2021-04-02 DIAGNOSIS — E1165 Type 2 diabetes mellitus with hyperglycemia: Secondary | ICD-10-CM

## 2021-04-02 DIAGNOSIS — E1122 Type 2 diabetes mellitus with diabetic chronic kidney disease: Secondary | ICD-10-CM

## 2021-04-02 DIAGNOSIS — I10 Essential (primary) hypertension: Secondary | ICD-10-CM

## 2021-04-04 ENCOUNTER — Other Ambulatory Visit: Payer: Self-pay

## 2021-04-04 ENCOUNTER — Ambulatory Visit (INDEPENDENT_AMBULATORY_CARE_PROVIDER_SITE_OTHER): Payer: BC Managed Care – PPO | Admitting: Nurse Practitioner

## 2021-04-04 ENCOUNTER — Encounter: Payer: Self-pay | Admitting: Nurse Practitioner

## 2021-04-04 VITALS — BP 134/86 | Ht 61.0 in | Wt 152.0 lb

## 2021-04-04 DIAGNOSIS — Z780 Asymptomatic menopausal state: Secondary | ICD-10-CM

## 2021-04-04 DIAGNOSIS — Z01419 Encounter for gynecological examination (general) (routine) without abnormal findings: Secondary | ICD-10-CM

## 2021-04-04 NOTE — Progress Notes (Signed)
THIS ENCOUNTER IS A VIRTUAL VISIT DUE TO COVID-19 - PATIENT WAS NOT SEEN IN THE OFFICE.  PATIENT HAS CONSENTED TO VIRTUAL VISIT / TELEMEDICINE VISIT   Virtual Visit via telephone Note  I connected with  Allison Contreras on 04/05/2021 by telephone.  I verified that I am speaking with the correct person using two identifiers.    I discussed the limitations of evaluation and management by telemedicine and the availability of in person appointments. The patient expressed understanding and agreed to proceed.  History of Present Illness:  Pulse 66   Temp 97.9 F (36.6 C)   LMP 09/06/2016   SpO2 97%  55 y.o. patient contacted office reporting URI sx . she tested positive by covid test at office 04/05/21. OV was conducted by telephone to minimize exposure. This patient was vaccinated for covid 19, last 2/2 and 2 boosters last one 11/2020  Sx began 7 days ago with sore throat, productive cough of green mucus and nasal drainage, sinus pressure, low grade fever. Denies body aches. Unable to sleep due to productive cough   Treatments tried so far: Dayquil and Tylenol have helped  Exposures: Granddaughter   Medications  Current Outpatient Medications (Endocrine & Metabolic):    levothyroxine (SYNTHROID) 112 MCG tablet, 1 TAB DAILY ON EMPTY STOMACH W/ ONLY H2O X 30MINS & NO ANTACID MEDS, CALCIUM/MAGNESIUM X 4HRS & AVOID BIOTIN   metFORMIN (GLUCOPHAGE-XR) 500 MG 24 hr tablet, Take  2 tablets  2 x /day  with Meals  for Diabetes  Current Outpatient Medications (Cardiovascular):    atenolol (TENORMIN) 100 MG tablet, TAKE 1 TABLET BY MOUTH EVERY DAY FOR BLOOD PRESSURE   fenofibrate micronized (LOFIBRA) 134 MG capsule, TAKE 1 CAPSULE DAILY FOR TRIGLYCERIDES (BLOOD FATS)   hydrochlorothiazide (HYDRODIURIL) 25 MG tablet, TAKE 1 TABLET EVERY MORNING FOR BLOOD PRESSURE & FLUID RETENTION / ANKLE SWELLING   losartan (COZAAR) 100 MG tablet, TAKE 1 TABLET DAILY FOR BP & DIABETIC KIDNEY PROTECTION    rosuvastatin (CRESTOR) 20 MG tablet, Take  1 tablet  Daily  for Cholesterol (Patient not taking: Reported on 04/04/2021)  Current Outpatient Medications (Respiratory):    Pseudoephedrine-APAP-DM (DAYQUIL MULTI-SYMPTOM COLD/FLU PO), Take by mouth.  Current Outpatient Medications (Analgesics):    acetaminophen (TYLENOL) 500 MG tablet, Take 500 mg by mouth every 6 (six) hours as needed.   meloxicam (MOBIC) 7.5 MG tablet, Take 1 tablet (7.5 mg total) by mouth daily. (Patient not taking: Reported on 04/04/2021)   Current Outpatient Medications (Other):    ALPRAZolam (XANAX) 0.5 MG tablet, Take     1/2 to 1 tablet          2 to 3 x /day            as needed for Anxiety   Cholecalciferol 125 MCG (5000 UT) capsule, Take 5,000 Units by mouth daily.   glucose blood (ONETOUCH VERIO) test strip, Check blood sugar Daily  ( Dx:  e.11 )   Lancets (ONETOUCH DELICA PLUS Q000111Q) MISC, USE TO TEST BLOOD SUGAR ONCE DAILY  Allergies:  Allergies  Allergen Reactions   Codeine    Ppd [Tuberculin Purified Protein Derivative]     Positive reaction 2001   Lisinopril     Dry cough    Problem list She has Essential hypertension; Hypothyroidism; Hyperlipidemia associated with type 2 diabetes mellitus ([place]); Irregular periods/menstrual cycles; Vitamin D deficiency; Medication management; BMI 28.0-28.9,adult; FHx: heart disease; Type 2 diabetes mellitus with stage 2 chronic kidney disease, without long-term current  use of insulin (HCC); and Hepatic steatosis on their problem list.   Social History:   reports that she has never smoked. She has never used smokeless tobacco. She reports current alcohol use. She reports that she does not use drugs.  Observations/Objective:  General : Well sounding patient in no apparent distress HEENT: no hoarseness, no cough for duration of visit Lungs: speaks in complete sentences, no audible wheezing, no apparent distress Neurological: alert, oriented x 3 Psychiatric:  pleasant, judgement appropriate   Assessment and Plan:  Covid 19 Covid 19 positive per rapid screening test in office Risk factors include: hypertension, diabetes, overweight Symptoms are: mild       Flu-like symptoms -     POCT Influenza A/B  Encounter for screening for COVID-19 -     POC COVID-19  COVID-19 -     promethazine-dextromethorphan (PROMETHAZINE-DM) 6.25-15 MG/5ML syrup; Take 5 mLs by mouth 4 (four) times daily as needed for cough. -     dexamethasone (DECADRON) 1 MG tablet; Take 3 tabs for 3 days, 2 tabs for 3 days 1 tab for 5 days. Take with food.   Immue support reviewed  Take tylenol PRN temp 101+ Push hydration Regular ambulation or calf exercises exercises for clot prevention and 81 mg ASA unless contraindicated Sx supportive therapy suggested Follow up via mychart or telephone if needed Advised patient obtain O2 monitor; present to ED if persistently <90% or with severe dyspnea, CP, fever uncontrolled by tylenol, confusion, sudden decline Should remain in isolation until at least 5 days from onset of sx, 24-48 hours fever free without tylenol, sx such as cough are improved.      Follow Up Instructions:  I discussed the assessment and treatment plan with the patient. The patient was provided an opportunity to ask questions and all were answered. The patient agreed with the plan and demonstrated an understanding of the instructions.   The patient was advised to call back or seek an in-person evaluation if the symptoms worsen or if the condition fails to improve as anticipated.  I provided 20 minutes of non-face-to-face time during this encounter.   Revonda Humphrey, NP

## 2021-04-04 NOTE — Progress Notes (Signed)
   Allison Contreras 07-15-1965 [id number]   History:  55 y.o. G2P2 presents for annual exam. Postmenopausal - no HRT, no bleeding. Normal pap and mammogram history. T2DM, HTN, HLD, and hypothyroidism managed by PCP.   Gynecologic History Contraception: post menopausal status Sexually active: Yes  Health maintenance Last Pap: 03/27/2018. Results were: Normal, 5-year repeat Last mammogram: 02/16/2021. Results were: Normal Last colonoscopy: Never. Negative Cologuard 11/2019 Last Dexa: 04/09/2019. Results were: Normal  Past medical history, past surgical history, family history and social history were all reviewed and documented in the EPIC chart. Married. Retired. 82 yo daughter, 21 yo son - has 62 yo daughter.   ROS:  A ROS was performed and pertinent positives and negatives are included.  Exam:  Vitals:   04/04/21 1554  BP: 134/86  Weight: 152 lb (68.9 kg)  Height: 5\' 1"  (1.549 m)    Body mass index is 28.72 kg/m.  General appearance:  Normal Thyroid:  Symmetrical, normal in size, without palpable masses or nodularity. Respiratory  Auscultation:  Clear without wheezing or rhonchi Cardiovascular  Auscultation:  Regular rate, without rubs, murmurs or gallops  Edema/varicosities:  Not grossly evident Abdominal  Soft,nontender, without masses, guarding or rebound.  Liver/spleen:  No organomegaly noted  Hernia:  None appreciated  Skin  Inspection:  Grossly normal   Breasts: Examined lying and sitting.   Right: Without masses, retractions, discharge or axillary adenopathy.   Left: Without masses, retractions, discharge or axillary adenopathy. Genitourinary   Inguinal/mons:  Normal without inguinal adenopathy  External genitalia:  Normal appearing vulva with no masses, tenderness, or lesions  BUS/Urethra/Skene's glands:  Normal  Vagina:  Normal appearing with normal color and discharge, no lesions. Atrophic changes  Cervix:  Normal appearing without discharge or  lesions  Uterus:  Normal in size, shape and contour.  Midline and mobile, nontender  Adnexa/parametria:     Rt: Normal in size, without masses or tenderness.   Lt: Normal in size, without masses or tenderness.  Anus and perineum: Normal  Digital rectal exam: Normal sphincter tone without palpated masses or tenderness  Patient informed chaperone available to be present for breast and pelvic exam. Patient has requested no chaperone to be present. Patient has been advised what will be completed during breast and pelvic exam.   Assessment/Plan:  55 y.o. G2P2 for annual exam.   Well female exam with routine gynecological exam - Education provided on SBEs, importance of preventative screenings, current guidelines, high calcium diet, regular exercise, and multivitamin daily. Labs with PCP.   Postmenopausal - no HRT, no bleeding.   Screening for cervical cancer - Normal pap history. Will repeat at 5-year interval per guidelines.   Screening for breast cancer - Normal mammogram history. Continue annual screenings. Normal breast exam today.   Screening for colon cancer - Negative Cologuard 11/2019.  Screening for osteoporosis - Normal bone density 03/2019. Will repeat at 5-year interval per recommendation.   Follow up in 1 year for annual.    04/2019 [facility], 4:18 PM 04/04/2021

## 2021-04-05 ENCOUNTER — Other Ambulatory Visit: Payer: Self-pay

## 2021-04-05 ENCOUNTER — Encounter: Payer: Self-pay | Admitting: Nurse Practitioner

## 2021-04-05 ENCOUNTER — Ambulatory Visit (INDEPENDENT_AMBULATORY_CARE_PROVIDER_SITE_OTHER): Payer: BC Managed Care – PPO | Admitting: Nurse Practitioner

## 2021-04-05 VITALS — HR 66 | Temp 97.9°F

## 2021-04-05 DIAGNOSIS — R6889 Other general symptoms and signs: Secondary | ICD-10-CM | POA: Diagnosis not present

## 2021-04-05 DIAGNOSIS — U071 COVID-19: Secondary | ICD-10-CM

## 2021-04-05 DIAGNOSIS — Z1152 Encounter for screening for COVID-19: Secondary | ICD-10-CM

## 2021-04-05 LAB — POCT INFLUENZA A/B
Influenza A, POC: NEGATIVE
Influenza B, POC: NEGATIVE

## 2021-04-05 LAB — POC COVID19 BINAXNOW: SARS Coronavirus 2 Ag: POSITIVE — AB

## 2021-04-05 MED ORDER — DEXAMETHASONE 1 MG PO TABS: ORAL_TABLET | ORAL | 0 refills | Status: DC

## 2021-04-05 MED ORDER — PROMETHAZINE-DM 6.25-15 MG/5ML PO SYRP: 5.0000 mL | ORAL_SOLUTION | Freq: Four times a day (QID) | ORAL | 1 refills | Status: DC | PRN

## 2021-05-30 ENCOUNTER — Encounter: Payer: Self-pay | Admitting: Internal Medicine

## 2021-05-30 NOTE — Progress Notes (Signed)
Future Appointments  Date Time Provider Department  05/31/2021  9:30 AM Unk Pinto, MD [facility]  04/05/2022  4:00 PM Tamela Gammon, NP [facility]    History of Present Illness:       This very nice 56 y.o. MWF presents for 6  month follow up with HTN, HLD, T2_NIDDM and Vitamin D Deficiency.        Patient is treated for HTN (2000) & BP has been controlled at home. Todays BP is elevated at 180/90 ! Patient has had no complaints of any cardiac type chest pain, palpitations, dyspnea / orthopnea / PND, dizziness, claudication, or dependent edema.       Patient refuses to take meds for Cholesterol  & her hyperlipidemia is not controlled with diet & fenofibrate.  Last Lipids were not at goal :  Lab Results  Component Value Date   CHOL 212 (H) 03/02/2021   HDL 32 (L) 03/02/2021   LDLCALC  not calculated 03/02/2021   TRIG 444 (H) 03/02/2021   CHOLHDL 6.6 (H) 03/02/2021     Also, the patient has history of T2_NIDDM (A1c 6.5% /2019) w/CKD2 (GFR 84) and has had no symptoms of reactive hypoglycemia, diabetic polys, paresthesias or visual blurring.  Last A1c was not at goal :  Lab Results  Component Value Date   HGBA1C 6.6 (H) 03/02/2021                                                                    Patient was dx'd Hypothyroid in 1997 & has been has been  on  thyroid   replacement  since.                                                                                                            Further, the patient also has history of Vitamin D Deficiency ("20" /2013 & "16" /2014) and  sporadically supplements vitamin D. Last vitamin D was still low :   Lab Results  Component Value Date   VD25OH 41 03/02/2021     Current Outpatient Medications on File Prior to Visit  Medication Sig   acetaminophen 500 MG tablet Take  every 6  hours as needed.   ALPRAZolam 0.5 MG tablet Take 1/2 to 1 tablet 2 to 3 x /day  as needed for Anxiety   atenolol 100 MG tablet TAKE 1  TABLET  EVERY DAY    Cholecalciferol 5000 u Take daily.   fenofibrate micronized  134 MG capsule TAKE 1 CAPSULE DAILY    hydrochlorothiazide 25 MG tablet TAKE 1 TABLET EVERY MORNING    levothyroxine  112 MCG tablet 1 TAB DAILY    losartan  100 MG tablet TAKE 1 TABLET DAILY    metFORMIN-XR 500 MG  Take  2 tablets  2 x /day  promethazine-DM) 6.25-15 MG/5ML syrup Take 5 mLs 4  times daily as needed for cough.   DAYQUIL MULTI-SYMPTOM  Take by mouth.   rosuvastatin 20 MG tablet (Patient not taking: Reported on 04/04/2021)    Allergies  Allergen Reactions   Codeine    Ppd [Tuberculin Purified Protein Derivative]     Positive reaction 2001   Lisinopril     Dry cough    PMHx:   Past Medical History:  Diagnosis Date   Hypertension    Hypothyroidism    Splenomegaly 02/14/2019   02/14/2019 abd [place]: Spleen measures 14.1 x 15.6 x 4.7 cm with a measured splenic volume of 542 cubic cm. No focal splenic lesions are evident.     Immunization History  Administered Date(s) Administered   Influenza Inj Mdck Quad With Preservative 03/22/2020   Influenza,inj,Quad PF,6+ Mos 04/01/2019, 03/02/2021   PFIZER(Purple Top)SARS-COV-2 Vaccination 08/23/2019, 09/13/2019, 05/11/2020, 12/03/2020   Td 06/29/2001, 07/31/2012   Tdap 07/31/2012     Past Surgical History:  Procedure Laterality Date   LYMPH NODE DISSECTION     NECK- BENIGN    FHx:    Reviewed / unchanged  SHx:    Reviewed / unchanged   Systems Review:  Constitutional: Denies fever, chills, wt changes, headaches, insomnia, fatigue, night sweats, change in appetite. Eyes: Denies redness, blurred vision, diplopia, discharge, itchy, watery eyes.  ENT: Denies discharge, congestion, post nasal drip, epistaxis, sore throat, earache, hearing loss, dental pain, tinnitus, vertigo, sinus pain, snoring.  CV: Denies chest pain, palpitations, irregular heartbeat, syncope, dyspnea, diaphoresis, orthopnea, PND, claudication or edema. Respiratory:  denies cough, dyspnea, DOE, pleurisy, hoarseness, laryngitis, wheezing.  Gastrointestinal: Denies dysphagia, odynophagia, heartburn, reflux, water brash, abdominal pain or cramps, nausea, vomiting, bloating, diarrhea, constipation, hematemesis, melena, hematochezia  or hemorrhoids. Genitourinary: Denies dysuria, frequency, urgency, nocturia, hesitancy, discharge, hematuria or flank pain. Musculoskeletal: Denies arthralgias, myalgias, stiffness, jt. swelling, pain, limping or strain/sprain.  Skin: Denies pruritus, rash, hives, warts, acne, eczema or change in skin lesion(s). Neuro: No weakness, tremor, incoordination, spasms, paresthesia or pain. Psychiatric: Denies confusion, memory loss or sensory loss. Endo: Denies change in weight, skin or hair change.  Heme/Lymph: No excessive bleeding, bruising or enlarged lymph nodes.  Physical Exam  BP (!) 180/90    Pulse 68    Temp 97.9 F (36.6 C)    Resp 16    Ht 5\' 1"  (1.549 m)    Wt 154 lb 6.4 oz (70 kg)    LMP 09/06/2016    SpO2 99%    BMI 29.17 kg/m   Appears  well nourished, well groomed  and in no distress.  Eyes: PERRLA, EOMs, conjunctiva no swelling or erythema. Sinuses: No frontal/maxillary tenderness ENT/Mouth: EAC's clear, TM's nl w/o erythema, bulging. Nares clear w/o erythema, swelling, exudates. Oropharynx clear without erythema or exudates. Oral hygiene is good. Tongue normal, non obstructing. Hearing intact.  Neck: Supple. Thyroid not palpable. Car 2+/2+ without bruits, nodes or JVD. Chest: Respirations nl with BS clear & equal w/o rales, rhonchi, wheezing or stridor.  Cor: Heart sounds normal w/ regular rate and rhythm without sig. murmurs, gallops, clicks or rubs. Peripheral pulses normal and equal  without edema.  Abdomen: Soft & bowel sounds normal. Non-tender w/o guarding, rebound, hernias, masses or organomegaly.  Lymphatics: Unremarkable.  Musculoskeletal: Full ROM all peripheral extremities, joint stability, 5/5 strength  and normal gait.  Skin: Warm, dry without exposed rashes, lesions or ecchymosis apparent.  Neuro: Cranial nerves intact, reflexes equal bilaterally. Sensory-motor testing grossly  intact. Tendon reflexes grossly intact.  Pysch: Alert & oriented x 3.  Insight and judgement nl & appropriate. No ideations.  Assessment and Plan:  1. Essential hypertension  - Continue medication, monitor blood pressure at home.  - Continue DASH diet.  Reminder to go to the ER if any CP,  SOB, nausea, dizziness, severe HA, changes vision/speech.   - CBC with Differential/Platelet - COMPLETE METABOLIC PANEL WITH GFR - Magnesium - TSH  2. Hyperlipidemia associated with type 2 diabetes mellitus ([place])  - Continue diet/meds, exercise,& lifestyle modifications.  - Continue monitor periodic cholesterol/liver & renal functions    - Lipid panel - TSH  3. Type 2 diabetes mellitus with stage 2 chronic kidney  disease, without long-term current use of insulin ([facility])  - Continue diet, exercise  - Lifestyle modifications.  - Monitor appropriate labs   - Hemoglobin A1c - Insulin, random  4. Vitamin D deficiency  - Continue supplementation    - VITAMIN D 25 Hydroxy   5. Hypothyroidism  - TSH  6. Medication management  - CBC with Differential/Platelet - COMPLETE METABOLIC PANEL WITH GFR - Magnesium - Lipid panel - TSH - Hemoglobin A1c - Insulin, random - VITAMIN D 25 Hydroxy          Discussed  regular exercise, BP monitoring, weight control to achieve/maintain BMI less than 25 and discussed med and SE's. Recommended labs to assess and monitor clinical status with further disposition pending results of labs.  I discussed the assessment and treatment plan with the patient. The patient was provided an opportunity to ask questions and all were answered. The patient agreed with the plan and demonstrated an understanding of the instructions.  I provided over 30 minutes of exam, counseling, chart review  and  complex critical decision making.        The patient was advised to call back or seek an in-person evaluation if the symptoms worsen or if the condition fails to improve as anticipated.   Kirtland Bouchard, MD

## 2021-05-30 NOTE — Patient Instructions (Signed)

## 2021-05-31 ENCOUNTER — Encounter: Payer: Self-pay | Admitting: Internal Medicine

## 2021-05-31 ENCOUNTER — Other Ambulatory Visit: Payer: Self-pay

## 2021-05-31 ENCOUNTER — Ambulatory Visit: Payer: BC Managed Care – PPO | Admitting: Internal Medicine

## 2021-05-31 VITALS — BP 180/90 | HR 68 | Temp 97.9°F | Resp 16 | Ht 61.0 in | Wt 154.4 lb

## 2021-05-31 DIAGNOSIS — E559 Vitamin D deficiency, unspecified: Secondary | ICD-10-CM | POA: Diagnosis not present

## 2021-05-31 DIAGNOSIS — E1122 Type 2 diabetes mellitus with diabetic chronic kidney disease: Secondary | ICD-10-CM | POA: Diagnosis not present

## 2021-05-31 DIAGNOSIS — E785 Hyperlipidemia, unspecified: Secondary | ICD-10-CM

## 2021-05-31 DIAGNOSIS — Z79899 Other long term (current) drug therapy: Secondary | ICD-10-CM

## 2021-05-31 DIAGNOSIS — E1169 Type 2 diabetes mellitus with other specified complication: Secondary | ICD-10-CM

## 2021-05-31 DIAGNOSIS — N182 Chronic kidney disease, stage 2 (mild): Secondary | ICD-10-CM

## 2021-05-31 DIAGNOSIS — I10 Essential (primary) hypertension: Secondary | ICD-10-CM | POA: Diagnosis not present

## 2021-05-31 DIAGNOSIS — E039 Hypothyroidism, unspecified: Secondary | ICD-10-CM

## 2021-06-01 ENCOUNTER — Other Ambulatory Visit: Payer: Self-pay | Admitting: Internal Medicine

## 2021-06-01 DIAGNOSIS — E1169 Type 2 diabetes mellitus with other specified complication: Secondary | ICD-10-CM

## 2021-06-01 DIAGNOSIS — E785 Hyperlipidemia, unspecified: Secondary | ICD-10-CM

## 2021-06-01 LAB — CBC WITH DIFFERENTIAL/PLATELET
Absolute Monocytes: 680 cells/uL (ref 200–950)
Basophils Absolute: 43 cells/uL (ref 0–200)
Basophils Relative: 0.5 %
Eosinophils Absolute: 187 cells/uL (ref 15–500)
Eosinophils Relative: 2.2 %
HCT: 38.3 % (ref 35.0–45.0)
Hemoglobin: 13.2 g/dL (ref 11.7–15.5)
Lymphs Abs: 1700 cells/uL (ref 850–3900)
MCH: 29.7 pg (ref 27.0–33.0)
MCHC: 34.5 g/dL (ref 32.0–36.0)
MCV: 86.3 fL (ref 80.0–100.0)
MPV: 10.1 fL (ref 7.5–12.5)
Monocytes Relative: 8 %
Neutro Abs: 5891 cells/uL (ref 1500–7800)
Neutrophils Relative %: 69.3 %
Platelets: 278 10*3/uL (ref 140–400)
RBC: 4.44 10*6/uL (ref 3.80–5.10)
RDW: 14.2 % (ref 11.0–15.0)
Total Lymphocyte: 20 %
WBC: 8.5 10*3/uL (ref 3.8–10.8)

## 2021-06-01 LAB — LIPID PANEL
Cholesterol: 204 mg/dL — ABNORMAL HIGH (ref <200)
HDL: 35 mg/dL — ABNORMAL LOW (ref >=50)
LDL Cholesterol (Calc): 121 mg/dL (calc) — ABNORMAL HIGH
Non-HDL Cholesterol (Calc): 169 mg/dL (calc) — ABNORMAL HIGH (ref <130)
Total CHOL/HDL Ratio: 5.8 (calc) — ABNORMAL HIGH (ref <5.0)
Triglycerides: 334 mg/dL — ABNORMAL HIGH (ref <150)

## 2021-06-01 LAB — COMPLETE METABOLIC PANEL WITH GFR
AG Ratio: 1.7 (calc) (ref 1.0–2.5)
ALT: 15 U/L (ref 6–29)
AST: 15 U/L (ref 10–35)
Albumin: 4.8 g/dL (ref 3.6–5.1)
Alkaline phosphatase (APISO): 33 U/L — ABNORMAL LOW (ref 37–153)
BUN: 14 mg/dL (ref 7–25)
CO2: 30 mmol/L (ref 20–32)
Calcium: 9.8 mg/dL (ref 8.6–10.4)
Chloride: 101 mmol/L (ref 98–110)
Creat: 0.9 mg/dL (ref 0.50–1.03)
Globulin: 2.8 g/dL (calc) (ref 1.9–3.7)
Glucose, Bld: 126 mg/dL — ABNORMAL HIGH (ref 65–99)
Potassium: 4.3 mmol/L (ref 3.5–5.3)
Sodium: 138 mmol/L (ref 135–146)
Total Bilirubin: 0.4 mg/dL (ref 0.2–1.2)
Total Protein: 7.6 g/dL (ref 6.1–8.1)
eGFR: 75 mL/min/{1.73_m2} (ref >=60)

## 2021-06-01 LAB — VITAMIN D 25 HYDROXY (VIT D DEFICIENCY, FRACTURES): Vit D, 25-Hydroxy: 34 ng/mL (ref 30–100)

## 2021-06-01 LAB — HEMOGLOBIN A1C
Hgb A1c MFr Bld: 6.9 % of total Hgb — ABNORMAL HIGH (ref <5.7)
Mean Plasma Glucose: 151 mg/dL
eAG (mmol/L): 8.4 mmol/L

## 2021-06-01 LAB — TSH: TSH: 1.06 mIU/L

## 2021-06-01 LAB — MAGNESIUM: Magnesium: 2 mg/dL (ref 1.5–2.5)

## 2021-06-01 LAB — INSULIN, RANDOM: Insulin: 20.7 u[IU]/mL — ABNORMAL HIGH

## 2021-06-01 MED ORDER — ROSUVASTATIN CALCIUM 10 MG PO TABS: ORAL_TABLET | ORAL | 3 refills | Status: DC

## 2021-06-01 NOTE — Progress Notes (Signed)
============================================================ - Test results slightly outside the reference range are not unusual. If there is anything important, I will review this with you,  otherwise it is considered normal test values.  If you have further questions,  please do not hesitate to contact me at the office or via My Chart.  ============================================================ ============================================================  -  Total  Chol =    204   - Elevated             (  Ideal  or  Goal is less than 180  !  )   - and   -  Bad / Dangerous LDL  Chol =  121    - also Elevated              (  Ideal  or  Goal is less than 70  !  )   - Elevated Cholesterols are High Risk for Heart Attack, Stroke, Kidney Failure &   Dementia,      Like you're sitting on a TIME . . . . . . . . .So sent in a                                                                         new Rx for low dose Rosuvastatin  ============================================================ ============================================================  -  Also Triglycerides (  334   ) or fats in blood are too high  (goal is less than 150)    - Recommend avoid fried & greasy foods,  sweets / candy,   - Avoid white rice  (brown or wild rice or Quinoa is OK),   - Avoid white potatoes  (sweet potatoes are OK)   - Avoid anything made from white flour  - bagels, doughnuts, rolls, buns, biscuits, white and   wheat breads, pizza crust and traditional  pasta made of white flour & egg white  - (vegetarian pasta or spinach or wheat pasta is OK).    - Multi-grain bread is OK - like multi-grain flat bread or  sandwich thins.   - Avoid alcohol in excess.   - Exercise is also important. ============================================================ ============================================================  - A1c creeping up - now up to 6.9%  ( goal is less than 5.7 %  )                                                    - need to get on a stricter diet & Lose weight ! ============================================================ ============================================================  -  Vitamin D = 34 - extremely low  ! !                    ( They're always low  ! )  - if taking 5,000 units / day, then please increase to   2 capsules /day ( = 10,000 units /day) ============================================================ ============================================================  - Vitamin D goal is between 70-100.    - It is very important as a natural anti-inflammatory and helping the  immune system protect against viral infections, like the Covid-19    helping hair, skin,  and nails, as well as reducing stroke and  heart attack risk.   - It helps your bones and helps with mood.  - It also decreases numerous cancer risks so please  take it as directed.   - Low Vit D is associated with a 200-300% higher risk for  CANCER   and 200-300% higher risk for HEART   ATTACK  &  STROKE.    - It is also associated with higher death rate at younger ages,   autoimmune diseases like Rheumatoid arthritis, Lupus,  Multiple Sclerosis.     - Also many other serious conditions, like depression, Alzheimer's  Dementia, infertility, muscle aches, fatigue, fibromyalgia   - just to name a few. ============================================================ ============================================================  -  All Else - CBC - Kidneys - Electrolytes - Liver - Magnesium & Thyroid    - all  Normal / OK ============================================================ ============================================================

## 2021-06-06 ENCOUNTER — Encounter: Payer: Self-pay | Admitting: Internal Medicine

## 2021-07-02 DIAGNOSIS — H66011 Acute suppurative otitis media with spontaneous rupture of ear drum, right ear: Secondary | ICD-10-CM | POA: Diagnosis not present

## 2021-07-02 DIAGNOSIS — J0100 Acute maxillary sinusitis, unspecified: Secondary | ICD-10-CM | POA: Diagnosis not present

## 2021-07-04 ENCOUNTER — Other Ambulatory Visit: Payer: Self-pay | Admitting: Internal Medicine

## 2021-07-04 MED ORDER — GLIPIZIDE 5 MG PO TABS: ORAL_TABLET | ORAL | 0 refills | Status: DC

## 2021-07-11 ENCOUNTER — Telehealth: Payer: Self-pay | Admitting: Adult Health

## 2021-07-11 DIAGNOSIS — I10 Essential (primary) hypertension: Secondary | ICD-10-CM

## 2021-07-11 MED ORDER — ATENOLOL 100 MG PO TABS
ORAL_TABLET | ORAL | 1 refills | Status: DC
Start: 2021-07-11 — End: 2022-01-03

## 2021-07-11 NOTE — Addendum Note (Signed)
Addended by: Dionicio Stall on: 07/11/2021 09:21 AM   Modules accepted: Orders

## 2021-07-11 NOTE — Telephone Encounter (Signed)
PATIENT NEEDS REFILL TO [facility] ON [address] FOR ATENOLOL.

## 2021-07-19 ENCOUNTER — Other Ambulatory Visit: Payer: Self-pay

## 2021-07-19 ENCOUNTER — Ambulatory Visit: Payer: BC Managed Care – PPO | Admitting: Internal Medicine

## 2021-07-19 ENCOUNTER — Encounter: Payer: Self-pay | Admitting: Internal Medicine

## 2021-07-19 VITALS — BP 160/90 | HR 79 | Temp 97.9°F | Resp 18 | Ht 61.0 in | Wt 151.8 lb

## 2021-07-19 DIAGNOSIS — H6691 Otitis media, unspecified, right ear: Secondary | ICD-10-CM

## 2021-07-19 DIAGNOSIS — R300 Dysuria: Secondary | ICD-10-CM | POA: Diagnosis not present

## 2021-07-19 DIAGNOSIS — H10021 Other mucopurulent conjunctivitis, right eye: Secondary | ICD-10-CM

## 2021-07-19 MED ORDER — DEXAMETHASONE 4 MG PO TABS: ORAL_TABLET | ORAL | 0 refills | Status: DC

## 2021-07-19 MED ORDER — LEVOFLOXACIN 500 MG PO TABS: ORAL_TABLET | ORAL | 1 refills | Status: DC

## 2021-07-19 MED ORDER — PSEUDOEPHEDRINE HCL ER 120 MG PO TB12: ORAL_TABLET | ORAL | 3 refills | Status: DC

## 2021-07-19 MED ORDER — NEOMYCIN-POLYMYXIN-DEXAMETH 3.5-10000-0.1 OP SUSP
OPHTHALMIC | 1 refills | Status: DC
Start: 2021-07-19 — End: 2021-09-06

## 2021-07-19 NOTE — Progress Notes (Signed)
? ? ?Future Appointments  ?Date Time Provider Department  ?09/06/2021 11:00 AM Magda Bernheim, NP [facility]  ?12/05/2021 11:00 AM Unk Pinto, MD [facility]  ?04/05/2022  4:00 PM Tamela Gammon, NP [facility]  ? ? ?History of Present Illness: ? ?   Patient relates treated 3 weeks ago at Urgent care with g Augmentin for Rt ear infection & sinusitis . Initially improved , but over last week has again developed pressure sensation in Rt ear & last 24 hours has developed  Rt "pinkeye"  awakening with crusting of the Rt eye. Also today has developed sx's of dysuria.  ? ?Medications ? ?Current Outpatient Medications (Endocrine & Metabolic):  ?  glipiZIDE (GLUCOTROL) 5 MG tablet, Take  1/2 to 1 tablet  2 x /day  with Meals  for High Blood Sugar ?  levothyroxine (SYNTHROID) 112 MCG tablet, 1 TAB DAILY ON EMPTY STOMACH W/ ONLY H2O X 30MINS & NO ANTACID MEDS, CALCIUM/MAGNESIUM X 4HRS & AVOID BIOTIN ?  metFORMIN (GLUCOPHAGE-XR) 500 MG 24 hr tablet, Take  2 tablets  2 x /day  with Meals  for Diabetes ? ?Current Outpatient Medications (Cardiovascular):  ?  atenolol (TENORMIN) 100 MG tablet, Take one tablet daily for blood pressure. ?  fenofibrate micronized (LOFIBRA) 134 MG capsule, TAKE 1 CAPSULE DAILY FOR TRIGLYCERIDES (BLOOD FATS) ?  hydrochlorothiazide (HYDRODIURIL) 25 MG tablet, TAKE 1 TABLET EVERY MORNING FOR BLOOD PRESSURE & FLUID RETENTION / ANKLE SWELLING ?  losartan (COZAAR) 100 MG tablet, TAKE 1 TABLET DAILY FOR BP & DIABETIC KIDNEY PROTECTION ?  rosuvastatin (CRESTOR) 10 MG tablet, Take 1 tablet Daily for Cholesterol ? ?Current Outpatient Medications (Respiratory):  ?  Pseudoephedrine-APAP-DM (DAYQUIL MULTI-SYMPTOM COLD/FLU PO), Take by mouth. (Patient not taking: Reported on 07/19/2021) ? ?Current Outpatient Medications (Analgesics):  ?  acetaminophen (TYLENOL) 500 MG tablet, Take 500 mg by mouth every 6 (six) hours as needed. ? ? ?Current Outpatient Medications (Other):  ?  ALPRAZolam (XANAX) 0.5 MG tablet, Take      1/2 to 1 tablet          2 to 3 x /day            as needed for Anxiety ?  Cholecalciferol 125 MCG (5000 UT) capsule, Take 5,000 Units by mouth daily. ?  glucose blood (ONETOUCH VERIO) test strip, Check blood sugar Daily  ( Dx:  e.11 ) ?  Lancets (ONETOUCH DELICA PLUS [id number]) MISC, USE TO TEST BLOOD SUGAR ONCE DAILY ? ?Problem list ?She has Essential hypertension; Hypothyroidism; Hyperlipidemia associated with type 2 diabetes mellitus ([place]); Irregular periods/menstrual cycles; Vitamin D deficiency; Medication management; BMI 28.0-28.9,adult; FHx: heart disease; Type 2 diabetes mellitus with stage 2 chronic kidney disease, without long-term current use of insulin ([place]); and Hepatic steatosis on their problem list. ?  ?Observations/Objective: ? ?BP (!) 160/90   Pulse 79   Temp 97.9 ?F (36.6 ?C)   Resp 18   Ht 5\' 1"  (1.549 m)   Wt 151 lb 12.8 oz (68.9 kg)   LMP 09/06/2016   SpO2 99%   BMI 28.68 kg/m?  ? ?HEENT - Lt EAC / TM - Nl. Rt EAC clear, but TM dull pink & retracted.  ?Rt conjunctiva 2+ injected with sl mucopurulent exudate.  N/O/P - clear.  ?Neck - supple.  ?Chest - Clear equal BS. ?Cor - Nl HS. RRR w/o sig MGR. PP 1(+). No edema. ?MS- FROM w/o deformities.  Gait Nl. ?Neuro -  Nl w/o focal abnormalities. ? ? ?Assessment  and Plan: ? ?1. OM (otitis media), recurrent, right ? ?- levofloxacin 500 MG tablet;  ?Take 1 tablet daily with food for infection   ?Dispense: 15 tablet; Refill: 1 ? ?- dexamethasone 4 MG tablet;  ?Take 1 tab 3 x day - 3 days, then 2 x day - 3 days, then 1 tab daily   ?Dispense: 20 tablet ? ?- pseudoephedrine  120 MG 12 hr tablet;  ?Take  1 tablet  2 x /day (every 12 hours)  ? Dispense: 60 tablet; Refill: 3 ? ?2. Other mucopurulent conjunctivitis of right eye ? ?- MAXITROL  SUSP;  ?Use 1 to 2 drops to affected eye for infection 4 x /day   ?Dispense: 5 mL; Refill: 1 ? ?3. Dysuria ? ?- Urinalysis, Routine w reflex microscopic ?- Urine Culture ? ?-  Pending C&S - >> ?levofloxacin 500  MG tablet;  ?Take 1 tablet daily with food for infection   ?Dispense: 15 tablet; Refill: 1 ? ? ? ?Follow Up Instructions: ? ?  ?    I discussed the assessment and treatment plan with the patient. The patient was provided an opportunity to ask questions and all were answered. The patient agreed with the plan and demonstrated an understanding of the instructions. ?  ?    The patient was advised to call back or seek an in-person evaluation if the symptoms worsen or if the condition fails to improve as anticipated. ? ? ? ?Kirtland Bouchard, MD ? ?

## 2021-07-21 ENCOUNTER — Encounter: Payer: Self-pay | Admitting: Internal Medicine

## 2021-07-22 LAB — MICROSCOPIC MESSAGE

## 2021-07-22 LAB — URINALYSIS, ROUTINE W REFLEX MICROSCOPIC
Bilirubin Urine: NEGATIVE
Glucose, UA: NEGATIVE
Hyaline Cast: NONE SEEN /LPF
Ketones, ur: NEGATIVE
Nitrite: NEGATIVE
Protein, ur: NEGATIVE
Specific Gravity, Urine: 1.004 (ref 1.001–1.035)
Squamous Epithelial / HPF: NONE SEEN /HPF (ref <=5)
pH: 5.5 (ref 5.0–8.0)

## 2021-07-22 LAB — URINE CULTURE
MICRO NUMBER:: 13098793
SPECIMEN QUALITY:: ADEQUATE

## 2021-07-22 NOTE — Progress Notes (Signed)
<><><><><><><><><><><><><><><><><><><><><><><><><><><><><><><><><> ?<><><><><><><><><><><><><><><><><><><><><><><><><><><><><><><><><> ?-   Test results slightly outside the reference range are not unusual. ?If there is anything important, I will review this with you,  ?otherwise it is considered normal test values.  ?If you have further questions,  ?please do not hesitate to contact me at the office or via My Chart.  ?<><><><><><><><><><><><><><><><><><><><><><><><><><><><><><><><><> ?<><><><><><><><><><><><><><><><><><><><><><><><><><><><><><><><><> ? ?-  Urine Culture unfortunately did return (+) Positive for infection, But Fortunately . . . .  ? ?- It tested very sensitive to the Levaquin / levofloxacin that  the you were prescribed .  ? ?So please finish the antibiotic &  ? ?you will need to have a repeat  follow-up urine culture in about a month  ?                                                                     to assure the infection is totally cleared up.  ?<><><><><><><><><><><><><><><><><><><><><><><><><><><><><><><><><> ?<><><><><><><><><><><><><><><><><><><><><><><><><><><><><><><><><> ? ? ?

## 2021-07-26 ENCOUNTER — Other Ambulatory Visit: Payer: Self-pay | Admitting: Internal Medicine

## 2021-07-27 ENCOUNTER — Other Ambulatory Visit: Payer: Self-pay | Admitting: Internal Medicine

## 2021-07-27 ENCOUNTER — Encounter: Payer: Self-pay | Admitting: Internal Medicine

## 2021-07-27 DIAGNOSIS — M25562 Pain in left knee: Secondary | ICD-10-CM

## 2021-08-09 DIAGNOSIS — M1712 Unilateral primary osteoarthritis, left knee: Secondary | ICD-10-CM | POA: Diagnosis not present

## 2021-08-28 ENCOUNTER — Other Ambulatory Visit: Payer: Self-pay | Admitting: Internal Medicine

## 2021-08-28 DIAGNOSIS — E1122 Type 2 diabetes mellitus with diabetic chronic kidney disease: Secondary | ICD-10-CM

## 2021-09-05 NOTE — Progress Notes (Signed)
?FOLLOW UP 3 MONTH ? ?Assessment and Plan:  ?  ?Hypertension ?Taking losartan 100mg  night, atenolol 100mg  daily, HCTZ 25mg  tab only; increase to whole tab ?Hx of intolerance with olmesartan, lisinopril  ?Continue atenolol, HCTZ ?Recheck in 2-4 weeks ?Monitor blood pressure at home; patient to call if consistently greater than 130/80 ?Continue DASH diet.   ?Reminder to go to the ER if any CP, SOB, nausea, dizziness, severe HA, changes vision/speech, left arm numbness and tingling and jaw pain. ?CBC ? ?Hyperlipidemia associated with Type 2 Diabetes Mellitus(HCC) ?Taking fenofibrate 145mg  daily and rosuvastatin 20mg    ?LDL goal <70 for T2DM, prescribed rosuvastatin but not taking; discussed at length and agrees to start; stop fenofibrate and do rosuvastatin only if any muscle aches ?Continue low cholesterol diet and exercise.  ?Check lipid panel.  ?CMP ? ?Type 2 Diabetes mellitus associated with Stage 2 CKD(HCC) ?Metformin 500 mg TID and doing well   ?Continue diet and exercise.  ?Perform daily foot/skin check, notify office of any concerning changes.  ?Check A1C ? ?Stage 2 CKD associated with Type 2 Diabetes Mellitus(HCC) ?Increase fluids, avoid NSAIDS, monitor sugars, will monitor  ?-CMP ? ?Overweight-BMI 28.35 ?Long discussion about weight loss, diet, and exercise ?Recommended diet heavy in fruits and veggies and low in animal meats, cheeses, and dairy products, appropriate calorie intake ?Discussed ideal weight for height  ?Will follow up in 3 months ? ?Right otitis media ?Amoxicillin 400 mg/4ml TID x 10 days ?If symptoms do not resolve notify the office ? ?Hypothyroidism ?Taking levothyroxine 112 mcg daily ?Reminder to take on an empty stomach 30-28mins before first meal of the day. ?No antacid medications for 4 hours. ?TSH ? ?Anxiety ?Currently only using Xanax if she flys ?Well controlled with behavior modification ? ?Vitamin D Def ?Below goal at last visit; she has initiated supplement ?Encouraged daily  adherence ?goal of 60-100 ? ? ?Medication Management ?Continued ? ?Continue diet and meds as discussed. Further disposition pending results of labs. Discussed med's effects and SE's.   ?Over 30 minutes of face to face interview, exam, counseling, chart review, and critical decision making was performed.  ? ?Future Appointments  ?Date Time Provider [place]  ?12/05/2021 11:00 AM Unk Pinto, MD [facility] None  ?04/05/2022  4:00 PM Tamela Gammon, NP [facility] None  ? ? ?---------------------------------------------------------------------------------------------------------------------- ? ?HPI ?56 y.o. female  presents for 3 month follow up on HTN, HLD, T2DM, weight, hypothyroidism and vitamin D deficiency.  ? ?She has been having right ear pain and is wondering if she has an infection. ? ?She watches her 34 year old granddaughter. ? ?Reports she has had some difficulties sleeping.  Has not tried using Melatonin ? ?BMI is Body mass index is 28.53 kg/m?., she has been working on exercise, but admits not eating as healthy as she should.  Has been doing kickboxing.  ?Wt Readings from Last 3 Encounters:  ?09/06/21 151 lb (68.5 kg)  ?07/19/21 151 lb 12.8 oz (68.9 kg)  ?05/31/21 154 lb 6.4 oz (70 kg)  ? ?Didn't tolerate olmesartan, unsure of reaction, lisinopril, dry cough ?Taking losartan 1 tab only, atenolol 100mg  QD , HCTZ 25 mg ?Her blood pressure has not been controlled at home (130-140s/70-80s), today their BP is BP: (!) 160/90 Was nervous today because car wouldn't start ?BP Readings from Last 3 Encounters:  ?09/06/21 (!) 160/90  ?07/19/21 (!) 160/90  ?05/31/21 (!) 180/90  ? ? ? She does workout. She denies chest pain, shortness of breath, dizziness. ? ? She is  on cholesterol medication , fenofibrate and rosuvastatin 10 mg daily and denies myalgias. Her cholesterol is not at goal. The cholesterol last visit was:   ?Lab Results  ?Component Value Date  ? CHOL 204 (H) 05/31/2021  ? HDL 35 (L) 05/31/2021   ? LDLCALC 121 (H) 05/31/2021  ? TRIG 334 (H) 05/31/2021  ? CHOLHDL 5.8 (H) 05/31/2021  ? ? She has been working on diet and exercise for T2DM  ?She is taking metformin 500 mg  Two tablets twice a day with meals  ?She denies foot ulcerations, increased appetite, nausea, paresthesia of the feet, polydipsia, polyuria, visual disturbances, vomiting and weight loss.  ?She checks fasting glucose 130-140s. ?Last A1C in the office was:  ?Lab Results  ?Component Value Date  ? HGBA1C 6.9 (H) 05/31/2021  ? ?Stage 2 CKD associated with T2DM, on losartan  ?Lab Results  ?Component Value Date  ? GFRNONAA 65 08/11/2020  ? ?She is on thyroid medication. Her medication was not changed last visit.  Taking 112 mcg daily  ?Lab Results  ?Component Value Date  ? TSH 1.06 05/31/2021  ?Marland Kitchen ?Patient is on Vitamin D supplement, taking 5000 IU maybe every other day  ?Lab Results  ?Component Value Date  ? VD25OH 34 05/31/2021  ?   ? ? ?Current Medications:  ?Current Outpatient Medications on File Prior to Visit  ?Medication Sig  ? acetaminophen (TYLENOL) 500 MG tablet Take 500 mg by mouth every 6 (six) hours as needed.  ? ALPRAZolam (XANAX) 0.5 MG tablet Take     1/2 to 1 tablet          2 to 3 x /day            as needed for Anxiety  ? atenolol (TENORMIN) 100 MG tablet Take one tablet daily for blood pressure.  ? Cholecalciferol 125 MCG (5000 UT) capsule Take 5,000 Units by mouth daily.  ? fenofibrate micronized (LOFIBRA) 134 MG capsule TAKE 1 CAPSULE DAILY FOR TRIGLYCERIDES (BLOOD FATS)  ? hydrochlorothiazide (HYDRODIURIL) 25 MG tablet TAKE 1 TABLET EVERY MORNING FOR BLOOD PRESSURE & FLUID RETENTION / ANKLE SWELLING  ? levothyroxine (SYNTHROID) 112 MCG tablet 1 TAB DAILY ON EMPTY STOMACH W/ ONLY H2O X & NO ANTACID MEDS, CALCIUM/MAGNESIUM X 4HRS & AVOID BIOTIN  ? losartan (COZAAR) 100 MG tablet TAKE 1 TABLET DAILY FOR BP & DIABETIC KIDNEY PROTECTION  ? metFORMIN (GLUCOPHAGE-XR) 500 MG 24 hr tablet TAKE 2 TABLETS 2 X /DAY WITH MEALS FOR  DIABETES  ? rosuvastatin (CRESTOR) 10 MG tablet Take 1 tablet Daily for Cholesterol  ? glipiZIDE (GLUCOTROL) 5 MG tablet TAKE 1/2 TO 1 TABLET 2 X /DAY WITH MEALS FOR HIGH BLOOD SUGAR (Patient not taking: Reported on 09/06/2021)  ? glucose blood (ONETOUCH VERIO) test strip Check blood sugar Daily  ( Dx:  e.11 )  ? Lancets (ONETOUCH DELICA PLUS LANCET30G) MISC USE TO TEST BLOOD SUGAR ONCE DAILY  ? ?No current facility-administered medications on file prior to visit.  ? ? ? ?Allergies:  ?Allergies  ?Allergen Reactions  ? Codeine   ? Ppd [Tuberculin Purified Protein Derivative]   ?  Positive reaction 2001  ? Lisinopril   ?  Dry cough  ?  ? ?Medical History:  ?Past Medical History:  ?Diagnosis Date  ? Hypertension   ? Hypothyroidism   ? Splenomegaly 02/14/2019  ? 02/14/2019 abd [place]: Spleen measures 14.1 x 15.6 x 4.7 cm with a measured splenic volume of 542 cubic cm. No focal splenic  lesions are evident.  ? ?Allergies ?Allergies  ?Allergen Reactions  ? Codeine   ? Ppd [Tuberculin Purified Protein Derivative]   ?  Positive reaction 2001  ? Lisinopril   ?  Dry cough  ? ? ?SURGICAL HISTORY ?She  has a past surgical history that includes Lymph node dissection. ?FAMILY HISTORY ?Her family history includes Cancer in her mother; Diabetes in her mother; Heart disease in her father; Hyperlipidemia in her mother; Hypertension in her father and mother. ?SOCIAL HISTORY ?She  reports that she has never smoked. She has never used smokeless tobacco. She reports current alcohol use. She reports that she does not use drugs. ? ? ?Review of Systems:  ?Review of Systems  ?Constitutional:  Negative for chills, fever, malaise/fatigue and weight loss.  ?HENT:  Positive for ear pain (right). Negative for congestion, hearing loss and tinnitus.   ?Eyes:  Negative for blurred vision and double vision.  ?Respiratory:  Negative for cough, shortness of breath and wheezing.   ?Cardiovascular:  Negative for chest pain, palpitations, orthopnea, claudication  and leg swelling.  ?Gastrointestinal:  Negative for abdominal pain, blood in stool, constipation, diarrhea, heartburn, melena, nausea and vomiting.  ?Genitourinary: Negative.   ?Musculoskeletal:  Positive fo

## 2021-09-06 ENCOUNTER — Ambulatory Visit: Payer: BC Managed Care – PPO | Admitting: Nurse Practitioner

## 2021-09-06 ENCOUNTER — Encounter: Payer: Self-pay | Admitting: Nurse Practitioner

## 2021-09-06 VITALS — BP 160/90 | HR 64 | Temp 97.5°F | Wt 151.0 lb

## 2021-09-06 DIAGNOSIS — F419 Anxiety disorder, unspecified: Secondary | ICD-10-CM

## 2021-09-06 DIAGNOSIS — E1122 Type 2 diabetes mellitus with diabetic chronic kidney disease: Secondary | ICD-10-CM

## 2021-09-06 DIAGNOSIS — Z79899 Other long term (current) drug therapy: Secondary | ICD-10-CM

## 2021-09-06 DIAGNOSIS — E663 Overweight: Secondary | ICD-10-CM

## 2021-09-06 DIAGNOSIS — E785 Hyperlipidemia, unspecified: Secondary | ICD-10-CM

## 2021-09-06 DIAGNOSIS — I10 Essential (primary) hypertension: Secondary | ICD-10-CM | POA: Diagnosis not present

## 2021-09-06 DIAGNOSIS — E559 Vitamin D deficiency, unspecified: Secondary | ICD-10-CM | POA: Diagnosis not present

## 2021-09-06 DIAGNOSIS — H6591 Unspecified nonsuppurative otitis media, right ear: Secondary | ICD-10-CM

## 2021-09-06 DIAGNOSIS — E039 Hypothyroidism, unspecified: Secondary | ICD-10-CM

## 2021-09-06 DIAGNOSIS — N182 Chronic kidney disease, stage 2 (mild): Secondary | ICD-10-CM

## 2021-09-06 DIAGNOSIS — E1169 Type 2 diabetes mellitus with other specified complication: Secondary | ICD-10-CM

## 2021-09-06 MED ORDER — AMOXICILLIN 400 MG/5ML PO SUSR: 400.0000 mg | Freq: Three times a day (TID) | ORAL | 0 refills | Status: DC

## 2021-09-06 MED ORDER — ALPRAZOLAM 0.5 MG PO TABS: ORAL_TABLET | ORAL | 0 refills | Status: DC

## 2021-09-07 LAB — COMPLETE METABOLIC PANEL WITH GFR
AG Ratio: 1.7 (calc) (ref 1.0–2.5)
ALT: 23 U/L (ref 6–29)
AST: 20 U/L (ref 10–35)
Albumin: 4.8 g/dL (ref 3.6–5.1)
Alkaline phosphatase (APISO): 36 U/L — ABNORMAL LOW (ref 37–153)
BUN: 15 mg/dL (ref 7–25)
CO2: 29 mmol/L (ref 20–32)
Calcium: 10.4 mg/dL (ref 8.6–10.4)
Chloride: 101 mmol/L (ref 98–110)
Creat: 0.91 mg/dL (ref 0.50–1.03)
Globulin: 2.9 g/dL (calc) (ref 1.9–3.7)
Glucose, Bld: 141 mg/dL — ABNORMAL HIGH (ref 65–99)
Potassium: 4.3 mmol/L (ref 3.5–5.3)
Sodium: 139 mmol/L (ref 135–146)
Total Bilirubin: 0.6 mg/dL (ref 0.2–1.2)
Total Protein: 7.7 g/dL (ref 6.1–8.1)
eGFR: 75 mL/min/{1.73_m2} (ref >=60)

## 2021-09-07 LAB — TSH: TSH: 1.18 mIU/L

## 2021-09-07 LAB — CBC WITH DIFFERENTIAL/PLATELET
Absolute Monocytes: 641 cells/uL (ref 200–950)
Basophils Absolute: 45 cells/uL (ref 0–200)
Basophils Relative: 0.5 %
Eosinophils Absolute: 285 cells/uL (ref 15–500)
Eosinophils Relative: 3.2 %
HCT: 39.2 % (ref 35.0–45.0)
Hemoglobin: 12.9 g/dL (ref 11.7–15.5)
Lymphs Abs: 2608 cells/uL (ref 850–3900)
MCH: 28.3 pg (ref 27.0–33.0)
MCHC: 32.9 g/dL (ref 32.0–36.0)
MCV: 86 fL (ref 80.0–100.0)
MPV: 10 fL (ref 7.5–12.5)
Monocytes Relative: 7.2 %
Neutro Abs: 5322 cells/uL (ref 1500–7800)
Neutrophils Relative %: 59.8 %
Platelets: 325 10*3/uL (ref 140–400)
RBC: 4.56 10*6/uL (ref 3.80–5.10)
RDW: 14 % (ref 11.0–15.0)
Total Lymphocyte: 29.3 %
WBC: 8.9 10*3/uL (ref 3.8–10.8)

## 2021-09-07 LAB — LIPID PANEL
Cholesterol: 173 mg/dL (ref <200)
HDL: 35 mg/dL — ABNORMAL LOW (ref >=50)
LDL Cholesterol (Calc): 93 mg/dL (calc)
Non-HDL Cholesterol (Calc): 138 mg/dL (calc) — ABNORMAL HIGH (ref <130)
Total CHOL/HDL Ratio: 4.9 (calc) (ref <5.0)
Triglycerides: 337 mg/dL — ABNORMAL HIGH (ref <150)

## 2021-09-07 LAB — HEMOGLOBIN A1C
Hgb A1c MFr Bld: 7.1 % of total Hgb — ABNORMAL HIGH (ref <5.7)
Mean Plasma Glucose: 157 mg/dL
eAG (mmol/L): 8.7 mmol/L

## 2021-10-13 ENCOUNTER — Encounter: Payer: Self-pay | Admitting: Internal Medicine

## 2021-11-30 ENCOUNTER — Encounter: Payer: BC Managed Care – PPO | Admitting: Internal Medicine

## 2021-12-04 ENCOUNTER — Encounter: Payer: Self-pay | Admitting: Internal Medicine

## 2021-12-04 NOTE — Progress Notes (Signed)
Annual Screening/Preventative Visit & Comprehensive Evaluation &  Examination  Future Appointments  Date Time Provider Department  12/05/2021 11:00 AM Unk Pinto, MD [facility]  04/05/2022  4:00 PM Tamela Gammon, NP [facility]  12/11/2022 11:00 AM Unk Pinto, MD [facility]        This very nice 56 y.o. MWF presents for a Screening /Preventative Visit & comprehensive evaluation and management of multiple medical co-morbidities.  Patient has been followed for HTN, HLD, T2_NIDDM  and Vitamin D Deficiency.        HTN predates circa 2000. Patient's BP has been controlled at home and patient denies any cardiac symptoms as chest pain, palpitations, shortness of breath, dizziness or ankle swelling. Today's BP is at goal - 130/80 .        Patient's hyperlipidemia is not controlled with diet and patient has been resistant to take recommended meds for Cholesterol.  Last lipids were at goal  except elevated Trig's :  Lab Results  Component Value Date   CHOL 173 09/06/2021   HDL 35 (L) 09/06/2021   LDLCALC 93 09/06/2021   TRIG 337 (H) 09/06/2021   CHOLHDL 4.9 09/06/2021        Patient has hx/o T2_NIDDM (A1c 6.5% /2019) w/CKD2 (GFR 75) and patient is on Metformin & denies reactive hypoglycemic symptoms, visual blurring, diabetic polys or paresthesias. Last A1c was not at goal  :  Lab Results  Component Value Date   HGBA1C 7.1 (H) 09/06/2021                                            Patient has been on thyroid  replacement  since  dx'd Hypothyroid in 1997.                                                         Finally, patient has history of Vitamin D Deficiency ("20" /2013 & "16" /2014) and last Vitamin D was  still very low :  Lab Results  Component Value Date   VD25OH 34 05/31/2021      Current Outpatient Medications on File Prior to Visit  Medication Sig   ALPRAZolam  0.5 MG tablet Take 1/2 to 1 tablet  2 to 3 x/day  as needed for Anxiety   atenolol  100 MG  tablet TAKE 1 TABLET EVERY DAY    Cholecalciferol 5,000 u Take 5,000 Units  daily.   fenofibrate  134 MG capsule TAKE 1 CAPSULE DAILY    hydrochlorothiazide 25 MG tablet TAKE 1 TABLET EVERY MORNING   levothyroxine 112 MCG tablet TAKE 1 TAB DAILY    Losartan 100 MG tablet TAKE 1 TABLET DAILY    metFORMIN -XR 500 MG 24 hr tablet Take  2 tablets  2 x /day  with Meals  for Diabetes   Rosuvastatin  20 MG tablet Take 1 tablet daily for Cholesterol    Allergies  Allergen Reactions   Codeine    Ppd [Tuberculin Purified Protein Derivative]     Positive reaction 2001   Lisinopril     Dry cough     Past Medical History:  Diagnosis Date   Hypertension    Hypothyroidism    Splenomegaly  02/14/2019   02/14/2019 abd [place]: Spleen measures 14.1 x 15.6 x 4.7 cm with a measured splenic volume of 542 cubic cm. No focal splenic lesions are evident.    Health Maintenance  Topic Date Due   PNEUMOCOCCAL POLYSACCHARIDE VACCINE AGE 55-64  Never done   Pneumococcal Vaccine 74-72 Years old  Never done   HIV Screening  Never done   Zoster Vaccines- Shingrix (1 of 2) Never done   OPHTHALMOLOGY EXAM  02/16/2019   PAP SMEAR-Modifier  03/28/2019   MAMMOGRAM  05/25/2020   COVID-19 Vaccine (4 - Booster for Pfizer series) 08/09/2020   FOOT EXAM  11/08/2020   INFLUENZA VACCINE  12/13/2020   HEMOGLOBIN A1C  02/11/2021   TETANUS/TDAP  08/01/2022   Fecal DNA (Cologuard)  11/29/2022   Hepatitis C Screening  Completed   HPV VACCINES  Aged Out    Immunization History  Administered Date(s) Administered   Influenza Inj Mdck Quad With Preservative 03/22/2020   Influenza,inj,Quad PF,6+ Mos 04/01/2019   PFIZER(Purple Top)SARS-COV-2 Vaccination 08/23/2019, 09/13/2019, 05/11/2020   Td 06/29/2001, 07/31/2012   Tdap 07/31/2012    Last Colon - Declines Colonoscopy &  Cologard  11/29/2019 - Negative - Recc 3 year f/u due July 2024  Last MGM - g0/05/22 at GYN office  - Maryelizabeth Rowan , NP   Past Surgical History:   Procedure Laterality Date   LYMPH NODE DISSECTION     NECK- BENIGN    Family History  Problem Relation Age of Onset   Cancer Mother        NON HODGKINS LYMPHOMA   Hypertension Mother    Diabetes Mother    Hyperlipidemia Mother    Hypertension Father    Heart disease Father     Social History   Tobacco Use   Smoking status: Never   Smokeless tobacco: Never  Vaping Use   Vaping Use: Never used  Substance Use Topics   Alcohol use: Yes    Comment: occ.   Drug use: No      ROS Constitutional: Denies fever, chills, weight loss/gain, headaches, insomnia,  night sweats, and change in appetite. Does c/o fatigue. Eyes: Denies redness, blurred vision, diplopia, discharge, itchy, watery eyes.  ENT: Denies discharge, congestion, post nasal drip, epistaxis, sore throat, earache, hearing loss, dental pain, Tinnitus, Vertigo, Sinus pain, snoring.  Cardio: Denies chest pain, palpitations, irregular heartbeat, syncope, dyspnea, diaphoresis, orthopnea, PND, claudication, edema Respiratory: denies cough, dyspnea, DOE, pleurisy, hoarseness, laryngitis, wheezing.  Gastrointestinal: Denies dysphagia, heartburn, reflux, water brash, pain, cramps, nausea, vomiting, bloating, diarrhea, constipation, hematemesis, melena, hematochezia, jaundice, hemorrhoids Genitourinary: Denies dysuria, frequency, urgency, nocturia, hesitancy, discharge, hematuria, flank pain Breast: Breast lumps, nipple discharge, bleeding.  Musculoskeletal: Denies arthralgia, myalgia, stiffness, Jt. Swelling, pain, limp, and strain/sprain. Denies falls. Skin: Denies puritis, rash, hives, warts, acne, eczema, changing in skin lesion Neuro: No weakness, tremor, incoordination, spasms, paresthesia, pain Psychiatric: Denies confusion, memory loss, sensory loss. Denies Depression. Endocrine: Denies change in weight, skin, hair change, nocturia, and paresthesia, diabetic polys, visual blurring, hyper / hypo glycemic episodes.   Heme/Lymph: No excessive bleeding, bruising, enlarged lymph nodes.  Physical Exam  LMP 09/06/2016   General Appearance: Well nourished, well groomed and in no apparent distress.  Eyes: PERRLA, EOMs, conjunctiva no swelling or erythema, normal fundi and vessels. Sinuses: No frontal/maxillary tenderness ENT/Mouth: EACs patent / TMs  nl. Nares clear without erythema, swelling, mucoid exudates. Oral hygiene is good. No erythema, swelling, or exudate. Tongue normal, non-obstructing. Tonsils not swollen or erythematous.  Hearing normal.  Neck: Supple, thyroid not palpable. No bruits, nodes or JVD. Respiratory: Respiratory effort normal.  BS equal and clear bilateral without rales, rhonci, wheezing or stridor. Cardio: Heart sounds are normal with regular rate and rhythm and no murmurs, rubs or gallops. Peripheral pulses are normal and equal bilaterally without edema. No aortic or femoral bruits. Chest: symmetric with normal excursions and percussion. Breasts: Symmetric, without lumps, nipple discharge, retractions, or fibrocystic changes.  Abdomen: Flat, soft with bowel sounds active. Nontender, no guarding, rebound, hernias, masses, or organomegaly.  Lymphatics: Non tender without lymphadenopathy.  Genitourinary:  Musculoskeletal: Full ROM all peripheral extremities, joint stability, 5/5 strength, and normal gait. Skin: Warm and dry without rashes, lesions, cyanosis, clubbing or  ecchymosis.  Neuro: Cranial nerves intact, reflexes equal bilaterally. Normal muscle tone, no cerebellar symptoms. Sensation intact.  Pysch: Alert and oriented X 3, normal affect, Insight and Judgment appropriate.    Assessment and Plan  1. Annual Preventative Screening Examination   2. Essential hypertension  - EKG 12-Lead - [place], RETROPERITNL ABD,  LTD - Urinalysis, Routine w reflex microscopic - Microalbumin / creatinine urine ratio - CBC with Differential/Platelet - COMPLETE METABOLIC PANEL WITH GFR -  Magnesium - TSH  3. Hyperlipidemia associated with type 2 diabetes mellitus (HCC)  - EKG 12-Lead - [place], RETROPERITNL ABD,  LTD - Lipid panel - TSH  4. Type 2 diabetes mellitus with stage 2 chronic kidney  disease, without long-term current use of insulin (HCC)  - EKG 12-Lead - [place], RETROPERITNL ABD,  LTD - HM DIABETES FOOT EXAM - PR LOW EXTEMITY NEUR EXAM DOCUM - Hemoglobin A1c - Insulin, random  5. Vitamin D deficiency  - VITAMIN D 25 Hydroxy   6. Hypothyroidism - TSH  7. Screening for colorectal cancer  - POC Hemoccult Bld/Stl   8. Screening-pulmonary TB  - QuantiFERON-TB Gold Plus  9. Screening for heart disease  - EKG 12-Lead  10. FHx: heart disease  - EKG 12-Lead - [place], RETROPERITNL ABD,  LTD  11. Screening for AAA (aortic abdominal aneurysm)  - [place], RETROPERITNL ABD,  LTD  12. Fatigue, unspecified type  - Iron, Total/Total Iron Binding Cap - Vitamin B12  13. Medication management  - Urinalysis, Routine w reflex microscopic - Microalbumin / creatinine urine ratio - CBC with Differential/Platelet - COMPLETE METABOLIC PANEL WITH GFR - Magnesium - Lipid panel - TSH - Hemoglobin A1c - Insulin, random - VITAMIN D 25 Hydroxy         Patient was counseled in prudent diet to achieve/maintain BMI less than 25 for weight control, BP monitoring, regular exercise and medications. Discussed med's effects and SE's. Screening labs and tests as requested with regular follow-up as recommended. Over 40 minutes of exam, counseling, chart review and high complex critical decision making was performed.   Marinus Maw, MD

## 2021-12-04 NOTE — Patient Instructions (Signed)
Due to recent changes in healthcare laws, you may see the results of your imaging and laboratory studies on MyChart before your provider has had a chance to review them.  We understand that in some cases there may be results that are confusing or concerning to you. Not all laboratory results come back in the same time frame and the provider may be waiting for multiple results in order to interpret others.  Please give [place] 48 hours in order for your provider to thoroughly review all the results before contacting the office for clarification of your results.   ++++++++++++++++++++++++++++++  Vit D  & Vit C 1,000 mg   are recommended to help protect  against the Covid-19 and other Corona viruses.    Also it's recommended  to take  Zinc 50 mg  to help  protect against the Covid-19   and best place to get  is also on [email or web address]  and don't pay more than 6-8 cents /pill !  ================================ Coronavirus (COVID-19) Are you at risk?  Are you at risk for the Coronavirus (COVID-19)?  To be considered HIGH RISK for Coronavirus (COVID-19), you have to meet the following criteria:  Traveled to [place], [place], [place], [place] or [place]; or in the [place] to [place], [place]  or [place]; and have fever, cough, and shortness of breath within the last 2 weeks of travel OR Been in close contact with a person diagnosed with COVID-19 within the last 2 weeks and have  fever, cough,and shortness of breath  IF YOU DO NOT MEET THESE CRITERIA, YOU ARE CONSIDERED LOW RISK FOR COVID-19.  What to do if you are HIGH RISK for COVID-19?  If you are having a medical emergency, call 911. Seek medical care right away. Before you go to a doctor's office, urgent care or emergency department,  call ahead and tell them about your recent travel, contact with someone diagnosed with COVID-19   and your symptoms.  You should receive instructions from your physician's office regarding  next steps of care.  When you arrive at healthcare provider, tell the healthcare staff immediately you have returned from  visiting [place], [place], [place], [place] or [place]; or traveled in the [place] to [place], [place],  [place] or [place] in the last two weeks or you have been in close contact with a person diagnosed with  COVID-19 in the last 2 weeks.   Tell the health care staff about your symptoms: fever, cough and shortness of breath. After you have been seen by a medical provider, you will be either: Tested for (COVID-19) and discharged home on quarantine except to seek medical care if  symptoms worsen, and asked to  Stay home and avoid contact with others until you get your results (4-5 days)  Avoid travel on public transportation if possible (such as bus, train, or airplane) or Sent to the Emergency Department by EMS for evaluation, COVID-19 testing  and  possible admission depending on your condition and test results.  What to do if you are LOW RISK for COVID-19?  Reduce your risk of any infection by using the same precautions used for avoiding the common cold or flu:  Wash your hands often with soap and warm water for at least 20 seconds.  If soap and water are not readily available,  use an alcohol-based hand sanitizer with at least 60% alcohol.  If coughing or sneezing, cover your mouth and nose by coughing  or sneezing into the elbow areas of your shirt or coat,  into a tissue or into your sleeve (not your hands). Avoid shaking hands with others and consider head nods or verbal greetings only. Avoid touching your eyes, nose, or mouth with unwashed hands.  Avoid close contact with people who are sick. Avoid places or events with large numbers of people in one location, like concerts or sporting events. Carefully consider travel plans you have or are making. If you are planning any travel outside or inside the [place], visit the CDC's Travelers' Health webpage for  the latest health notices. If you have some symptoms but not all symptoms, continue to monitor at home and seek medical attention  if your symptoms worsen. If you are having a medical emergency, call 911. >>>>>>>>>>>>>>>>>>>>>>> Preventive Care for Adults  A healthy lifestyle and preventive care can promote health and wellness. Preventive health guidelines for women include the following key practices. A routine yearly physical is a good way to check with your health care provider about your health and preventive screening. It is a chance to share any concerns and updates on your health and to receive a thorough exam. Visit your dentist for a routine exam and preventive care every 6 months. Brush your teeth twice a day and floss once a day. Good oral hygiene prevents tooth decay and gum disease. The frequency of eye exams is based on your age, health, family medical history, use of contact lenses, and other factors. Follow your health care provider's recommendations for frequency of eye exams. Eat a healthy diet. Foods like vegetables, fruits, whole grains, low-fat dairy products, and lean protein foods contain the nutrients you need without too many calories. Decrease your intake of foods high in solid fats, added sugars, and salt. Eat the right amount of calories for you. Get information about a proper diet from your health care provider, if necessary. Regular physical exercise is one of the most important things you can do for your health. Most adults should get at least 150 minutes of moderate-intensity exercise (any activity that increases your heart rate and causes you to sweat) each week. In addition, most adults need muscle-strengthening exercises on 2 or more days a week. Maintain a healthy weight. The body mass index (BMI) is a screening tool to identify possible weight problems. It provides an estimate of body fat based on height and weight. Your health care provider can find your BMI and can  help you achieve or maintain a healthy weight. For adults 20 years and older: A BMI below 18.5 is considered underweight. A BMI of 18.5 to 24.9 is normal. A BMI of 25 to 29.9 is considered overweight. A BMI of 30 and above is considered obese. Maintain normal blood lipids and cholesterol levels by exercising and minimizing your intake of saturated fat. Eat a balanced diet with plenty of fruit and vegetables. Blood tests for lipids and cholesterol should begin at age 37 and be repeated every 5 years. If your lipid or cholesterol levels are high, you are over 50, or you are at high risk for heart disease, you may need your cholesterol levels checked more frequently. Ongoing high lipid and cholesterol levels should be treated with medicines if diet and exercise are not working. If you smoke, find out from your health care provider how to quit. If you do not use tobacco, do not start. Lung cancer screening is recommended for adults aged 30-80 years who are at high risk for developing  lung cancer because of a history of smoking. A yearly low-dose CT scan of the lungs is recommended for people who have at least a 30-pack-year history of smoking and are a current smoker or have quit within the past 15 years. A pack year of smoking is smoking an average of 1 pack of cigarettes a day for 1 year (for example: 1 pack a day for 30 years or 2 packs a day for 15 years). Yearly screening should continue until the smoker has stopped smoking for at least 15 years. Yearly screening should be stopped for people who develop a health problem that would prevent them from having lung cancer treatment. High blood pressure causes heart disease and increases the risk of stroke. Your blood pressure should be checked at least every 1 to 2 years. Ongoing high blood pressure should be treated with medicines if weight loss and exercise do not work. If you are 32-3 years old, ask your health care provider if you should take aspirin to  prevent strokes. Diabetes screening involves taking a blood sample to check your fasting blood sugar level. This should be done once every 3 years, after age 32, if you are within normal weight and without risk factors for diabetes. Testing should be considered at a younger age or be carried out more frequently if you are overweight and have at least 1 risk factor for diabetes. Breast cancer screening is essential preventive care for women. You should practice "breast self-awareness." This means understanding the normal appearance and feel of your breasts and may include breast self-examination. Any changes detected, no matter how small, should be reported to a health care provider. Women in their 45s and 30s should have a clinical breast exam (CBE) by a health care provider as part of a regular health exam every 1 to 3 years. After age 83, women should have a CBE every year. Starting at age 92, women should consider having a mammogram (breast X-ray test) every year. Women who have a family history of breast cancer should talk to their health care provider about genetic screening. Women at a high risk of breast cancer should talk to their health care providers about having an MRI and a mammogram every year. Breast cancer gene (BRCA)-related cancer risk assessment is recommended for women who have family members with BRCA-related cancers. BRCA-related cancers include breast, ovarian, tubal, and peritoneal cancers. Having family members with these cancers may be associated with an increased risk for harmful changes (mutations) in the breast cancer genes BRCA1 and BRCA2. Results of the assessment will determine the need for genetic counseling and BRCA1 and BRCA2 testing. Routine pelvic exams to screen for cancer are no longer recommended for nonpregnant women who are considered low risk for cancer of the pelvic organs (ovaries, uterus, and vagina) and who do not have symptoms. Ask your health care provider if a  screening pelvic exam is right for you. If you have had past treatment for cervical cancer or a condition that could lead to cancer, you need Pap tests and screening for cancer for at least 20 years after your treatment. If Pap tests have been discontinued, your risk factors (such as having a new sexual partner) need to be reassessed to determine if screening should be resumed. Some women have medical problems that increase the chance of getting cervical cancer. In these cases, your health care provider may recommend more frequent screening and Pap tests. Colorectal cancer can be detected and often prevented. Most routine colorectal  cancer screening begins at the age of 59 years and continues through age 79 years. However, your health care provider may recommend screening at an earlier age if you have risk factors for colon cancer. On a yearly basis, your health care provider may provide home test kits to check for hidden blood in the stool. Use of a small camera at the end of a tube, to directly examine the colon (sigmoidoscopy or colonoscopy), can detect the earliest forms of colorectal cancer. Talk to your health care provider about this at age 89, when routine screening begins.  Direct exam of the colon should be repeated every 5-10 years through age 28 years, unless early forms of pre-cancerous polyps or small growths are found. Hepatitis C blood testing is recommended for all people born from [birth year] through 1965 and any individual with known risks for hepatitis C.  Osteoporosis is a disease in which the bones lose minerals and strength with aging. This can result in serious bone fractures or breaks. The risk of osteoporosis can be identified using a bone density scan. Women ages 53 years and over and women at risk for fractures or osteoporosis should discuss screening with their health care providers. Ask your health care provider whether you should take a calcium supplement or vitamin D to reduce the rate  of osteoporosis. Menopause can be associated with physical symptoms and risks. Hormone replacement therapy is available to decrease symptoms and risks. You should talk to your health care provider about whether hormone replacement therapy is right for you. Use sunscreen. Apply sunscreen liberally and repeatedly throughout the day. You should seek shade when your shadow is shorter than you. Protect yourself by wearing long sleeves, pants, a wide-brimmed hat, and sunglasses year round, whenever you are outdoors. Once a month, do a whole body skin exam, using a mirror to look at the skin on your back. Tell your health care provider of new moles, moles that have irregular borders, moles that are larger than a pencil eraser, or moles that have changed in shape or color. Stay current with required vaccines (immunizations). Influenza vaccine. All adults should be immunized every year. Tetanus, diphtheria, and acellular pertussis (Td, Tdap) vaccine. Pregnant women should receive 1 dose of Tdap vaccine during each pregnancy. The dose should be obtained regardless of the length of time since the last dose. Immunization is preferred during the 27th-36th week of gestation. An adult who has not previously received Tdap or who does not know her vaccine status should receive 1 dose of Tdap. This initial dose should be followed by tetanus and diphtheria toxoids (Td) booster doses every 10 years. Adults with an unknown or incomplete history of completing a 3-dose immunization series with Td-containing vaccines should begin or complete a primary immunization series including a Tdap dose. Adults should receive a Td booster every 10 years. Varicella vaccine. An adult without evidence of immunity to varicella should receive 2 doses or a second dose if she has previously received 1 dose. Pregnant females who do not have evidence of immunity should receive the first dose after pregnancy. This first dose should be obtained before  leaving the health care facility. The second dose should be obtained 4-8 weeks after the first dose. Human papillomavirus (HPV) vaccine. Females aged 13-26 years who have not received the vaccine previously should obtain the 3-dose series. The vaccine is not recommended for use in pregnant females. However, pregnancy testing is not needed before receiving a dose. If a female is found to  be pregnant after receiving a dose, no treatment is needed. In that case, the remaining doses should be delayed until after the pregnancy. Immunization is recommended for any person with an immunocompromised condition through the age of 55 years if she did not get any or all doses earlier. During the 3-dose series, the second dose should be obtained 4-8 weeks after the first dose. The third dose should be obtained 24 weeks after the first dose and 16 weeks after the second dose. Zoster vaccine. One dose is recommended for adults aged 15 years or older unless certain conditions are present. Measles, mumps, and rubella (MMR) vaccine. Adults born before [birth year] generally are considered immune to measles and mumps. Adults born in [birth year] or later should have 1 or more doses of MMR vaccine unless there is a contraindication to the vaccine or there is laboratory evidence of immunity to each of the three diseases. A routine second dose of MMR vaccine should be obtained at least 28 days after the first dose for students attending postsecondary schools, health care workers, or international travelers. People who received inactivated measles vaccine or an unknown type of measles vaccine during 1963-1967 should receive 2 doses of MMR vaccine. People who received inactivated mumps vaccine or an unknown type of mumps vaccine before 1979 and are at high risk for mumps infection should consider immunization with 2 doses of MMR vaccine. For females of childbearing age, rubella immunity should be determined. If there is no evidence of immunity, females  who are not pregnant should be vaccinated. If there is no evidence of immunity, females who are pregnant should delay immunization until after pregnancy. Unvaccinated health care workers born before [birth year] who lack laboratory evidence of measles, mumps, or rubella immunity or laboratory confirmation of disease should consider measles and mumps immunization with 2 doses of MMR vaccine or rubella immunization with 1 dose of MMR vaccine. Pneumococcal 13-valent conjugate (PCV13) vaccine. When indicated, a person who is uncertain of her immunization history and has no record of immunization should receive the PCV13 vaccine. An adult aged 4 years or older who has certain medical conditions and has not been previously immunized should receive 1 dose of PCV13 vaccine. This PCV13 should be followed with a dose of pneumococcal polysaccharide (PPSV23) vaccine. The PPSV23 vaccine dose should be obtained at least 1 or more year(s) after the dose of PCV13 vaccine. An adult aged 15 years or older who has certain medical conditions and previously received 1 or more doses of PPSV23 vaccine should receive 1 dose of PCV13. The PCV13 vaccine dose should be obtained 1 or more years after the last PPSV23 vaccine dose.  Pneumococcal polysaccharide (PPSV23) vaccine. When PCV13 is also indicated, PCV13 should be obtained first. All adults aged 9 years and older should be immunized. An adult younger than age 14 years who has certain medical conditions should be immunized. Any person who resides in a nursing home or long-term care facility should be immunized. An adult smoker should be immunized. People with an immunocompromised condition and certain other conditions should receive both PCV13 and PPSV23 vaccines. People with human immunodeficiency virus (HIV) infection should be immunized as soon as possible after diagnosis. Immunization during chemotherapy or radiation therapy should be avoided. Routine use of PPSV23 vaccine is not  recommended for American Indians, [place] Natives, or people younger than 65 years unless there are medical conditions that require PPSV23 vaccine. When indicated, people who have unknown immunization and have no record of immunization should receive  PPSV23 vaccine. One-time revaccination 5 years after the first dose of PPSV23 is recommended for people aged 19-64 years who have chronic kidney failure, nephrotic syndrome, asplenia, or immunocompromised conditions. People who received 1-2 doses of PPSV23 before age 15 years should receive another dose of PPSV23 vaccine at age 69 years or later if at least 5 years have passed since the previous dose. Doses of PPSV23 are not needed for people immunized with PPSV23 at or after age 108 years.  Preventive Services / Frequency  Ages 49 to 62 years Blood pressure check. Lipid and cholesterol check. Lung cancer screening. / Every year if you are aged 40-80 years and have a 30-pack-year history of smoking and currently smoke or have quit within the past 15 years. Yearly screening is stopped once you have quit smoking for at least 15 years or develop a health problem that would prevent you from having lung cancer treatment. Clinical breast exam.** / Every year after age 50 years.  BRCA-related cancer risk assessment.** / For women who have family members with a BRCA-related cancer (breast, ovarian, tubal, or peritoneal cancers). Mammogram.** / Every year beginning at age 12 years and continuing for as long as you are in good health. Consult with your health care provider. Pap test.** / Every 3 years starting at age 42 years through age 50 or 30 years with a history of 3 consecutive normal Pap tests. HPV screening.** / Every 3 years from ages 11 years through ages 88 to 47 years with a history of 3 consecutive normal Pap tests. Fecal occult blood test (FOBT) of stool. / Every year beginning at age 15 years and continuing until age 38 years. You may not need to do this  test if you get a colonoscopy every 10 years. Flexible sigmoidoscopy or colonoscopy.** / Every 5 years for a flexible sigmoidoscopy or every 10 years for a colonoscopy beginning at age 90 years and continuing until age 104 years. Hepatitis C blood test.** / For all people born from [birth year] through 1965 and any individual with known risks for hepatitis C. Skin self-exam. / Monthly. Influenza vaccine. / Every year. Tetanus, diphtheria, and acellular pertussis (Tdap/Td) vaccine.** / Consult your health care provider. Pregnant women should receive 1 dose of Tdap vaccine during each pregnancy. 1 dose of Td every 10 years. Varicella vaccine.** / Consult your health care provider. Pregnant females who do not have evidence of immunity should receive the first dose after pregnancy. Zoster vaccine.** / 1 dose for adults aged 81 years or older. Pneumococcal 13-valent conjugate (PCV13) vaccine.** / Consult your health care provider. Pneumococcal polysaccharide (PPSV23) vaccine.** / 1 to 2 doses if you smoke cigarettes or if you have certain conditions. Meningococcal vaccine.** / Consult your health care provider. Hepatitis A vaccine.** / Consult your health care provider. Hepatitis B vaccine.** / Consult your health care provider. Screening for abdominal aortic aneurysm (AAA)  by ultrasound is recommended for people over 50 who have history of high blood pressure or who are current or former smokers. ++++++++++++++++++ Recommend Adult Low Dose Aspirin or  coated  Aspirin 81 mg daily  To reduce risk of Colon Cancer 40 %,  Skin Cancer 26 % ,  Melanoma 46%  and  Pancreatic cancer 60% +++++++++++++++++++ Vitamin D goal  is between 70-100.  Please make sure that you are taking your Vitamin D as directed.  It is very important as a natural anti-inflammatory  helping hair, skin, and nails, as well as reducing stroke and heart  attack risk.  It helps your bones and helps with mood. It also decreases numerous  cancer risks so please take it as directed.  Low Vit D is associated with a 200-300% higher risk for CANCER  and 200-300% higher risk for HEART   ATTACK  &  STROKE.   .....................................Marland Kitchen It is also associated with higher death rate at younger ages,  autoimmune diseases like Rheumatoid arthritis, Lupus, Multiple Sclerosis.    Also many other serious conditions, like depression, Alzheimer's Dementia, infertility, muscle aches, fatigue, fibromyalgia - just to name a few. ++++++++++++++++++ Recommend the book "The END of DIETING" by Dr Excell Seltzer  & the book "The END of DIABETES " by Dr Excell Seltzer At [email or web address] - get book & Audio CD's    Being diabetic has a  300% increased risk for heart attack, stroke, cancer, and alzheimer- type vascular dementia. It is very important that you work harder with diet by avoiding all foods that are white. Avoid white rice (brown & wild rice is OK), white potatoes (sweetpotatoes in moderation is OK), White bread or wheat bread or anything made out of white flour like bagels, donuts, rolls, buns, biscuits, cakes, pastries, cookies, pizza crust, and pasta (made from white flour & egg whites) - vegetarian pasta or spinach or wheat pasta is OK. Multigrain breads like Arnold's or [employer], or multigrain sandwich thins or flatbreads.  Diet, exercise and weight loss can reverse and cure diabetes in the early stages.  Diet, exercise and weight loss is very important in the control and prevention of complications of diabetes which affects every system in your body, ie. Brain - dementia/stroke, eyes - glaucoma/blindness, heart - heart attack/heart failure, kidneys - dialysis, stomach - gastric paralysis, intestines - malabsorption, nerves - severe painful neuritis, circulation - gangrene & loss of a leg(s), and finally cancer and Alzheimers.    I recommend avoid fried & greasy foods,  sweets/candy, white rice (brown or wild rice or Quinoa is OK), white  potatoes (sweet potatoes are OK) - anything made from white flour - bagels, doughnuts, rolls, buns, biscuits,white and wheat breads, pizza crust and traditional pasta made of white flour & egg white(vegetarian pasta or spinach or wheat pasta is OK).  Multi-grain bread is OK - like multi-grain flat bread or sandwich thins. Avoid alcohol in excess. Exercise is also important.    Eat all the vegetables you want - avoid meat, especially red meat and dairy - especially cheese.  Cheese is the most concentrated form of trans-fats which is the worst thing to clog up our arteries. Veggie cheese is OK which can be found in the fresh produce section at [employer] or [employer] or [employer]  ++++++++++++++++++++++ DASH Eating Plan  DASH stands for "Dietary Approaches to Stop Hypertension."   The DASH eating plan is a healthy eating plan that has been shown to reduce high blood pressure (hypertension). Additional health benefits may include reducing the risk of type 2 diabetes mellitus, heart disease, and stroke. The DASH eating plan may also help with weight loss. WHAT DO I NEED TO KNOW ABOUT THE DASH EATING PLAN? For the DASH eating plan, you will follow these general guidelines: Choose foods with a percent daily value for sodium of less than 5% (as listed on the food label). Use salt-free seasonings or herbs instead of table salt or sea salt. Check with your health care provider or pharmacist before using salt substitutes. Eat lower-sodium products, often labeled as "lower sodium" or "no  salt added." Eat fresh foods. Eat more vegetables, fruits, and low-fat dairy products. Choose whole grains. Look for the word "whole" as the first word in the ingredient list. Choose fish  Limit sweets, desserts, sugars, and sugary drinks. Choose heart-healthy fats. Eat veggie cheese  Eat more home-cooked food and less restaurant, buffet, and fast food. Limit fried foods. Cook foods using methods other than  frying. Limit canned vegetables. If you do use them, rinse them well to decrease the sodium. When eating at a restaurant, ask that your food be prepared with less salt, or no salt if possible.                      WHAT FOODS CAN I EAT? Read Dr Fara Olden Fuhrman's books on The End of Dieting & The End of Diabetes  Grains Whole grain or whole wheat bread. Brown rice. Whole grain or whole wheat pasta. Quinoa, bulgur, and whole grain cereals. Low-sodium cereals. Corn or whole wheat flour tortillas. Whole grain cornbread. Whole grain crackers. Low-sodium crackers.  Vegetables Fresh or frozen vegetables (raw, steamed, roasted, or grilled). Low-sodium or reduced-sodium tomato and vegetable juices. Low-sodium or reduced-sodium tomato sauce and paste. Low-sodium or reduced-sodium canned vegetables.   Fruits All fresh, canned (in natural juice), or frozen fruits.  Protein Products  All fish and seafood.  Dried beans, peas, or lentils. Unsalted nuts and seeds. Unsalted canned beans.  Dairy Low-fat dairy products, such as skim or 1% milk, 2% or reduced-fat cheeses, low-fat ricotta or cottage cheese, or plain low-fat yogurt. Low-sodium or reduced-sodium cheeses.  Fats and Oils Tub margarines without trans fats. Light or reduced-fat mayonnaise and salad dressings (reduced sodium). Avocado. Safflower, olive, or canola oils. Natural peanut or almond butter.  Other Unsalted popcorn and pretzels. The items listed above may not be a complete list of recommended foods or beverages. Contact your dietitian for more options.  ++++++++++++++++++  WHAT FOODS ARE NOT RECOMMENDED? Grains/ White flour or wheat flour White bread. White pasta. White rice. Refined cornbread. Bagels and croissants. Crackers that contain trans fat.  Vegetables  Creamed or fried vegetables. Vegetables in a . Regular canned vegetables. Regular canned tomato sauce and paste. Regular tomato and vegetable juices.  Fruits Dried fruits.  Canned fruit in light or heavy syrup. Fruit juice.  Meat and Other Protein Products Meat in general - RED meat & White meat.  Fatty cuts of meat. Ribs, chicken wings, all processed meats as bacon, sausage, bologna, salami, fatback, hot dogs, bratwurst and packaged luncheon meats.  Dairy Whole or 2% milk, cream, half-and-half, and cream cheese. Whole-fat or sweetened yogurt. Full-fat cheeses or blue cheese. Non-dairy creamers and whipped toppings. Processed cheese, cheese spreads, or cheese curds.  Condiments Onion and garlic salt, seasoned salt, table salt, and sea salt. Canned and packaged gravies. Worcestershire sauce. Tartar sauce. Barbecue sauce. Teriyaki sauce. Soy sauce, including reduced sodium. Steak sauce. Fish sauce. Oyster sauce. Cocktail sauce. Horseradish. Ketchup and mustard. Meat flavorings and tenderizers. Bouillon cubes. Hot sauce. Tabasco sauce. Marinades. Taco seasonings. Relishes.  Fats and Oils Butter, stick margarine, lard, shortening and bacon fat. Coconut, palm kernel, or palm oils. Regular salad dressings.  Pickles and olives. Salted popcorn and pretzels.  The items listed above may not be a complete list of foods and beverages to avoid.

## 2021-12-05 ENCOUNTER — Ambulatory Visit (INDEPENDENT_AMBULATORY_CARE_PROVIDER_SITE_OTHER): Payer: BC Managed Care – PPO | Admitting: Internal Medicine

## 2021-12-05 ENCOUNTER — Encounter: Payer: Self-pay | Admitting: Internal Medicine

## 2021-12-05 VITALS — BP 130/80 | HR 63 | Temp 98.3°F | Resp 16 | Ht 61.0 in | Wt 151.2 lb

## 2021-12-05 DIAGNOSIS — R5383 Other fatigue: Secondary | ICD-10-CM | POA: Diagnosis not present

## 2021-12-05 DIAGNOSIS — Z1389 Encounter for screening for other disorder: Secondary | ICD-10-CM

## 2021-12-05 DIAGNOSIS — I700 Atherosclerosis of aorta: Secondary | ICD-10-CM | POA: Diagnosis not present

## 2021-12-05 DIAGNOSIS — E559 Vitamin D deficiency, unspecified: Secondary | ICD-10-CM | POA: Diagnosis not present

## 2021-12-05 DIAGNOSIS — Z0001 Encounter for general adult medical examination with abnormal findings: Secondary | ICD-10-CM

## 2021-12-05 DIAGNOSIS — Z111 Encounter for screening for respiratory tuberculosis: Secondary | ICD-10-CM | POA: Diagnosis not present

## 2021-12-05 DIAGNOSIS — Z136 Encounter for screening for cardiovascular disorders: Secondary | ICD-10-CM | POA: Diagnosis not present

## 2021-12-05 DIAGNOSIS — Z13220 Encounter for screening for lipoid disorders: Secondary | ICD-10-CM

## 2021-12-05 DIAGNOSIS — E1169 Type 2 diabetes mellitus with other specified complication: Secondary | ICD-10-CM

## 2021-12-05 DIAGNOSIS — Z131 Encounter for screening for diabetes mellitus: Secondary | ICD-10-CM | POA: Diagnosis not present

## 2021-12-05 DIAGNOSIS — Z130 Encounter for screening for diseases of the blood and blood-forming organs and certain disorders involving the immune mechanism: Secondary | ICD-10-CM | POA: Diagnosis not present

## 2021-12-05 DIAGNOSIS — Z0000 Encounter for general adult medical examination without abnormal findings: Secondary | ICD-10-CM

## 2021-12-05 DIAGNOSIS — Z79899 Other long term (current) drug therapy: Secondary | ICD-10-CM

## 2021-12-05 DIAGNOSIS — E039 Hypothyroidism, unspecified: Secondary | ICD-10-CM

## 2021-12-05 DIAGNOSIS — I10 Essential (primary) hypertension: Secondary | ICD-10-CM | POA: Diagnosis not present

## 2021-12-05 DIAGNOSIS — Z8249 Family history of ischemic heart disease and other diseases of the circulatory system: Secondary | ICD-10-CM | POA: Diagnosis not present

## 2021-12-05 DIAGNOSIS — Z1211 Encounter for screening for malignant neoplasm of colon: Secondary | ICD-10-CM

## 2021-12-05 DIAGNOSIS — E1122 Type 2 diabetes mellitus with diabetic chronic kidney disease: Secondary | ICD-10-CM

## 2021-12-06 LAB — CBC WITH DIFFERENTIAL/PLATELET
Absolute Monocytes: 496 cells/uL (ref 200–950)
Basophils Absolute: 50 cells/uL (ref 0–200)
Basophils Relative: 0.6 %
Eosinophils Absolute: 193 cells/uL (ref 15–500)
Eosinophils Relative: 2.3 %
HCT: 39 % (ref 35.0–45.0)
Hemoglobin: 13.2 g/dL (ref 11.7–15.5)
Lymphs Abs: 2125 cells/uL (ref 850–3900)
MCH: 28.6 pg (ref 27.0–33.0)
MCHC: 33.8 g/dL (ref 32.0–36.0)
MCV: 84.6 fL (ref 80.0–100.0)
MPV: 10.2 fL (ref 7.5–12.5)
Monocytes Relative: 5.9 %
Neutro Abs: 5536 cells/uL (ref 1500–7800)
Neutrophils Relative %: 65.9 %
Platelets: 291 10*3/uL (ref 140–400)
RBC: 4.61 10*6/uL (ref 3.80–5.10)
RDW: 12.8 % (ref 11.0–15.0)
Total Lymphocyte: 25.3 %
WBC: 8.4 10*3/uL (ref 3.8–10.8)

## 2021-12-06 LAB — URINALYSIS, ROUTINE W REFLEX MICROSCOPIC
Bilirubin Urine: NEGATIVE
Glucose, UA: NEGATIVE
Hgb urine dipstick: NEGATIVE
Ketones, ur: NEGATIVE
Leukocytes,Ua: NEGATIVE
Nitrite: NEGATIVE
Protein, ur: NEGATIVE
Specific Gravity, Urine: 1.006 (ref 1.001–1.035)
pH: 6.5 (ref 5.0–8.0)

## 2021-12-06 LAB — IRON, TOTAL/TOTAL IRON BINDING CAP
%SAT: 14 % (calc) — ABNORMAL LOW (ref 16–45)
Iron: 57 ug/dL (ref 45–160)
TIBC: 414 mcg/dL (calc) (ref 250–450)

## 2021-12-06 LAB — COMPLETE METABOLIC PANEL WITH GFR
AG Ratio: 1.8 (calc) (ref 1.0–2.5)
ALT: 21 U/L (ref 6–29)
AST: 19 U/L (ref 10–35)
Albumin: 4.9 g/dL (ref 3.6–5.1)
Alkaline phosphatase (APISO): 42 U/L (ref 37–153)
BUN: 14 mg/dL (ref 7–25)
CO2: 26 mmol/L (ref 20–32)
Calcium: 10 mg/dL (ref 8.6–10.4)
Chloride: 101 mmol/L (ref 98–110)
Creat: 0.89 mg/dL (ref 0.50–1.03)
Globulin: 2.8 g/dL (calc) (ref 1.9–3.7)
Glucose, Bld: 129 mg/dL — ABNORMAL HIGH (ref 65–99)
Potassium: 4 mmol/L (ref 3.5–5.3)
Sodium: 139 mmol/L (ref 135–146)
Total Bilirubin: 0.4 mg/dL (ref 0.2–1.2)
Total Protein: 7.7 g/dL (ref 6.1–8.1)
eGFR: 77 mL/min/{1.73_m2} (ref >=60)

## 2021-12-06 LAB — HEMOGLOBIN A1C
Hgb A1c MFr Bld: 6.9 % of total Hgb — ABNORMAL HIGH (ref <5.7)
Mean Plasma Glucose: 151 mg/dL
eAG (mmol/L): 8.4 mmol/L

## 2021-12-06 LAB — INSULIN, RANDOM: Insulin: 15 u[IU]/mL

## 2021-12-06 LAB — MICROALBUMIN / CREATININE URINE RATIO
Creatinine, Urine: 24 mg/dL (ref 20–275)
Microalb Creat Ratio: 8 mcg/mg creat (ref <30)
Microalb, Ur: 0.2 mg/dL

## 2021-12-06 LAB — VITAMIN B12: Vitamin B-12: 376 pg/mL (ref 200–1100)

## 2021-12-06 LAB — LIPID PANEL
Cholesterol: 186 mg/dL (ref <200)
HDL: 38 mg/dL — ABNORMAL LOW (ref >=50)
LDL Cholesterol (Calc): 108 mg/dL (calc) — ABNORMAL HIGH
Non-HDL Cholesterol (Calc): 148 mg/dL (calc) — ABNORMAL HIGH (ref <130)
Total CHOL/HDL Ratio: 4.9 (calc) (ref <5.0)
Triglycerides: 279 mg/dL — ABNORMAL HIGH (ref <150)

## 2021-12-06 LAB — VITAMIN D 25 HYDROXY (VIT D DEFICIENCY, FRACTURES): Vit D, 25-Hydroxy: 47 ng/mL (ref 30–100)

## 2021-12-06 LAB — TSH: TSH: 1.21 mIU/L

## 2021-12-06 LAB — MAGNESIUM: Magnesium: 2.1 mg/dL (ref 1.5–2.5)

## 2021-12-06 NOTE — Progress Notes (Signed)
<><><><><><><><><><><><><><><><><><><><><><><><><><><><><><><><><> <><><><><><><><><><><><><><><><><><><><><><><><><><><><><><><><><> - Test results slightly outside the reference range are not unusual. If there is anything important, I will review this with you,  otherwise it is considered normal test values.  If you have further questions,  please do not hesitate to contact me at the office or via My Chart.  <><><><><><><><><><><><><><><><><><><><><><><><><><><><><><><><><> <><><><><><><><><><><><><><><><><><><><><><><><><><><><><><><><><>  -  iron is very low - recc take an OTC iron supplement  &   - Eat more Veggies with Iron as Carrots, Beets , all leafy green veggies as   Spinach, Collards,  Turnip - Mustard or Mixed Greens,   Kale, Asparagus, Broccoli, Brussel Sprouts, Green Beans / peas,   Soybeans, Lentils, Sweet Potatoes <><><><><><><><><><><><><><><><><><><><><><><><><><><><><><><><><> <><><><><><><><><><><><><><><><><><><><><><><><><><><><><><><><><>  - Also   -  Vitamin B12 =   376   is    Very Low            (Ideal or Goal Vit B12 is between 450 - 1,100)    -  Low Vit B12 may be associated with Anemia , Fatigue,   Peripheral Neuropathy, Dementia, "Brain Fog", & Depression  - Recommend take a sub-lingual form of Vitamin B12 tablet   1,000 to 5,000 mcg tab that you dissolve under your tongue /Daily   - Can get Lavonia Dana - best price at [employer] or on [employer] <><><><><><><><><><><><><><><><><><><><><><><><><><><><><><><><><> <><><><><><><><><><><><><><><><><><><><><><><><><><><><><><><><><>  -  Total  Chol =    186    -     Slightly Elevated             (  Ideal  or  Goal is less than 180  !  )  & -  Bad / Dangerous LDL  Chol = 108   - is very   Elevated              (  Ideal  or  Goal is less than 70  !  )   - Please be sure not missing doses of Rosuvastatin   &   - Recommend  a      STRICTER        low cholesterol diet    - Cholesterol only comes from  animal sources                                                      - ie. meat, dairy, egg yolks  - Eat all the vegetables you want.   - Avoid Meat, Avoid Meat,  Avoid Meat - especially Red Meat - Beef AND Pork .   - Avoid cheese & dairy - milk & ice cream.     - Cheese is the most concentrated form of trans-fats which  is the worst thing to clog up our arteries.   - Veggie cheese is OK which can be found in the fresh  produce section at [employer] or [employer] or [employer] <><><><><><><><><><><><><><><><><><><><><><><><><><><><><><><><><> <><><><><><><><><><><><><><><><><><><><><><><><><><><><><><><><><>  -  Also,  Triglycerides (   279    ) or fats in blood are too high                 (   Ideal or  Goal is less than 150  !  )    - Recommend avoid fried & greasy foods,  sweets / candy,   - Avoid white rice  (brown or wild rice or Quinoa is OK),   -  Avoid white potatoes  (sweet potatoes are OK)   - Avoid anything made from white flour  - bagels, doughnuts, rolls, buns, biscuits, white and   wheat breads, pizza crust and traditional  pasta made of white flour & egg white  - (vegetarian pasta or spinach or wheat pasta is OK).    - Multi-grain bread is OK - like multi-grain flat bread or  sandwich thins.   - Avoid alcohol in excess.   - Exercise is also important. <><><><><><><><><><><><><><><><><><><><><><><><><><><><><><><><><> <><><><><><><><><><><><><><><><><><><><><><><><><><><><><><><><><>  -  A1c = 6.9%  - Still too high  - Please be sure not missing doses of your Metformin <><><><><><><><><><><><><><><><><><><><><><><><><><><><><><><><><> <><><><><><><><><><><><><><><><><><><><><><><><><><><><><><><><><>  - All Else - CBC - Kidneys - Electrolytes - Liver - Magnesium & Thyroid    - all  Normal /  OK  <><><><><><><><><><><><><><><><><><><><><><><><><><><><><><><><><> <><><><><><><><><><><><><><><><><><><><><><><><><><><><><><><><><>

## 2021-12-08 LAB — QUANTIFERON-TB GOLD PLUS
Mitogen-NIL: >10 IU/mL
NIL: 0.04 IU/mL
QuantiFERON-TB Gold Plus: NEGATIVE
TB1-NIL: 0.02 IU/mL
TB2-NIL: 0.01 IU/mL

## 2021-12-20 ENCOUNTER — Encounter: Payer: Self-pay | Admitting: Internal Medicine

## 2021-12-20 ENCOUNTER — Encounter: Payer: BC Managed Care – PPO | Admitting: Internal Medicine

## 2022-01-03 ENCOUNTER — Other Ambulatory Visit: Payer: Self-pay

## 2022-01-03 DIAGNOSIS — I10 Essential (primary) hypertension: Secondary | ICD-10-CM

## 2022-01-03 MED ORDER — ATENOLOL 100 MG PO TABS: ORAL_TABLET | ORAL | 1 refills | Status: DC

## 2022-01-24 ENCOUNTER — Other Ambulatory Visit: Payer: Self-pay | Admitting: Nurse Practitioner

## 2022-01-24 DIAGNOSIS — Z1231 Encounter for screening mammogram for malignant neoplasm of breast: Secondary | ICD-10-CM

## 2022-02-21 ENCOUNTER — Ambulatory Visit: Payer: BC Managed Care – PPO

## 2022-02-21 DIAGNOSIS — E119 Type 2 diabetes mellitus without complications: Secondary | ICD-10-CM | POA: Diagnosis not present

## 2022-02-21 LAB — HM DIABETES EYE EXAM

## 2022-02-22 ENCOUNTER — Telehealth: Payer: Self-pay

## 2022-02-22 NOTE — Telephone Encounter (Signed)
Patient called to report when she went to the bathroom in at 3 am there was blood on the tissue. She wiped again and a little more. Nothing since.  Recommended OV.  Patient was very agreeable to that as she is concerned. I sent a message to appt desk to call her to schedule.

## 2022-02-23 ENCOUNTER — Encounter: Payer: Self-pay | Admitting: Nurse Practitioner

## 2022-02-23 ENCOUNTER — Other Ambulatory Visit: Payer: Self-pay | Admitting: Nurse Practitioner

## 2022-02-23 ENCOUNTER — Ambulatory Visit: Payer: BC Managed Care – PPO | Admitting: Nurse Practitioner

## 2022-02-23 VITALS — BP 140/88 | HR 90 | Resp 12 | Ht 62.0 in | Wt 148.0 lb

## 2022-02-23 DIAGNOSIS — N920 Excessive and frequent menstruation with regular cycle: Secondary | ICD-10-CM | POA: Diagnosis not present

## 2022-02-23 DIAGNOSIS — N182 Chronic kidney disease, stage 2 (mild): Secondary | ICD-10-CM | POA: Diagnosis not present

## 2022-02-23 DIAGNOSIS — N952 Postmenopausal atrophic vaginitis: Secondary | ICD-10-CM

## 2022-02-23 DIAGNOSIS — N950 Postmenopausal bleeding: Secondary | ICD-10-CM | POA: Diagnosis not present

## 2022-02-23 DIAGNOSIS — R8290 Unspecified abnormal findings in urine: Secondary | ICD-10-CM | POA: Diagnosis not present

## 2022-02-23 LAB — WET PREP FOR TRICH, YEAST, CLUE

## 2022-02-23 NOTE — Progress Notes (Signed)
   Acute Office Visit  Subjective:    Patient ID: Allison Contreras, female    DOB: 1965-10-05, 56 y.o.   MRN: [id number]   HPI 56 y.o. presents today for spotting. 2 nights ago she noticed some spotting with wiping once. None since. Denies urinary or vaginal symptoms. Sexually active 2 days before, no pain with intercourse but does have dryness, no lubrication used. Postmenopausal since 2018 - no HRT.    Review of Systems  Constitutional: Negative.   Genitourinary:  Positive for vaginal bleeding (spotting with wiping). Negative for difficulty urinating, dysuria, flank pain, frequency, genital sores, hematuria, urgency, vaginal discharge and vaginal pain.       Objective:    Physical Exam Constitutional:      Appearance: Normal appearance.  Genitourinary:    General: Normal vulva.     Vagina: Normal.     Cervix: Normal.     Uterus: Normal.      Comments: Atrophic changes present    BP (!) 140/88   Pulse 90   Resp 12   Ht 5\' 2"  (1.575 m)   Wt 148 lb (67.1 kg)   LMP 09/06/2016   BMI 27.07 kg/m  Wt Readings from Last 3 Encounters:  02/23/22 148 lb (67.1 kg)  12/05/21 151 lb 3.2 oz (68.6 kg)  09/06/21 151 lb (68.5 kg)        Patient informed chaperone available to be present for breast and/or pelvic exam. Patient has requested no chaperone to be present. Patient has been advised what will be completed during breast and pelvic exam.   UA negative Wet prep negative  Assessment & Plan:   Problem List Items Addressed This Visit   None Visit Diagnoses     Spotting    -  Primary   Relevant Orders   Urinalysis,Complete w/RFL Culture (Completed)   WET PREP FOR [place], YEAST, CLUE (Completed)   Atrophic vaginitis          Plan: UA unremarkable, culture pending. Atrophic changes present. Recommend using oil-based lubrication such as coconut oil during intercourse. Normal exam, negative wet prep. Will monitor and wait on culture. If normal and spotting occurs again even  with lubricant use she will return.      [place], 12:22 PM 02/23/2022

## 2022-02-24 DIAGNOSIS — B353 Tinea pedis: Secondary | ICD-10-CM | POA: Diagnosis not present

## 2022-02-24 DIAGNOSIS — L570 Actinic keratosis: Secondary | ICD-10-CM | POA: Diagnosis not present

## 2022-02-24 DIAGNOSIS — L578 Other skin changes due to chronic exposure to nonionizing radiation: Secondary | ICD-10-CM | POA: Diagnosis not present

## 2022-02-24 DIAGNOSIS — D225 Melanocytic nevi of trunk: Secondary | ICD-10-CM | POA: Diagnosis not present

## 2022-02-24 DIAGNOSIS — D2372 Other benign neoplasm of skin of left lower limb, including hip: Secondary | ICD-10-CM | POA: Diagnosis not present

## 2022-03-09 LAB — URINALYSIS, COMPLETE W/RFL CULTURE
Bilirubin Urine: NEGATIVE
Glucose, UA: NEGATIVE
Hgb urine dipstick: NEGATIVE
Hyaline Cast: NONE SEEN /LPF
Ketones, ur: NEGATIVE
Nitrites, Initial: NEGATIVE
RBC / HPF: NONE SEEN /HPF (ref 0–2)
Specific Gravity, Urine: 1.01 (ref 1.001–1.035)
pH: 6 (ref 5.0–8.0)

## 2022-03-09 LAB — URINE CULTURE
MICRO NUMBER:: 14042495
Result:: NO GROWTH
SPECIMEN QUALITY:: ADEQUATE

## 2022-03-09 LAB — CULTURE INDICATED

## 2022-03-13 ENCOUNTER — Ambulatory Visit
Admission: RE | Admit: 2022-03-13 | Discharge: 2022-03-13 | Disposition: A | Discharge status: Home / Self Care | Payer: BC Managed Care – PPO | Source: Ambulatory Visit | Attending: Nurse Practitioner | Admitting: Nurse Practitioner

## 2022-03-13 DIAGNOSIS — Z1231 Encounter for screening mammogram for malignant neoplasm of breast: Secondary | ICD-10-CM

## 2022-03-14 ENCOUNTER — Ambulatory Visit: Payer: BC Managed Care – PPO | Admitting: Nurse Practitioner

## 2022-03-14 ENCOUNTER — Encounter: Payer: Self-pay | Admitting: Nurse Practitioner

## 2022-03-14 VITALS — BP 132/80 | HR 56 | Temp 97.9°F | Ht 62.0 in | Wt 146.8 lb

## 2022-03-14 DIAGNOSIS — N182 Chronic kidney disease, stage 2 (mild): Secondary | ICD-10-CM

## 2022-03-14 DIAGNOSIS — E785 Hyperlipidemia, unspecified: Secondary | ICD-10-CM

## 2022-03-14 DIAGNOSIS — I10 Essential (primary) hypertension: Secondary | ICD-10-CM | POA: Diagnosis not present

## 2022-03-14 DIAGNOSIS — E1169 Type 2 diabetes mellitus with other specified complication: Secondary | ICD-10-CM

## 2022-03-14 DIAGNOSIS — E039 Hypothyroidism, unspecified: Secondary | ICD-10-CM

## 2022-03-14 DIAGNOSIS — E1122 Type 2 diabetes mellitus with diabetic chronic kidney disease: Secondary | ICD-10-CM | POA: Diagnosis not present

## 2022-03-14 DIAGNOSIS — Z6828 Body mass index (BMI) 28.0-28.9, adult: Secondary | ICD-10-CM

## 2022-03-14 DIAGNOSIS — F419 Anxiety disorder, unspecified: Secondary | ICD-10-CM

## 2022-03-14 DIAGNOSIS — Z79899 Other long term (current) drug therapy: Secondary | ICD-10-CM

## 2022-03-14 MED ORDER — ALPRAZOLAM 0.5 MG PO TABS: ORAL_TABLET | ORAL | 0 refills | Status: DC

## 2022-03-14 NOTE — Progress Notes (Signed)
FOLLOW UP 3 MONTH  Assessment and Plan:    Hypertension Discussed DASH (Dietary Approaches to Stop Hypertension) DASH diet is lower in sodium than a typical American diet. Cut back on foods that are high in saturated fat, cholesterol, and trans fats. Eat more whole-grain foods, fish, poultry, and nuts Remain active and exercise as tolerated daily.  Monitor BP at home-Call if greater than 130/80.  Check CMP/CBC   Cholesterol Discussed lifestyle modifications. Recommended diet heavy in fruits and veggies, omega 3's. Decrease consumption of animal meats, cheeses, and dairy products. Remain active and exercise as tolerated. Continue to monitor. Check lipids/TSH   DM II Education: Reviewed 'ABCs' of diabetes management  Discussed goals to be met and/or maintained include A1C (<7) Blood pressure (<130/80) Cholesterol (LDL <70) Continue Eye Exam yearly  Continue Dental Exam Q6 mo Discussed dietary recommendations Discussed Physical Activity recommendations Foot exam UTD Check A1C   BMI 28.35 Discussed appropriate BMI Diet modification. Physical activity. Encouraged/praised to build confidence.   Hypothyroidism Controlled. Continue Levothyroxine. Reminded to take on an empty stomach 30-67mns before food.  Stop any Biotin Supplement 48-72 hours before next TSH level to reduce the risk of falsely low TSH levels. Continue to monitor.     Anxiety Currently only using Xanax if she flys Well controlled with behavior modification  Medication Management All medications discussed and reviewed in full. All questions and concerns regarding medications addressed.    Orders Placed This Encounter  Procedures   CBC with Differential/Platelet   COMPLETE METABOLIC PANEL WITH GFR   Lipid panel   TSH   Hemoglobin A1c    Continue diet and meds as discussed. Further disposition pending results of labs. Discussed med's effects and SE's.    Over 20 minutes of face to face  interview, exam, counseling, chart review, and critical decision making was performed.   Future Appointments  Date Time Provider [place] 04/05/2022  4:00 PM WTamela Gammon NP [facility] None  06/20/2022  9:30 AM MUnk Pinto MD [facility] None  12/11/2022 11:00 AM MUnk Pinto MD [facility] None    ----------------------------------------------------------------------------------------------------------------------  HPI 56y.o. female  presents for 3 month follow up on HTN, HLD, T2DM, weight, hypothyroidism and vitamin D deficiency.   Overall she reports feeling well.  She will be [place] to [place] December 20[place] to watch her daughter graduate from Archeology school.  She uses Xanax when she flies.  She recently saw GYN for post menopausal bleeding.  Noted for atrophic vaginitis.  Recommended to use oil-based lubricant and/or coconut intercourse.  She is to return if even with lubricant she notices spotting.   She also saw Dermatology for a dermatofibroma of LLE and general follow up  She watches her 469year old granddaughter.  Enjoys keeping her.    BMI is Body mass index is 26.85 kg/m., she has been working on exercise. Wt Readings from Last 3 Encounters:  03/14/22 146 lb 12.8 oz (66.6 kg)  02/23/22 148 lb (67.1 kg)  12/05/21 151 lb 3.2 oz (68.6 kg)   Didn't tolerate olmesartan, unsure of reaction, lisinopril, dry cough Taking losartan 1 tab only, atenolol 1052mQD , HCTZ 25 mg Her blood pressure has not been controlled at home (130-140s/70-80s), today their BP is BP: 132/80  She does workout. She denies chest pain, shortness of breath, dizziness.   She is on cholesterol medication (fenofibrate- currently taking daily, prescribed rosuvastatin but never started, willing to start) and denies myalgias. Her cholesterol is not at  goal. The cholesterol last visit was:   Lab Results  Component Value Date   CHOL 186 12/05/2021   HDL 38 (L) 12/05/2021   LDLCALC  108 (H) 12/05/2021   TRIG 279 (H) 12/05/2021   CHOLHDL 4.9 12/05/2021    She has been working on diet and exercise for T2DM  She is taking metformin 500 mg  Two tablets twice a day with meals  She denies foot ulcerations, increased appetite, nausea, paresthesia of the feet, polydipsia, polyuria, visual disturbances, vomiting and weight loss.  She checks fasting glucose 130-140s. Last A1C in the office was:  Lab Results  Component Value Date   HGBA1C 6.9 (H) 12/05/2021   Stage 2 CKD associated with T2DM, on losartan  Lab Results  Component Value Date   GFRNONAA 65 08/11/2020   She is on thyroid medication. Her medication was not changed last visit.  Taking 112 mcg daily  Lab Results  Component Value Date   TSH 1.21 12/05/2021  . Patient is on Vitamin D supplement, taking 5000 IU maybe every other day  Lab Results  Component Value Date   VD25OH 47 12/05/2021       Current Medications:  Current Outpatient Medications on File Prior to Visit  Medication Sig   acetaminophen (TYLENOL) 500 MG tablet Take 500 mg by mouth every 6 (six) hours as needed.   ALPRAZolam (XANAX) 0.5 MG tablet Take     1/2 to 1 tablet          2 to 3 x /day            as needed for Anxiety   atenolol (TENORMIN) 100 MG tablet Take one tablet daily for blood pressure.   Cholecalciferol 125 MCG (5000 UT) capsule Take 5,000 Units by mouth daily.   Cyanocobalamin (VITAMIN B-12 PO) Take by mouth.   fenofibrate micronized (LOFIBRA) 134 MG capsule TAKE 1 CAPSULE DAILY FOR TRIGLYCERIDES (BLOOD FATS)   glucose blood (ONETOUCH VERIO) test strip Check blood sugar Daily  ( Dx:  e.11 )   hydrochlorothiazide (HYDRODIURIL) 25 MG tablet TAKE 1 TABLET EVERY MORNING FOR BLOOD PRESSURE & FLUID RETENTION / ANKLE SWELLING   Lancets (ONETOUCH DELICA PLUS [id number]) MISC USE TO TEST BLOOD SUGAR ONCE DAILY   levothyroxine (SYNTHROID) 112 MCG tablet 1 TAB DAILY ON EMPTY STOMACH W/ ONLY H2O X 30MINS & NO ANTACID MEDS,  CALCIUM/MAGNESIUM X 4HRS & AVOID BIOTIN   losartan (COZAAR) 100 MG tablet TAKE 1 TABLET DAILY FOR BP & DIABETIC KIDNEY PROTECTION   metFORMIN (GLUCOPHAGE-XR) 500 MG 24 hr tablet TAKE 2 TABLETS 2 X /DAY WITH MEALS FOR DIABETES   rosuvastatin (CRESTOR) 10 MG tablet Take 1 tablet Daily for Cholesterol   No current facility-administered medications on file prior to visit.     Allergies:  Allergies  Allergen Reactions   Codeine    Ppd [Tuberculin Purified Protein Derivative]     Positive reaction 2001   Lisinopril     Dry cough     Medical History:  Past Medical History:  Diagnosis Date   Hypertension    Hypothyroidism    Splenomegaly 02/14/2019   02/14/2019 abd [place]: Spleen measures 14.1 x 15.6 x 4.7 cm with a measured splenic volume of 542 cubic cm. No focal splenic lesions are evident.   Allergies Allergies  Allergen Reactions   Codeine    Ppd [Tuberculin Purified Protein Derivative]     Positive reaction 2001   Lisinopril     Dry cough  SURGICAL HISTORY She  has a past surgical history that includes Lymph node dissection. FAMILY HISTORY Her family history includes Cancer in her mother; Diabetes in her mother; Heart disease in her father; Hyperlipidemia in her mother; Hypertension in her father and mother. SOCIAL HISTORY She  reports that she has never smoked. She has never used smokeless tobacco. She reports current alcohol use. She reports that she does not use drugs.   Review of Systems:  Review of Systems  Constitutional:  Negative for chills, fever, malaise/fatigue and weight loss.  HENT:  Negative for congestion, hearing loss and tinnitus.   Eyes:  Negative for blurred vision and double vision.  Respiratory:  Negative for cough, shortness of breath and wheezing.   Cardiovascular:  Negative for chest pain, palpitations, orthopnea, claudication and leg swelling.  Gastrointestinal:  Negative for abdominal pain, blood in stool, constipation, diarrhea, heartburn,  melena, nausea and vomiting.  Genitourinary: Negative.   Musculoskeletal:  Negative for falls, joint pain and myalgias.  Skin:  Negative for rash.  Neurological:  Negative for dizziness, tingling, tremors, sensory change, loss of consciousness, weakness and headaches.  Endo/Heme/Allergies:  Negative for polydipsia.  Psychiatric/Behavioral: Negative.  Negative for depression, memory loss and suicidal ideas.   All other systems reviewed and are negative.     Physical Exam: BP 132/80   Pulse (!) 56   Temp 97.9 F (36.6 C)   Ht _0  (1.575 m)   Wt 146 lb 12.8 oz (66.6 kg)   LMP 09/06/2016   SpO2 99%   BMI 26.85 kg/m  Wt Readings from Last 3 Encounters:  03/14/22 146 lb 12.8 oz (66.6 kg)  02/23/22 148 lb (67.1 kg)  12/05/21 151 lb 3.2 oz (68.6 kg)   General Appearance: Well nourished, in no apparent distress. Eyes: PERRLA, EOMs, conjunctiva no swelling or erythema Sinuses: No Frontal/maxillary tenderness ENT/Mouth: Ext aud canals clear, TMs without erythema, bulging. No erythema, swelling, or exudate on post pharynx.  Tonsils not swollen or erythematous. Hearing normal.  Neck: Supple, thyroid normal.  Respiratory: Respiratory effort normal, BS equal bilaterally without rales, rhonchi, wheezing or stridor.  Cardio: RRR with no MRGs. Brisk peripheral pulses without edema.  Abdomen: Soft, + BS.  Non tender, no guarding, rebound, hernias, masses. Lymphatics: Non tender without lymphadenopathy.  Musculoskeletal: Full ROM, 5/5 strength, Normal gait.  No redness or warmth of left popliteal, good pulses PT and DP Skin: Warm, dry without rashes, lesions, ecchymosis.  Neuro: Cranial nerves intact. No cerebellar symptoms.  Psych: Awake and oriented X 3, normal affect, Insight and Judgment appropriate.    Darrol Jump, NP 10:51 AM [facility]

## 2022-03-15 LAB — COMPLETE METABOLIC PANEL WITH GFR
AG Ratio: 1.4 (calc) (ref 1.0–2.5)
ALT: 15 U/L (ref 6–29)
AST: 15 U/L (ref 10–35)
Albumin: 4.4 g/dL (ref 3.6–5.1)
Alkaline phosphatase (APISO): 36 U/L — ABNORMAL LOW (ref 37–153)
BUN: 14 mg/dL (ref 7–25)
CO2: 28 mmol/L (ref 20–32)
Calcium: 10 mg/dL (ref 8.6–10.4)
Chloride: 100 mmol/L (ref 98–110)
Creat: 0.84 mg/dL (ref 0.50–1.03)
Globulin: 3.1 g/dL (calc) (ref 1.9–3.7)
Glucose, Bld: 104 mg/dL — ABNORMAL HIGH (ref 65–99)
Potassium: 4.4 mmol/L (ref 3.5–5.3)
Sodium: 139 mmol/L (ref 135–146)
Total Bilirubin: 0.4 mg/dL (ref 0.2–1.2)
Total Protein: 7.5 g/dL (ref 6.1–8.1)
eGFR: 82 mL/min/{1.73_m2} (ref >=60)

## 2022-03-15 LAB — CBC WITH DIFFERENTIAL/PLATELET
Absolute Monocytes: 533 cells/uL (ref 200–950)
Basophils Absolute: 43 cells/uL (ref 0–200)
Basophils Relative: 0.5 %
Eosinophils Absolute: 172 cells/uL (ref 15–500)
Eosinophils Relative: 2 %
HCT: 38.7 % (ref 35.0–45.0)
Hemoglobin: 12.9 g/dL (ref 11.7–15.5)
Lymphs Abs: 2924 cells/uL (ref 850–3900)
MCH: 28.2 pg (ref 27.0–33.0)
MCHC: 33.3 g/dL (ref 32.0–36.0)
MCV: 84.7 fL (ref 80.0–100.0)
MPV: 10 fL (ref 7.5–12.5)
Monocytes Relative: 6.2 %
Neutro Abs: 4928 cells/uL (ref 1500–7800)
Neutrophils Relative %: 57.3 %
Platelets: 313 10*3/uL (ref 140–400)
RBC: 4.57 10*6/uL (ref 3.80–5.10)
RDW: 13.4 % (ref 11.0–15.0)
Total Lymphocyte: 34 %
WBC: 8.6 10*3/uL (ref 3.8–10.8)

## 2022-03-15 LAB — LIPID PANEL
Cholesterol: 154 mg/dL (ref <200)
HDL: 28 mg/dL — ABNORMAL LOW (ref >=50)
LDL Cholesterol (Calc): 86 mg/dL (calc)
Non-HDL Cholesterol (Calc): 126 mg/dL (calc) (ref <130)
Total CHOL/HDL Ratio: 5.5 (calc) — ABNORMAL HIGH (ref <5.0)
Triglycerides: 333 mg/dL — ABNORMAL HIGH (ref <150)

## 2022-03-15 LAB — HEMOGLOBIN A1C
Hgb A1c MFr Bld: 6.7 % of total Hgb — ABNORMAL HIGH (ref <5.7)
Mean Plasma Glucose: 146 mg/dL
eAG (mmol/L): 8.1 mmol/L

## 2022-03-15 LAB — TSH: TSH: 0.56 mIU/L (ref 0.40–4.50)

## 2022-03-20 ENCOUNTER — Other Ambulatory Visit: Payer: Self-pay

## 2022-03-20 DIAGNOSIS — E1165 Type 2 diabetes mellitus with hyperglycemia: Secondary | ICD-10-CM

## 2022-03-20 DIAGNOSIS — E1122 Type 2 diabetes mellitus with diabetic chronic kidney disease: Secondary | ICD-10-CM

## 2022-03-20 DIAGNOSIS — I10 Essential (primary) hypertension: Secondary | ICD-10-CM

## 2022-03-20 MED ORDER — LOSARTAN POTASSIUM 100 MG PO TABS: ORAL_TABLET | ORAL | 3 refills | Status: DC

## 2022-04-03 ENCOUNTER — Encounter: Payer: Self-pay | Admitting: Internal Medicine

## 2022-04-05 ENCOUNTER — Ambulatory Visit (INDEPENDENT_AMBULATORY_CARE_PROVIDER_SITE_OTHER): Payer: BC Managed Care – PPO | Admitting: Nurse Practitioner

## 2022-04-05 ENCOUNTER — Encounter: Payer: Self-pay | Admitting: Nurse Practitioner

## 2022-04-05 VITALS — BP 132/78 | HR 68 | Ht 61.0 in | Wt 148.0 lb

## 2022-04-05 DIAGNOSIS — Z780 Asymptomatic menopausal state: Secondary | ICD-10-CM | POA: Diagnosis not present

## 2022-04-05 DIAGNOSIS — Z01419 Encounter for gynecological examination (general) (routine) without abnormal findings: Secondary | ICD-10-CM | POA: Diagnosis not present

## 2022-04-05 NOTE — Progress Notes (Signed)
   Allison Contreras 05-15-1966 [id number]   History:  56 y.o. G2P2 presents for annual exam. Postmenopausal - no HRT, no bleeding. Normal pap and mammogram history. T2DM, HTN, HLD, and hypothyroidism managed by PCP.   Gynecologic History Contraception: post menopausal status Sexually active: Yes  Health maintenance Last Pap: 03/27/2018. Results were: Normal neg HPV Last mammogram: 03/13/2022. Results were: Normal Last colonoscopy: Never. Negative Cologuard 11/2019 Last Dexa: 04/09/2019. Results were: Normal  Past medical history, past surgical history, family history and social history were all reviewed and documented in the EPIC chart. Married. Retired. 31 yo daughter graduating with archeology degree in [place] next month. 64 yo son - has 38 yo daughter. She watches granddaughter daily.   ROS:  A ROS was performed and pertinent positives and negatives are included.  Exam:  Vitals:   04/05/22 1602  BP: 132/78  Pulse: 68  SpO2: 99%  Weight: 148 lb (67.1 kg)  Height: 5\' 1"  (1.549 m)     Body mass index is 27.96 kg/m.  General appearance:  Normal Thyroid:  Symmetrical, normal in size, without palpable masses or nodularity. Respiratory  Auscultation:  Clear without wheezing or rhonchi Cardiovascular  Auscultation:  Regular rate, without rubs, murmurs or gallops  Edema/varicosities:  Not grossly evident Abdominal  Soft,nontender, without masses, guarding or rebound.  Liver/spleen:  No organomegaly noted  Hernia:  None appreciated  Skin  Inspection:  Grossly normal   Breasts: Examined lying and sitting.   Right: Without masses, retractions, discharge or axillary adenopathy.   Left: Without masses, retractions, discharge or axillary adenopathy. Genitourinary   Inguinal/mons:  Normal without inguinal adenopathy  External genitalia:  Normal appearing vulva with no masses, tenderness, or lesions  BUS/Urethra/Skene's glands:  Normal  Vagina:  Normal appearing with normal  color and discharge, no lesions. Atrophic changes  Cervix:  Normal appearing without discharge or lesions  Uterus:  Normal in size, shape and contour.  Midline and mobile, nontender  Adnexa/parametria:     Rt: Normal in size, without masses or tenderness.   Lt: Normal in size, without masses or tenderness.  Anus and perineum: Normal  Digital rectal exam: Normal sphincter tone without palpated masses or tenderness  Patient informed chaperone available to be present for breast and pelvic exam. Patient has requested no chaperone to be present. Patient has been advised what will be completed during breast and pelvic exam.   Assessment/Plan:  56 y.o. G2P2 for annual exam.   Well female exam with routine gynecological exam - Education provided on SBEs, importance of preventative screenings, current guidelines, high calcium diet, regular exercise, and multivitamin daily. Labs with PCP.   Postmenopausal - no HRT, no bleeding.   Screening for cervical cancer - Normal pap history. Will repeat at 5-year interval per guidelines.   Screening for breast cancer - Normal mammogram history. Continue annual screenings. Normal breast exam today.   Screening for colon cancer - Negative Cologuard 11/2019. Will repeat at 3-year interval per guidelines.   Screening for osteoporosis - Normal bone density 03/2019. Will repeat at 5-year interval per recommendation.   Follow up in 1 year for annual.      04/2019 [facility], 4:14 PM 04/05/2022

## 2022-04-10 ENCOUNTER — Encounter: Payer: Self-pay | Admitting: Internal Medicine

## 2022-04-10 ENCOUNTER — Ambulatory Visit (INDEPENDENT_AMBULATORY_CARE_PROVIDER_SITE_OTHER): Payer: BC Managed Care – PPO | Admitting: Internal Medicine

## 2022-04-10 VITALS — BP 190/90 | HR 62 | Temp 96.9°F | Ht 61.0 in | Wt 146.2 lb

## 2022-04-10 DIAGNOSIS — M25561 Pain in right knee: Secondary | ICD-10-CM

## 2022-04-10 DIAGNOSIS — E039 Hypothyroidism, unspecified: Secondary | ICD-10-CM

## 2022-04-10 DIAGNOSIS — I10 Essential (primary) hypertension: Secondary | ICD-10-CM

## 2022-04-10 MED ORDER — OLMESARTAN MEDOXOMIL 40 MG PO TABS: ORAL_TABLET | ORAL | 3 refills | Status: DC

## 2022-04-10 MED ORDER — LEVOTHYROXINE SODIUM 112 MCG PO TABS: ORAL_TABLET | ORAL | 3 refills | Status: DC

## 2022-04-10 MED ORDER — CLONIDINE HCL 0.1 MG PO TABS: ORAL_TABLET | ORAL | 3 refills | Status: DC

## 2022-04-10 MED ORDER — DEXAMETHASONE 4 MG PO TABS: ORAL_TABLET | ORAL | 0 refills | Status: DC

## 2022-04-10 NOTE — Progress Notes (Signed)
Future Appointments  Date Time Provider Department  06/20/2022  9:30 AM Lucky Cowboy, MD [facility]  12/11/2022 11:00 AM Lucky Cowboy, MD [facility]  04/09/2023  4:00 PM Olivia Mackie, NP [facility]    History of Present Illness:      This very nice 56 y.o. MWF with  HTN, HLD, T2_NIDDM  and Vitamin D Deficiency presents with c/o R postero-lateral Rt knee pains.  She reports walking relieves the discomfort. Denies injury or acute onset.                                                    BP was also noted elevated by the nurse at 190/90 and was higher at 230/100 by my recheck. She admits recent home BPs have been elevated in the 150-160 sys range.      Medications    levothyroxine (SYNTHROID) 112 MCG tablet, 1 TAB DAILY    metFORMIN (GLUCOPHAGE-XR) 500 MG 24 hr tablet, TAKE 2 TABLETS 2 X /DAY WITH MEALS   atenolol (TENORMIN) 100 MG tablet, Take one tablet daily    fenofibrate micronized (LOFIBRA) 134 MG capsule, TAKE 1 CAPSULE DAILY    hydrochlorothiazide (HYDRODIURIL) 25 MG tablet, TAKE 1 TABLET EVERY MORNING    losartan (COZAAR) 100 MG tablet, TAKE 1 TABLET DAILY    rosuvastatin (CRESTOR) 10 MG tablet, Take 1 tablet Daily for Cholesterol   acetaminophen (TYLENOL) 500 MG tablet, Take 500 mg  every 6 (six) hours as needed.   Cyanocobalamin (VITAMIN B-12 PO), Take    ALPRAZolam (XANAX) 0.5 MG tablet, Take  1/2 to 1 tablet  2 to 3 x /day   as needed for Anxiety   Cholecalciferol 5000 u, Take daily.  Problem list She has Essential hypertension; Hypothyroidism; Hyperlipidemia associated with type 2 diabetes mellitus (HCC); Irregular periods/menstrual cycles; Vitamin D deficiency; Medication management; BMI 28.0-28.9,adult; FHx: heart disease; Type 2 diabetes mellitus with stage 2 chronic kidney disease, without long-term current use of insulin (HCC); and Hepatic steatosis on their problem list.   Observations/Objective:  BP (!) 190/90   Pulse 62   Temp (!) 96.9 F (36.1  C)   Ht 5\' 1"  (1.549 m)   Wt 146 lb 3.2 oz (66.3 kg)   LMP 09/06/2016   SpO2 99%   BMI 27.62 kg/m   Recheck BP 230/100 Rt arm cuff.   HEENT - WNL. Neck - supple.  Chest - Clear equal BS. Cor - Nl HS. RRR w/o sig MGR. PP 1(+). No edema. MS- FROM w/o deformities.  Gait Nl. Rt Knee - Nl ROM . No Crepitus. No obvious effusion or synovitis. Sl tender around the postero-lateral aspect pf the knee. 09/08/2016 No palpable popliteal cyst.  Neuro -  Nl w/o focal abnormalities.   Assessment and Plan:   1. Accelerated hypertension  - olmesartan  40 MG tablet; Take  1 tablet  every Night  for BP & Diabetic Kidney Protection  Dispense: 90 tablet; Refill: 3       (Replaces Losartan )  - cloNIDine  0.1 MG tablet; Take 1 tablet 3 x /day with Meals for BP for Sys BP > 170  Dispense: 270 tablet; Refill: 3  - Advised monitor BP 2 x /day & return 4 days to recheck.    2. Posterior right knee pain  - dexamethasone 4 MG  tablet; Take 1 tab 3 x day - 3 days, then 2 x day - 3 days, then 1 tab daily  Dispense: 20 tablet  3. Hypothyroidism  -  Refilled  - levothyroxine 112 MCG tablet   Follow Up Instructions:        I discussed the assessment and treatment plan with the patient. The patient was provided an opportunity to ask questions and all were answered. The patient agreed with the plan and demonstrated an understanding of the instructions.       The patient was advised to call back or seek an in-person evaluation if the symptoms worsen or if the condition fails to improve as anticipated.    Marinus Maw, MD

## 2022-04-13 ENCOUNTER — Encounter: Payer: Self-pay | Admitting: Internal Medicine

## 2022-04-13 ENCOUNTER — Ambulatory Visit (INDEPENDENT_AMBULATORY_CARE_PROVIDER_SITE_OTHER): Payer: BC Managed Care – PPO | Admitting: Internal Medicine

## 2022-04-13 VITALS — BP 218/90 | HR 71 | Temp 97.3°F | Ht 61.0 in | Wt 146.0 lb

## 2022-04-13 DIAGNOSIS — I10 Essential (primary) hypertension: Secondary | ICD-10-CM

## 2022-04-13 DIAGNOSIS — M25561 Pain in right knee: Secondary | ICD-10-CM | POA: Diagnosis not present

## 2022-04-13 DIAGNOSIS — E782 Mixed hyperlipidemia: Secondary | ICD-10-CM | POA: Diagnosis not present

## 2022-04-13 DIAGNOSIS — E1165 Type 2 diabetes mellitus with hyperglycemia: Secondary | ICD-10-CM

## 2022-04-13 MED ORDER — ONETOUCH VERIO VI STRP: ORAL_STRIP | 3 refills | Status: DC

## 2022-04-13 MED ORDER — FENOFIBRATE MICRONIZED 134 MG PO CAPS: ORAL_CAPSULE | ORAL | 3 refills | Status: DC

## 2022-04-13 MED ORDER — ONETOUCH DELICA PLUS LANCET30G MISC: 11 refills | Status: DC

## 2022-04-13 NOTE — Patient Instructions (Signed)
Hypertension, Adult High blood pressure (hypertension) is when the force of blood pumping through the arteries is too strong. The arteries are the blood vessels that carry blood from the heart throughout the body. Hypertension forces the heart to work harder to pump blood and may cause arteries to become narrow or stiff. Untreated or uncontrolled hypertension can lead to a heart attack, heart failure, a stroke, kidney disease, and other problems. A blood pressure reading consists of a higher number over a lower number. Ideally, your blood pressure should be below 120/80. The first ("top") number is called the systolic pressure. It is a measure of the pressure in your arteries as your heart beats. The second ("bottom") number is called the diastolic pressure. It is a measure of the pressure in your arteries as the heart relaxes. What are the causes? The exact cause of this condition is not known. There are some conditions that result in high blood pressure. What increases the risk? Certain factors may make you more likely to develop high blood pressure. Some of these risk factors are under your control, including: Smoking. Not getting enough exercise or physical activity. Being overweight. Having too much fat, sugar, calories, or salt (sodium) in your diet. Drinking too much alcohol. Other risk factors include: Having a personal history of heart disease, diabetes, high cholesterol, or kidney disease. Stress. Having a family history of high blood pressure and high cholesterol. Having obstructive sleep apnea. Age. The risk increases with age. What are the signs or symptoms? High blood pressure may not cause symptoms. Very high blood pressure (hypertensive crisis) may cause: Headache. Fast or irregular heartbeats (palpitations). Shortness of breath. Nosebleed. Nausea and vomiting. Vision changes. Severe chest pain, dizziness, and seizures. How is this diagnosed? This condition is diagnosed by  measuring your blood pressure while you are seated, with your arm resting on a flat surface, your legs uncrossed, and your feet flat on the floor. The cuff of the blood pressure monitor will be placed directly against the skin of your upper arm at the level of your heart. Blood pressure should be measured at least twice using the same arm. Certain conditions can cause a difference in blood pressure between your right and left arms. If you have a high blood pressure reading during one visit or you have normal blood pressure with other risk factors, you may be asked to: Return on a different day to have your blood pressure checked again. Monitor your blood pressure at home for 1 week or longer. If you are diagnosed with hypertension, you may have other blood or imaging tests to help your health care provider understand your overall risk for other conditions. How is this treated? This condition is treated by making healthy lifestyle changes, such as eating healthy foods, exercising more, and reducing your alcohol intake. You may be referred for counseling on a healthy diet and physical activity. Your health care provider may prescribe medicine if lifestyle changes are not enough to get your blood pressure under control and if: Your systolic blood pressure is above 130. Your diastolic blood pressure is above 80. Your personal target blood pressure may vary depending on your medical conditions, your age, and other factors. Follow these instructions at home: Eating and drinking  Eat a diet that is high in fiber and potassium, and low in sodium, added sugar, and fat. An example of this eating plan is called the DASH diet. DASH stands for Dietary Approaches to Stop Hypertension. To eat this way: Eat   plenty of fresh fruits and vegetables. Try to fill one half of your plate at each meal with fruits and vegetables. Eat whole grains, such as whole-wheat pasta, brown rice, or whole-grain bread. Fill about one  fourth of your plate with whole grains. Eat or drink low-fat dairy products, such as skim milk or low-fat yogurt. Avoid fatty cuts of meat, processed or cured meats, and poultry with skin. Fill about one fourth of your plate with lean proteins, such as fish, chicken without skin, beans, eggs, or tofu. Avoid pre-made and processed foods. These tend to be higher in sodium, added sugar, and fat. Reduce your daily sodium intake. Many people with hypertension should eat less than 1,500 mg of sodium a day. Do not drink alcohol if: Your health care provider tells you not to drink. You are pregnant, may be pregnant, or are planning to become pregnant. If you drink alcohol: Limit how much you have to: 0-1 drink a day for women. 0-2 drinks a day for men. Know how much alcohol is in your drink. In the [place] one drink equals one 12 oz bottle of beer (355 mL), one 5 oz glass of wine (148 mL), or one 1 oz glass of hard liquor (44 mL). Lifestyle  Work with your health care provider to maintain a healthy body weight or to lose weight. Ask what an ideal weight is for you. Get at least 30 minutes of exercise that causes your heart to beat faster (aerobic exercise) most days of the week. Activities may include walking, swimming, or biking. Include exercise to strengthen your muscles (resistance exercise), such as Pilates or lifting weights, as part of your weekly exercise routine. Try to do these types of exercises for 30 minutes at least 3 days a week. Do not use any products that contain nicotine or tobacco. These products include cigarettes, chewing tobacco, and vaping devices, such as e-cigarettes. If you need help quitting, ask your health care provider. Monitor your blood pressure at home as told by your health care provider. Keep all follow-up visits. This is important. Medicines Take over-the-counter and prescription medicines only as told by your health care provider. Follow directions carefully. Blood  pressure medicines must be taken as prescribed. Do not skip doses of blood pressure medicine. Doing this puts you at risk for problems and can make the medicine less effective. Ask your health care provider about side effects or reactions to medicines that you should watch for. Contact a health care provider if you: Think you are having a reaction to a medicine you are taking. Have headaches that keep coming back (recurring). Feel dizzy. Have swelling in your ankles. Have trouble with your vision. Get help right away if you: Develop a severe headache or confusion. Have unusual weakness or numbness. Feel faint. Have severe pain in your chest or abdomen. Vomit repeatedly. Have trouble breathing. These symptoms may be an emergency. Get help right away. Call 911. Do not wait to see if the symptoms will go away. Do not drive yourself to the hospital. Summary Hypertension is when the force of blood pumping through your arteries is too strong. If this condition is not controlled, it may put you at risk for serious complications. Your personal target blood pressure may vary depending on your medical conditions, your age, and other factors. For most people, a normal blood pressure is less than 120/80. Hypertension is treated with lifestyle changes, medicines, or a combination of both. Lifestyle changes include losing weight, eating a healthy,   low-sodium diet, exercising more, and limiting alcohol. This information is not intended to replace advice given to you by your health care provider. Make sure you discuss any questions you have with your health care provider. Document Revised: 03/08/2021 Document Reviewed: 03/08/2021 Elsevier Patient Education  2023 [employer] Inc[employer]  

## 2022-04-13 NOTE — Progress Notes (Signed)
Future Appointments  Date Time Provider Department  06/20/2022  9:30 AM Lucky Cowboy, MD [facility]  12/11/2022 11:00 AM Lucky Cowboy, MD [facility]  04/09/2023  4:00 PM Olivia Mackie, NP [facility]    History of Present Illness:      This very nice 56 y.o. MWF with  HTN, HLD, T2_NIDDM  and Vitamin D Deficiency seen 4 days ago for  R postero-lateral Rt knee pains. &  treated with a steroid pulse taper. She admits slight improvement. She is scheduled to de Orthopedist - Dr Althea Charon tomorrow .                                                      She returns for short 4 day f/u because of elevated BPs 190/90 & 230/100.   Her Losartan was changed to Olmesartan 40 mg at night.   She continues with Atenolol 100 mg & HCTZ 25 mg qam.  She also was given a Rx Clonidine 0.1 mg, but  to was advised only take prn Sys BP >>170  . She reports Sys BPs in the range 130-160 .   Medications    levothyroxine (SYNTHROID) 112 MCG tablet, 1 TAB DAILY    metFORMIN (GLUCOPHAGE-XR) 500 MG 24 hr tablet, TAKE 2 TABLETS 2 X /DAY WITH MEALS   atenolol (TENORMIN) 100 MG tablet, Take one tablet daily    fenofibrate micronized (LOFIBRA) 134 MG capsule, TAKE 1 CAPSULE DAILY    hydrochlorothiazide (HYDRODIURIL) 25 MG tablet, TAKE 1 TABLET EVERY MORNING    losartan (COZAAR) 100 MG tablet, TAKE 1 TABLET DAILY    rosuvastatin (CRESTOR) 10 MG tablet, Take 1 tablet Daily for Cholesterol   acetaminophen (TYLENOL) 500 MG tablet, Take 500 mg  every 6 (six) hours as needed.   Cyanocobalamin (VITAMIN B-12 PO), Take    ALPRAZolam (XANAX) 0.5 MG tablet, Take  1/2 to 1 tablet  2 to 3 x /day   as needed for Anxiety   Cholecalciferol 5000 u, Take daily.  Problem list She has Essential hypertension; Hypothyroidism; Hyperlipidemia associated with type 2 diabetes mellitus (HCC); Irregular periods/menstrual cycles; Vitamin D deficiency; Medication management; BMI 28.0-28.9,adult; FHx: heart disease; Type 2 diabetes mellitus  with stage 2 chronic kidney disease, without long-term current use of insulin (HCC); and Hepatic steatosis on their problem list.   Observations/Objective:  BP (!) 218/90   Pulse 71   Temp (!) 97.3 F (36.3 C)   Ht 5\' 1"  (1.549 m)   Wt 146 lb (66.2 kg)   LMP 09/06/2016   SpO2 98%   BMI 27.59 kg/m   Recheck BP 185/80   P 63 by patient's CVS cuff & recheck omron arm cuff 200/84   BP 200/84    P 68    by Omron arm cuff 68  HEENT - WNL. Neck - supple.  Chest - Clear equal BS. Cor - Nl HS. RRR w/o sig MGR. PP 1(+). No edema. MS- FROM w/o deformities.  Gait Nl. Rt Knee - Nl ROM . No Crepitus. No obvious effusion or synovitis. Sl tender around the postero-lateral aspect pf the knee. Marland Kitchen No palpable popliteal cyst.  Neuro -  Nl w/o focal abnormalities.   Assessment and Plan:   1. Accelerated hypertension   - cloNIDine  0.1 MG tablet; Take 1 tablet 3 x /day with Meals for BP for Sys BP > 170    - Advised monitor BP 2 x /day    2. Posterior right knee pain  - dexamethasone 4 MG tablet; Take 1 tab 3 x day - 3 days, then 2 x day - 3 days, then 1 tab daily  Dispense: 20 tablet  3. Hypothyroidism  -  Refilled  - levothyroxine 112 MCG tablet   Follow Up Instructions:        I discussed the assessment and treatment plan with the patient. The patient was provided an opportunity to ask questions and all were answered. The patient agreed with the plan and demonstrated an understanding of the instructions.       The patient was advised to call back or seek an in-person evaluation if the symptoms worsen or if the condition fails to improve as anticipated.    Marinus Maw, MD

## 2022-04-14 DIAGNOSIS — M1711 Unilateral primary osteoarthritis, right knee: Secondary | ICD-10-CM | POA: Diagnosis not present

## 2022-04-14 DIAGNOSIS — M25561 Pain in right knee: Secondary | ICD-10-CM | POA: Diagnosis not present

## 2022-04-30 ENCOUNTER — Encounter: Payer: Self-pay | Admitting: Internal Medicine

## 2022-06-07 ENCOUNTER — Encounter: Payer: Self-pay | Admitting: Internal Medicine

## 2022-06-19 ENCOUNTER — Encounter: Payer: Self-pay | Admitting: Internal Medicine

## 2022-06-19 NOTE — Progress Notes (Signed)
Future Appointments  Date Time Provider Department  06/20/2022  9:30 AM Unk Pinto, MD [facility]  12/11/2022                     cpe 11:00 AM Unk Pinto, MD [facility]  04/09/2023  4:00 PM Tamela Gammon, NP [facility]      History of Present Illness:       This very nice 57 y.o. MWF presents for 6  month follow up with HTN, HLD, T2_NIDDM and Vitamin D Deficiency.        Patient is treated for HTN  circa 2000   & patient alleges  home BP checks have  been controlled . Today's BP was very elevated by Nurse at 220/80  (!) and was down to 186/82 by my recheck. Patient has had no complaints of any cardiac type chest pain, palpitations, dyspnea Vertell Limber /PND, dizziness, claudication or dependent edema.       Patient refuses to take or is sporadic on  meds for Cholesterol  & her hyperlipidemia is not controlled with diet & fenofibrate.  Last Lipids were not at goal :  Lab Results  Component Value Date   CHOL 154 03/14/2022   HDL 28 (L) 03/14/2022   LDLCALC 86 03/14/2022   TRIG 333 (H) 03/14/2022   CHOLHDL 5.5 (H) 03/14/2022     Also, the patient has history of T2_NIDDM (A1c 6.5% /2019) w/CKD2 (GFR 84) and has had no symptoms of reactive hypoglycemia, diabetic polys, paresthesias or visual blurring.  Last A1c was not at goal :  Lab Results  Component Value Date   HGBA1C 6.7 (H) 03/14/2022                                                                     Patient was dx'd Hypothyroid in 1997 & has been has been  on  thyroid   replacement  since then .                                                                                                            Further, the patient also has history of Vitamin D Deficiency ("20" /2013 & "16" /2014) and  sporadically supplements vitamin D. Last vitamin D was still low :   Lab Results  Component Value Date   VD25OH 47 12/05/2021        Current Outpatient Medications  Medication Instructions    acetaminophen (TYLENOL) 500 mg, Oral, Every 6 hours PRN   ALPRAZolam (XANAX) 0.5 MG tablet Take     1/2 to 1 tab 2 to 3 x /day   as needed for Anxiety   atenolol (TENORMIN) 100 MG tablet Take one tablet daily for blood pressure.   Cholecalciferol 5,000 Units, Oral, Daily   cloNIDine (  CATAPRES) 0.1 MG tablet Take 1 tablet 3 x /day with Meals for BP   Cyanocobalamin (VITAMIN B-12 PO) Oral   fenofibrate micronized 134 MG capsule Take  1 capsule  Daily    hydrochlorothiazide  25 MG tablet TAKE 1 TABLET EVERY MORNING    levothyroxine  112 MCG tablet Take  1 tablet  Daily     metFORMIN -XR 500 MG 24 hr tablet TAKE 2 TABLETS 2 X /DAY WITH MEALS    olmesartan (40 MG tablet Take  1 tablet  every Night  for BP & Diabetic Kidney Protection   rosuvastatin 10 MG tablet Take 1 tablet Daily       Allergies  Allergen Reactions   Codeine    Ppd [Tuberculin Purified Protein Derivative]     Positive reaction 2001   Lisinopril     Dry cough    PMHx:   Past Medical History:  Diagnosis Date   Hypertension    Hypothyroidism    Splenomegaly 02/14/2019   02/14/2019 abd [place]: Spleen measures 14.1 x 15.6 x 4.7 cm with a measured splenic volume of 542 cubic cm. No focal splenic lesions are evident.     Immunization History  Administered Date(s) Administered   Influenza Inj Mdck Quad With Preservative 03/22/2020   Influenza,inj,Quad PF,6+ Mos 04/01/2019, 03/02/2021   PFIZER(Purple Top)SARS-COV-2 Vaccination 08/23/2019, 09/13/2019, 05/11/2020, 12/03/2020   Td 06/29/2001, 07/31/2012   Tdap 07/31/2012     Past Surgical History:  Procedure Laterality Date   LYMPH NODE DISSECTION     NECK- BENIGN    FHx:    Reviewed / unchanged  SHx:    Reviewed / unchanged   Systems Review:  Constitutional: Denies fever, chills, wt changes, headaches, insomnia, fatigue, night sweats, change in appetite. Eyes: Denies redness, blurred vision, diplopia, discharge, itchy, watery eyes.  ENT: Denies discharge,  congestion, post nasal drip, epistaxis, sore throat, earache, hearing loss, dental pain, tinnitus, vertigo, sinus pain, snoring.  CV: Denies chest pain, palpitations, irregular heartbeat, syncope, dyspnea, diaphoresis, orthopnea, PND, claudication or edema. Respiratory: denies cough, dyspnea, DOE, pleurisy, hoarseness, laryngitis, wheezing.  Gastrointestinal: Denies dysphagia, odynophagia, heartburn, reflux, water brash, abdominal pain or cramps, nausea, vomiting, bloating, diarrhea, constipation, hematemesis, melena, hematochezia  or hemorrhoids. Genitourinary: Denies dysuria, frequency, urgency, nocturia, hesitancy, discharge, hematuria or flank pain. Musculoskeletal: Denies arthralgias, myalgias, stiffness, jt. swelling, pain, limping or strain/sprain.  Skin: Denies pruritus, rash, hives, warts, acne, eczema or change in skin lesion(s). Neuro: No weakness, tremor, incoordination, spasms, paresthesia or pain. Psychiatric: Denies confusion, memory loss or sensory loss. Endo: Denies change in weight, skin or hair change.  Heme/Lymph: No excessive bleeding, bruising or enlarged lymph nodes.  Physical Exam  Pulse 67   Temp 97.9 F (36.6 C)   Resp 17   Ht 5' 1"$  (1.549 m)   Wt 146 lb 6.4 oz (66.4 kg)   LMP 09/06/2016   SpO2 99%   BMI 27.66 kg/m   Appears  over nourished, well groomed  and in no distress.  Eyes: PERRLA, EOMs, conjunctiva no swelling or erythema. Sinuses: No frontal/maxillary tenderness ENT/Mouth: EAC's clear, TM's nl w/o erythema, bulging. Nares clear w/o erythema, swelling, exudates. Oropharynx clear without erythema or exudates. Oral hygiene is good. Tongue normal, non obstructing. Hearing intact.  Neck: Supple. Thyroid not palpable. Car 2+/2+ without bruits, nodes or JVD. Chest: Respirations nl with BS clear & equal w/o rales, rhonchi, wheezing or stridor.  Cor: Heart sounds normal w/ regular rate and rhythm without sig. murmurs, gallops,  clicks or rubs. Peripheral  pulses normal and equal  without edema.  Abdomen: Soft & bowel sounds normal. Non-tender w/o guarding, rebound, hernias, masses or organomegaly.  Lymphatics: Unremarkable.  Musculoskeletal: Full ROM all peripheral extremities, joint stability, 5/5 strength and normal gait.  Skin: Warm, dry without exposed rashes, lesions or ecchymosis apparent.  Neuro: Cranial nerves intact, reflexes equal bilaterally. Sensory-motor testing grossly intact. Tendon reflexes grossly intact.  Pysch: Alert & oriented x 3.  Insight and judgement nl & appropriate. No ideations.  Assessment and Plan:  1. Essential hypertension  -  Add Minoxidil  10 mg qd & monitor blood pressure at home.   - Call if elevated over 145/90.  - Continue DASH diet.  Reminder to go to the ER if any CP,  SOB, nausea, dizziness, severe HA, changes vision/speech.   - CBC with Differential/Platelet - COMPLETE METABOLIC PANEL WITH GFR - Magnesium - TSH  2. Hyperlipidemia associated with type 2 diabetes mellitus ([place])  - Continue diet/meds, exercise,& lifestyle modifications.  - Continue monitor periodic cholesterol/liver & renal functions    - Lipid panel - TSH  3. Type 2 diabetes mellitus with stage 2 chronic kidney  disease, without long-term current use of insulin (HCC)  - Continue diet, exercise  - Lifestyle modifications.  - Monitor appropriate labs   - Hemoglobin A1c - Insulin, random  4. Vitamin D deficiency  - Continue supplementation    - VITAMIN D 25 Hydroxy   5. Hypothyroidism  - TSH  6. Medication management  - CBC with Differential/Platelet - COMPLETE METABOLIC PANEL WITH GFR - Magnesium - Lipid panel - TSH - Hemoglobin A1c - Insulin, random - VITAMIN D 25 Hydroxy          Discussed  regular exercise, BP monitoring, weight control to achieve/maintain BMI less than 25 and discussed med and SE's. Recommended labs to assess and monitor clinical status with further disposition pending results of  labs.  I discussed the assessment and treatment plan with the patient. The patient was provided an opportunity to ask questions and all were answered. The patient agreed with the plan and demonstrated an understanding of the instructions.  I provided over 30 minutes of exam, counseling, chart review and  complex critical decision making.        The patient was advised to call back or seek an in-person evaluation if the symptoms worsen or if the condition fails to improve as anticipated.   Kirtland Bouchard, MD

## 2022-06-19 NOTE — Patient Instructions (Signed)

## 2022-06-20 ENCOUNTER — Ambulatory Visit: Payer: BC Managed Care – PPO | Admitting: Internal Medicine

## 2022-06-20 ENCOUNTER — Encounter: Payer: Self-pay | Admitting: Internal Medicine

## 2022-06-20 VITALS — BP 220/80 | HR 67 | Temp 97.9°F | Resp 17 | Ht 61.0 in | Wt 146.4 lb

## 2022-06-20 DIAGNOSIS — I10 Essential (primary) hypertension: Secondary | ICD-10-CM | POA: Diagnosis not present

## 2022-06-20 DIAGNOSIS — N182 Chronic kidney disease, stage 2 (mild): Secondary | ICD-10-CM

## 2022-06-20 DIAGNOSIS — E1169 Type 2 diabetes mellitus with other specified complication: Secondary | ICD-10-CM | POA: Diagnosis not present

## 2022-06-20 DIAGNOSIS — E785 Hyperlipidemia, unspecified: Secondary | ICD-10-CM

## 2022-06-20 DIAGNOSIS — E559 Vitamin D deficiency, unspecified: Secondary | ICD-10-CM

## 2022-06-20 DIAGNOSIS — Z79899 Other long term (current) drug therapy: Secondary | ICD-10-CM | POA: Diagnosis not present

## 2022-06-20 DIAGNOSIS — E1122 Type 2 diabetes mellitus with diabetic chronic kidney disease: Secondary | ICD-10-CM | POA: Diagnosis not present

## 2022-06-20 DIAGNOSIS — E039 Hypothyroidism, unspecified: Secondary | ICD-10-CM

## 2022-06-20 MED ORDER — MINOXIDIL 10 MG PO TABS: ORAL_TABLET | ORAL | 3 refills | Status: DC

## 2022-06-21 ENCOUNTER — Encounter: Payer: Self-pay | Admitting: Internal Medicine

## 2022-06-21 LAB — COMPLETE METABOLIC PANEL WITH GFR
AG Ratio: 1.7 (calc) (ref 1.0–2.5)
ALT: 26 U/L (ref 6–29)
AST: 22 U/L (ref 10–35)
Albumin: 4.7 g/dL (ref 3.6–5.1)
Alkaline phosphatase (APISO): 35 U/L — ABNORMAL LOW (ref 37–153)
BUN: 15 mg/dL (ref 7–25)
CO2: 29 mmol/L (ref 20–32)
Calcium: 10.1 mg/dL (ref 8.6–10.4)
Chloride: 101 mmol/L (ref 98–110)
Creat: 0.92 mg/dL (ref 0.50–1.03)
Globulin: 2.8 g/dL (calc) (ref 1.9–3.7)
Glucose, Bld: 128 mg/dL — ABNORMAL HIGH (ref 65–99)
Potassium: 4.3 mmol/L (ref 3.5–5.3)
Sodium: 139 mmol/L (ref 135–146)
Total Bilirubin: 0.5 mg/dL (ref 0.2–1.2)
Total Protein: 7.5 g/dL (ref 6.1–8.1)
eGFR: 73 mL/min/{1.73_m2} (ref >=60)

## 2022-06-21 LAB — CBC WITH DIFFERENTIAL/PLATELET
Absolute Monocytes: 606 cells/uL (ref 200–950)
Basophils Absolute: 33 cells/uL (ref 0–200)
Basophils Relative: 0.4 %
Eosinophils Absolute: 249 cells/uL (ref 15–500)
Eosinophils Relative: 3 %
HCT: 38 % (ref 35.0–45.0)
Hemoglobin: 12.6 g/dL (ref 11.7–15.5)
Lymphs Abs: 2141 cells/uL (ref 850–3900)
MCH: 28.2 pg (ref 27.0–33.0)
MCHC: 33.2 g/dL (ref 32.0–36.0)
MCV: 85 fL (ref 80.0–100.0)
MPV: 10.1 fL (ref 7.5–12.5)
Monocytes Relative: 7.3 %
Neutro Abs: 5271 cells/uL (ref 1500–7800)
Neutrophils Relative %: 63.5 %
Platelets: 295 10*3/uL (ref 140–400)
RBC: 4.47 10*6/uL (ref 3.80–5.10)
RDW: 13.9 % (ref 11.0–15.0)
Total Lymphocyte: 25.8 %
WBC: 8.3 10*3/uL (ref 3.8–10.8)

## 2022-06-21 LAB — TSH: TSH: 0.19 mIU/L — ABNORMAL LOW (ref 0.40–4.50)

## 2022-06-21 LAB — LIPID PANEL
Cholesterol: 197 mg/dL (ref <200)
HDL: 37 mg/dL — ABNORMAL LOW (ref >=50)
LDL Cholesterol (Calc): 112 mg/dL (calc) — ABNORMAL HIGH
Non-HDL Cholesterol (Calc): 160 mg/dL (calc) — ABNORMAL HIGH (ref <130)
Total CHOL/HDL Ratio: 5.3 (calc) — ABNORMAL HIGH (ref <5.0)
Triglycerides: 335 mg/dL — ABNORMAL HIGH (ref <150)

## 2022-06-21 LAB — MAGNESIUM: Magnesium: 2.1 mg/dL (ref 1.5–2.5)

## 2022-06-21 LAB — VITAMIN D 25 HYDROXY (VIT D DEFICIENCY, FRACTURES): Vit D, 25-Hydroxy: 54 ng/mL (ref 30–100)

## 2022-06-21 LAB — HEMOGLOBIN A1C
Hgb A1c MFr Bld: 6.7 % of total Hgb — ABNORMAL HIGH (ref <5.7)
Mean Plasma Glucose: 146 mg/dL
eAG (mmol/L): 8.1 mmol/L

## 2022-06-21 LAB — INSULIN, RANDOM: Insulin: 17.9 u[IU]/mL

## 2022-06-21 NOTE — Progress Notes (Signed)
<><><><><><><><><><><><><><><><><><><><><><><><><><><><><><><><><> <><><><><><><><><><><><><><><><><><><><><><><><><><><><><><><><><> - Test results slightly outside the reference range are not unusual. If there is anything important, I will review this with you,  otherwise it is considered normal test values.  If you have further questions,  please do not hesitate to contact me at the office or via My Chart.  <><><><><><><><><><><><><><><><><><><><><><><><><><><><><><><><><> <><><><><><><><><><><><><><><><><><><><><><><><><><><><><><><><><>   -  Total  Chol =197     - Elevated             (  Ideal  or  Goal is less than 180  !  )  & -  Bad / Dangerous LDL  Chol =  112  - also Elevated              (  Ideal  or  Goal is less than 70  !  )   - Cholesterol is too high - Recommend low cholesterol diet   - Cholesterol only comes from animal sources                                                                              - ie. meat, dairy, egg yolks  - Eat all the vegetables you want.                                                             - Avoid Meat, Avoid Meat,  Avoid Meat          - especially avoid Red Meat - Beef AND Pork .  - Avoid cheese & dairy - milk & ice cream.     - Cheese is the most concentrated form of trans-fats which  is the worst thing to clog up our arteries.   - Veggie cheese is OK which can be found in the fresh  produce section at [employer] or [employer] or [employer]  <><><><><><><><><><><><><><><><><><><><><><><><><><><><><><><><><> <><><><><><><><><><><><><><><><><><><><><><><><><><><><><><><><><>   - Thyroid is slightly off , please be sure to take    on an empty stomach                                                          with only water for 30 minutes &                                                         no Antacid meds, Calcium or Magnesium  for 4 hours & avoid  Biotin   -very slight - not enough to change med dosage  - will continue to monitor closely.   <><><><><><><><><><><><><><><><><><><><><><><><><><><><><><><><><> <><><><><><><><><><><><><><><><><><><><><><><><><><><><><><><><><>  -  A1c = 6.7 % - too high   - Need to work harder on diet &U lose weight  Being diabetic has a  300% increased risk for heart attack,  stroke, cancer, and alzheimer- type vascular dementia.   It is very important that you work harder with diet by  avoiding all foods that are white except chicken,  fish & calliflower.  - Avoid white rice  (brown & wild rice is OK),   - Avoid white potatoes  (sweet potatoes in moderation is OK),   White bread or wheat bread or anything made out of   white flour like bagels, donuts, rolls, buns, biscuits, cakes,  - pastries, cookies, pizza crust, and pasta (made from  white flour & egg whites)   - vegetarian pasta or spinach or wheat pasta is OK.  - Multigrain breads like Arnold's, [employer] or   multigrain sandwich thins or high fiber breads like   Eureka bread or "Dave's Killer" breads that are  4 to 5 grams fiber per slice !  are best.    Diet, exercise and weight loss can reverse and cure  diabetes in the early stages.    - Diet, exercise and weight loss is very important in the   control and prevention of complications of diabetes which  affects every system in your body, ie.   -Brain - dementia/stroke,  - eyes - glaucoma/blindness,  - heart - heart attack/heart failure,  - kidneys - dialysis,  - stomach - gastric paralysis,  - intestines - malabsorption,  - nerves - severe painful neuritis,  - circulation - gangrene & loss of a leg(s)  - and finally  . . . . . . . . . . . . . . . . . .    - cancer and Alzheimers.  <><><><><><><><><><><><><><><><><><><><><><><><><><><><><><><><><> <><><><><><><><><><><><><><><><><><><><><><><><><><><><><><><><><>  - Vit D = 54 - OK  - Please keeep dose  same   <><><><><><><><><><><><><><><><><><><><><><><><><><><><><><><><><> <><><><><><><><><><><><><><><><><><><><><><><><><><><><><><><><><>  All Else - CBC - Kidneys - Electrolytes - Liver - Magnesium & Thyroid    - all  Normal / OK  <><><><><><><><><><><><><><><><><><><><><><><><><><><><><><><><><> <><><><><><><><><><><><><><><><><><><><><><><><><><><><><><><><><>

## 2022-06-25 ENCOUNTER — Encounter: Payer: Self-pay | Admitting: Internal Medicine

## 2022-06-30 ENCOUNTER — Other Ambulatory Visit: Payer: Self-pay

## 2022-06-30 DIAGNOSIS — E039 Hypothyroidism, unspecified: Secondary | ICD-10-CM

## 2022-06-30 DIAGNOSIS — I10 Essential (primary) hypertension: Secondary | ICD-10-CM

## 2022-06-30 MED ORDER — ATENOLOL 100 MG PO TABS: ORAL_TABLET | ORAL | 1 refills | Status: DC

## 2022-06-30 MED ORDER — LEVOTHYROXINE SODIUM 112 MCG PO TABS: ORAL_TABLET | ORAL | 3 refills | Status: DC

## 2022-07-28 ENCOUNTER — Encounter: Payer: Self-pay | Admitting: Internal Medicine

## 2022-08-07 ENCOUNTER — Encounter: Payer: Self-pay | Admitting: Nurse Practitioner

## 2022-08-07 ENCOUNTER — Ambulatory Visit (INDEPENDENT_AMBULATORY_CARE_PROVIDER_SITE_OTHER): Payer: BC Managed Care – PPO | Admitting: Nurse Practitioner

## 2022-08-07 VITALS — BP 180/82 | HR 66 | Temp 99.7°F | Ht 61.0 in | Wt 146.0 lb

## 2022-08-07 DIAGNOSIS — I10 Essential (primary) hypertension: Secondary | ICD-10-CM | POA: Diagnosis not present

## 2022-08-07 DIAGNOSIS — E039 Hypothyroidism, unspecified: Secondary | ICD-10-CM | POA: Diagnosis not present

## 2022-08-07 DIAGNOSIS — E785 Hyperlipidemia, unspecified: Secondary | ICD-10-CM

## 2022-08-07 DIAGNOSIS — Z79899 Other long term (current) drug therapy: Secondary | ICD-10-CM

## 2022-08-07 DIAGNOSIS — R7989 Other specified abnormal findings of blood chemistry: Secondary | ICD-10-CM | POA: Diagnosis not present

## 2022-08-07 DIAGNOSIS — E782 Mixed hyperlipidemia: Secondary | ICD-10-CM

## 2022-08-07 DIAGNOSIS — E1169 Type 2 diabetes mellitus with other specified complication: Secondary | ICD-10-CM

## 2022-08-07 MED ORDER — CLONIDINE HCL 0.1 MG PO TABS: ORAL_TABLET | ORAL | 3 refills | Status: DC

## 2022-08-07 MED ORDER — ROSUVASTATIN CALCIUM 10 MG PO TABS: ORAL_TABLET | ORAL | 3 refills | Status: DC

## 2022-08-07 NOTE — Patient Instructions (Signed)
Blood Pressure Record Sheet To take your blood pressure, you will need a blood pressure machine. You may be prescribed one, or you can buy a blood pressure machine (blood pressure monitor) at your clinic, drug store, or online. When choosing one, look for these features: An automatic monitor that has an arm cuff. A cuff that wraps snugly, but not too tightly, around your upper arm. You should be able to fit only one finger between your arm and the cuff. A device that stores blood pressure reading results. Do not choose a monitor that measures your blood pressure from your wrist or finger. Follow your health care provider's instructions for how to take your blood pressure. To use this form: Get one reading in the morning (a.m.) before you take any medicines. Get one reading in the evening (p.m.) before supper. Take at least two readings with each blood pressure check. This makes sure the results are correct. Wait 1-2 minutes between measurements. Write down the results in the spaces on this form. Repeat this once a week, or as told by your health care provider. Make a follow-up appointment with your health care provider to discuss the results. Blood pressure log Date: _______________________ a.m. _____________________(1st reading) _____________________(2nd reading) p.m. _____________________(1st reading) _____________________(2nd reading) Date: _______________________ a.m. _____________________(1st reading) _____________________(2nd reading) p.m. _____________________(1st reading) _____________________(2nd reading) Date: _______________________ a.m. _____________________(1st reading) _____________________(2nd reading) p.m. _____________________(1st reading) _____________________(2nd reading) Date: _______________________ a.m. _____________________(1st reading) _____________________(2nd reading) p.m. _____________________(1st reading) _____________________(2nd reading) Date:  _______________________ a.m. _____________________(1st reading) _____________________(2nd reading) p.m. _____________________(1st reading) _____________________(2nd reading) This information is not intended to replace advice given to you by your health care provider. Make sure you discuss any questions you have with your health care provider. Document Revised: 01/13/2021 Document Reviewed: 01/13/2021 Elsevier Patient Education  2023 Elsevier Inc.   

## 2022-08-07 NOTE — Progress Notes (Signed)
Assessment and Plan:  Allison Contreras was seen today for an episodic visit.  Diagnoses and all order for this visit:  Hypothyroidism, unspecified type Below goal 06/20/22 - Possible contributor to elevated BP. Check levels today for medication management.  Discussed how thyroid hormones are involved in regulating HR and maintaining blood vessel health. Continue to monitor.    - TSH  Low TSH level  - TSH  Medication management All medications discussed and reviewed in full. All questions and concerns regarding medications addressed.    - TSH  Essential hypertension Continues to be elevated in clinic Instructed to take Clonidine as directed if SBP>170 Continue current medications as directed until TSH level returns. Discussed possible adjustment to anti-hypertensive's. Review of BP log reveals normal MAP readings.  New BP log provided - record readings aim for <130/80. Continue to monitor  Report to ER for any increase in stroke like symptoms, including HA, N/V, paralysis, difficulty speaking, trouble walking, confusion, vision changes, CP, heart palpitations, SOB, diaphoresis.  Hyperlipidemia Continue Rosuvastatin Discussed lifestyle modifications. Recommended diet heavy in fruits and veggies, omega 3's. Decrease consumption of animal meats, cheeses, and dairy products. Remain active and exercise as tolerated. Continue to monitor. Check lipids/TSH   Meds ordered this encounter  Medications   cloNIDine (CATAPRES) 0.1 MG tablet    Sig: Take 1 tablet 3 x /day with Meals for BP    Dispense:  270 tablet    Refill:  3    PLEASE   send refill requests electronically by eScripts    Order Specific Question:   Supervising Provider    Answer:   Unk Pinto [id number]   rosuvastatin (CRESTOR) 10 MG tablet    Sig: Take 1 tablet Daily for Cholesterol    Dispense:  90 tablet    Refill:  3    Order Specific Question:   Supervising Provider    Answer:   Unk Pinto  [phone]   Notify office for further evaluation and treatment, questions or concerns if s/s fail to improve. The risks and benefits of my recommendations, as well as other treatment options were discussed with the patient today. Questions were answered.  Further disposition pending results of labs. Discussed med's effects and SE's.    Over 20 minutes of exam, counseling, chart review, and critical decision making was performed.   Future Appointments  Date Time Provider [place]  09/19/2022 10:30 AM Alycia Rossetti, NP [facility] None  12/26/2022  3:00 PM Unk Pinto, MD [facility] None  04/09/2023  4:00 PM Tamela Gammon, NP [facility] None    ------------------------------------------------------------------------------------------------------------------   HPI BP (!) 200/90   Pulse 66   Temp 99.7 F (37.6 C)   Ht 5\' 1"  (1.549 m)   Wt 146 lb (66.2 kg)   LMP 09/06/2016   SpO2 99%   BMI 27.59 kg/m   57 y.o.female presents for evaluation of in home BP recordings.  She is concerned for difference in systolic and diastolic readings.  Feeling also as though her diastolic pressure is running too low.    She was recently switched from Losartan to Olmesartan.  She takes Olmesartan at night.  She continues Atenolol and  HCTZ during the day.  She is asymptomatic.  Of note, her last TSH level on 06/20/22 was below normal at 0.19.  No changes made to medications during that time.  She is continuing to take Levothyroxine 112 mcg daily.    Past Medical History:  Diagnosis Date   Hypertension  Hypothyroidism    Splenomegaly 02/14/2019   02/14/2019 abd [place]: Spleen measures 14.1 x 15.6 x 4.7 cm with a measured splenic volume of 542 cubic cm. No focal splenic lesions are evident.     Allergies  Allergen Reactions   Codeine    Ppd [Tuberculin Purified Protein Derivative]     Positive reaction 2001   Lisinopril     Dry cough    Current Outpatient Medications on File Prior to  Visit  Medication Sig   acetaminophen (TYLENOL) 500 MG tablet Take 500 mg by mouth every 6 (six) hours as needed.   ALPRAZolam (XANAX) 0.5 MG tablet Take     1/2 to 1 tablet          2 to 3 x /day            as needed for Anxiety   atenolol (TENORMIN) 100 MG tablet Take one tablet daily for blood pressure.   Cholecalciferol 125 MCG (5000 UT) capsule Take 5,000 Units by mouth daily.   fenofibrate micronized (LOFIBRA) 134 MG capsule Take  1 capsule  Daily  for Triglycerides  ( Blood Fats )   glucose blood (ONETOUCH VERIO) test strip Check blood sugar Daily  ( Dx:  e.11 )   hydrochlorothiazide (HYDRODIURIL) 25 MG tablet TAKE 1 TABLET EVERY MORNING FOR BLOOD PRESSURE & FLUID RETENTION / ANKLE SWELLING   Lancets (ONETOUCH DELICA PLUS Q000111Q) MISC USE TO TEST BLOOD SUGAR ONCE DAILY   levothyroxine (SYNTHROID) 112 MCG tablet Take  1 tablet  Daily  on an empty stomach with only water for 30 minutes & no Antacid meds, Calcium or Magnesium for 4 hours & avoid Biotin   metFORMIN (GLUCOPHAGE-XR) 500 MG 24 hr tablet TAKE 2 TABLETS 2 X /DAY WITH MEALS FOR DIABETES   olmesartan (BENICAR) 40 MG tablet Take  1 tablet  every Night  for BP & Diabetic Kidney Protection   rosuvastatin (CRESTOR) 10 MG tablet Take 1 tablet Daily for Cholesterol   Cyanocobalamin (VITAMIN B-12 PO) Take by mouth. (Patient not taking: Reported on 08/07/2022)   minoxidil (LONITEN) 10 MG tablet Take 1 tablet  Daily for BP (Patient not taking: Reported on 08/07/2022)   No current facility-administered medications on file prior to visit.    ROS: all negative except what is noted in the HPI.   Physical Exam:  BP (!) 200/90   Pulse 66   Temp 99.7 F (37.6 C)   Ht 5\' 1"  (1.549 m)   Wt 146 lb (66.2 kg)   LMP 09/06/2016   SpO2 99%   BMI 27.59 kg/m   General Appearance: NAD.  Awake, conversant and cooperative. Eyes: PERRLA, EOMs intact.  Sclera white.  Conjunctiva without erythema. Sinuses: No frontal/maxillary tenderness.  No  nasal discharge. Nares patent.  ENT/Mouth: Ext aud canals clear.  Bilateral TMs w/DOL and without erythema or bulging. Hearing intact.  Posterior pharynx without swelling or exudate.  Tonsils without swelling or erythema.  Neck: Supple.  No masses, nodules or thyromegaly. Respiratory: Effort is regular with non-labored breathing. Breath sounds are equal bilaterally without rales, rhonchi, wheezing or stridor.  Cardio: RRR with no MRGs. Brisk peripheral pulses without edema.  Abdomen: Active BS in all four quadrants.  Soft and non-tender without guarding, rebound tenderness, hernias or masses. Lymphatics: Non tender without lymphadenopathy.  Musculoskeletal: Full ROM, 5/5 strength, normal ambulation.  No clubbing or cyanosis. Skin: Appropriate color for ethnicity. Warm without rashes, lesions, ecchymosis, ulcers.  Neuro: CN II-XII grossly normal.  Normal muscle tone without cerebellar symptoms and intact sensation.   Psych: AO X 3,  appropriate mood and affect, insight and judgment.     Darrol Jump, NP 12:51 PM [facility]

## 2022-08-08 ENCOUNTER — Other Ambulatory Visit: Payer: Self-pay | Admitting: Nurse Practitioner

## 2022-08-08 ENCOUNTER — Encounter: Payer: Self-pay | Admitting: Nurse Practitioner

## 2022-08-08 DIAGNOSIS — E039 Hypothyroidism, unspecified: Secondary | ICD-10-CM

## 2022-08-08 DIAGNOSIS — R7989 Other specified abnormal findings of blood chemistry: Secondary | ICD-10-CM

## 2022-08-08 LAB — TSH: TSH: 0.09 mIU/L — ABNORMAL LOW (ref 0.40–4.50)

## 2022-09-18 NOTE — Progress Notes (Unsigned)
FOLLOW UP 3 MONTH  Assessment and Plan:    Hypertension Taking losartan 100mg  night, atenolol 100mg  daily, HCTZ 25mg  tab only; increase to whole tab Hx of intolerance with olmesartan, lisinopril  Continue atenolol, HCTZ Recheck in 2-4 weeks Monitor blood pressure at home; patient to call if consistently greater than 130/80 Continue DASH diet.   Reminder to go to the ER if any CP, SOB, nausea, dizziness, severe HA, changes vision/speech, left arm numbness and tingling and jaw pain. CBC  Hyperlipidemia associated with Type 2 Diabetes Mellitus(HCC) Taking fenofibrate 145mg  daily and rosuvastatin 20mg    LDL goal <70 for T2DM, prescribed rosuvastatin but not taking; discussed at length and agrees to start; stop fenofibrate and do rosuvastatin only if any muscle aches Continue low cholesterol diet and exercise.  Check lipid panel.  CMP  Type 2 Diabetes mellitus associated with Stage 2 CKD(HCC) Metformin 500 mg TID and doing well   Continue diet and exercise.  Perform daily foot/skin check, notify office of any concerning changes.  Check A1C  Stage 2 CKD associated with Type 2 Diabetes Mellitus(HCC) Increase fluids, avoid NSAIDS, monitor sugars, will monitor  -CMP  Overweight-BMI 28.35 Long discussion about weight loss, diet, and exercise Recommended diet heavy in fruits and veggies and low in animal meats, cheeses, and dairy products, appropriate calorie intake Discussed ideal weight for height  Will follow up in 3 months  Right otitis media Amoxicillin 400 mg/51ml TID x 10 days If symptoms do not resolve notify the office  Hypothyroidism Taking levothyroxine 112 mcg daily Reminder to take on an empty stomach 30-47mins before first meal of the day. No antacid medications for 4 hours. TSH  Anxiety Currently only using Xanax if she flys Well controlled with behavior modification  Vitamin D Def Below goal at last visit; she has initiated supplement Encouraged daily  adherence goal of 60-100   Medication Management Continued  Continue diet and meds as discussed. Further disposition pending results of labs. Discussed med's effects and SE's.   Over 30 minutes of face to face interview, exam, counseling, chart review, and critical decision making was performed.   Future Appointments  Date Time Provider Department Center  09/19/2022 10:30 AM Raynelle Dick, NP [facility] None  12/26/2022  3:00 PM Lucky Cowboy, MD [facility] None  04/09/2023  4:00 PM Olivia Mackie, NP [facility] None    ----------------------------------------------------------------------------------------------------------------------  HPI 57 y.o. female  presents for 3 month follow up on HTN, HLD, T2DM, weight, hypothyroidism and vitamin D deficiency.   She has been having right ear pain and is wondering if she has an infection.  She watches her 61 year old granddaughter.  Reports she has had some difficulties sleeping.  Has not tried using Melatonin  BMI is There is no height or weight on file to calculate BMI., she has been working on exercise, but admits not eating as healthy as she should.  Has been doing kickboxing.  Wt Readings from Last 3 Encounters:  08/07/22 146 lb (66.2 kg)  06/20/22 146 lb 6.4 oz (66.4 kg)  04/13/22 146 lb (66.2 kg)   Didn't tolerate olmesartan, unsure of reaction, lisinopril, dry cough Taking losartan 1 tab only, atenolol 100mg  QD , HCTZ 25 mg Her blood pressure has not been controlled at home (130-140s/70-80s), today their BP is   Was nervous today because car wouldn't start BP Readings from Last 3 Encounters:  08/07/22 (!) 180/82  06/20/22 (!) 220/80  04/13/22 (!) 218/90     She does  workout. She denies chest pain, shortness of breath, dizziness.   She is on cholesterol medication , fenofibrate and rosuvastatin 10 mg daily and denies myalgias. Her cholesterol is not at goal. The cholesterol last visit was:   Lab Results  Component  Value Date   CHOL 197 06/20/2022   HDL 37 (L) 06/20/2022   LDLCALC 112 (H) 06/20/2022   TRIG 335 (H) 06/20/2022   CHOLHDL 5.3 (H) 06/20/2022    She has been working on diet and exercise for T2DM  She is taking metformin 500 mg  Two tablets twice a day with meals  She denies foot ulcerations, increased appetite, nausea, paresthesia of the feet, polydipsia, polyuria, visual disturbances, vomiting and weight loss.  She checks fasting glucose 130-140s. Last A1C in the office was:  Lab Results  Component Value Date   HGBA1C 6.7 (H) 06/20/2022   Stage 2 CKD associated with T2DM, on losartan  Lab Results  Component Value Date   GFRNONAA 65 08/11/2020   She is on thyroid medication. Her medication was not changed last visit.  Taking 112 mcg daily  Lab Results  Component Value Date   TSH 0.09 (L) 08/07/2022  . Patient is on Vitamin D supplement, taking 5000 IU maybe every other day  Lab Results  Component Value Date   VD25OH 82 06/20/2022       Current Medications:  Current Outpatient Medications on File Prior to Visit  Medication Sig   acetaminophen (TYLENOL) 500 MG tablet Take 500 mg by mouth every 6 (six) hours as needed.   ALPRAZolam (XANAX) 0.5 MG tablet Take     1/2 to 1 tablet          2 to 3 x /day            as needed for Anxiety   atenolol (TENORMIN) 100 MG tablet Take one tablet daily for blood pressure.   Cholecalciferol 125 MCG (5000 UT) capsule Take 5,000 Units by mouth daily.   cloNIDine (CATAPRES) 0.1 MG tablet Take 1 tablet 3 x /day with Meals for BP   Cyanocobalamin (VITAMIN B-12 PO) Take by mouth. (Patient not taking: Reported on 08/07/2022)   fenofibrate micronized (LOFIBRA) 134 MG capsule Take  1 capsule  Daily  for Triglycerides  ( Blood Fats )   glucose blood (ONETOUCH VERIO) test strip Check blood sugar Daily  ( Dx:  e.11 )   hydrochlorothiazide (HYDRODIURIL) 25 MG tablet TAKE 1 TABLET EVERY MORNING FOR BLOOD PRESSURE & FLUID RETENTION / ANKLE SWELLING    Lancets (ONETOUCH DELICA PLUS LANCET30G) MISC USE TO TEST BLOOD SUGAR ONCE DAILY   levothyroxine (SYNTHROID) 112 MCG tablet Take  1 tablet  Daily  on an empty stomach with only water for 30 minutes & no Antacid meds, Calcium or Magnesium for 4 hours & avoid Biotin   metFORMIN (GLUCOPHAGE-XR) 500 MG 24 hr tablet TAKE 2 TABLETS 2 X /DAY WITH MEALS FOR DIABETES   minoxidil (LONITEN) 10 MG tablet Take 1 tablet  Daily for BP (Patient not taking: Reported on 08/07/2022)   olmesartan (BENICAR) 40 MG tablet Take  1 tablet  every Night  for BP & Diabetic Kidney Protection   rosuvastatin (CRESTOR) 10 MG tablet Take 1 tablet Daily for Cholesterol   No current facility-administered medications on file prior to visit.     Allergies:  Allergies  Allergen Reactions   Codeine    Ppd [Tuberculin Purified Protein Derivative]     Positive reaction 2001  Lisinopril     Dry cough     Medical History:  Past Medical History:  Diagnosis Date   Hypertension    Hypothyroidism    Splenomegaly 02/14/2019   02/14/2019 abd [place]: Spleen measures 14.1 x 15.6 x 4.7 cm with a measured splenic volume of 542 cubic cm. No focal splenic lesions are evident.   Allergies Allergies  Allergen Reactions   Codeine    Ppd [Tuberculin Purified Protein Derivative]     Positive reaction 2001   Lisinopril     Dry cough    SURGICAL HISTORY She  has a past surgical history that includes Lymph node dissection. FAMILY HISTORY Her family history includes Cancer in her mother; Diabetes in her mother; Heart disease in her father; Hyperlipidemia in her mother; Hypertension in her father and mother. SOCIAL HISTORY She  reports that she has never smoked. She has never used smokeless tobacco. She reports current alcohol use. She reports that she does not use drugs.   Review of Systems:  Review of Systems  Constitutional:  Negative for chills, fever, malaise/fatigue and weight loss.  HENT:  Positive for ear pain (right). Negative  for congestion, hearing loss and tinnitus.   Eyes:  Negative for blurred vision and double vision.  Respiratory:  Negative for cough, shortness of breath and wheezing.   Cardiovascular:  Negative for chest pain, palpitations, orthopnea, claudication and leg swelling.  Gastrointestinal:  Negative for abdominal pain, blood in stool, constipation, diarrhea, heartburn, melena, nausea and vomiting.  Genitourinary: Negative.   Musculoskeletal:  Positive for joint pain (left knee pain). Negative for falls and myalgias.  Skin:  Negative for rash.  Neurological:  Negative for dizziness, tingling, tremors, sensory change, loss of consciousness, weakness and headaches.  Endo/Heme/Allergies:  Negative for polydipsia.  Psychiatric/Behavioral: Negative.  Negative for depression, memory loss and suicidal ideas.   All other systems reviewed and are negative.     Physical Exam: LMP 09/06/2016  Wt Readings from Last 3 Encounters:  08/07/22 146 lb (66.2 kg)  06/20/22 146 lb 6.4 oz (66.4 kg)  04/13/22 146 lb (66.2 kg)   General Appearance: Well nourished, in no apparent distress. Eyes: PERRLA, EOMs, conjunctiva no swelling or erythema Sinuses: No Frontal/maxillary tenderness ENT/Mouth: Ext aud canals clear, Right TM serous exudate. No erythema, swelling, or exudate on post pharynx.  Tonsils not swollen or erythematous. Hearing normal.  Neck: Supple, thyroid normal.  Respiratory: Respiratory effort normal, BS equal bilaterally without rales, rhonchi, wheezing or stridor.  Cardio: RRR with no MRGs. Brisk peripheral pulses without edema.  Abdomen: Soft, + BS.  Non tender, no guarding, rebound, hernias, masses. Lymphatics: Positive right cervical adenopathy Musculoskeletal: Full ROM, 5/5 strength, Normal gait.   Skin: Warm, dry without rashes, lesions, ecchymosis.  Neuro: Cranial nerves intact. No cerebellar symptoms.  Psych: Awake and oriented X 3, normal affect, Insight and Judgment appropriate.     Raynelle Dick, NP 12:17 PM [facility]

## 2022-09-19 ENCOUNTER — Encounter: Payer: Self-pay | Admitting: Nurse Practitioner

## 2022-09-19 ENCOUNTER — Ambulatory Visit: Payer: BC Managed Care – PPO | Admitting: Nurse Practitioner

## 2022-09-19 VITALS — BP 160/82 | HR 67 | Temp 97.7°F | Ht 61.0 in | Wt 148.6 lb

## 2022-09-19 DIAGNOSIS — E039 Hypothyroidism, unspecified: Secondary | ICD-10-CM

## 2022-09-19 DIAGNOSIS — F419 Anxiety disorder, unspecified: Secondary | ICD-10-CM

## 2022-09-19 DIAGNOSIS — Z79899 Other long term (current) drug therapy: Secondary | ICD-10-CM

## 2022-09-19 DIAGNOSIS — E785 Hyperlipidemia, unspecified: Secondary | ICD-10-CM

## 2022-09-19 DIAGNOSIS — E1122 Type 2 diabetes mellitus with diabetic chronic kidney disease: Secondary | ICD-10-CM | POA: Diagnosis not present

## 2022-09-19 DIAGNOSIS — N182 Chronic kidney disease, stage 2 (mild): Secondary | ICD-10-CM

## 2022-09-19 DIAGNOSIS — E1169 Type 2 diabetes mellitus with other specified complication: Secondary | ICD-10-CM

## 2022-09-19 DIAGNOSIS — I10 Essential (primary) hypertension: Secondary | ICD-10-CM

## 2022-09-19 DIAGNOSIS — E663 Overweight: Secondary | ICD-10-CM

## 2022-09-19 DIAGNOSIS — E559 Vitamin D deficiency, unspecified: Secondary | ICD-10-CM

## 2022-09-19 NOTE — Patient Instructions (Signed)
If BP top number is greater than 140 take clonidine 0.1 mg and monitor BP closely . If BP is not responding to the medication notify the office    Managing Your Hypertension Hypertension, also called high blood pressure, is when the force of the blood pressing against the walls of the arteries is too strong. Arteries are blood vessels that carry blood from your heart throughout your body. Hypertension forces the heart to work harder to pump blood and may cause the arteries to become narrow or stiff. Understanding blood pressure readings A blood pressure reading includes a higher number over a lower number: The first, or top, number is called the systolic pressure. It is a measure of the pressure in your arteries as your heart beats. The second, or bottom number, is called the diastolic pressure. It is a measure of the pressure in your arteries as the heart relaxes. For most people, a normal blood pressure is below 120/80. Your personal target blood pressure may vary depending on your medical conditions, your age, and other factors. Blood pressure is classified into four stages. Based on your blood pressure reading, your health care provider may use the following stages to determine what type of treatment you need, if any. Systolic pressure and diastolic pressure are measured in a unit called millimeters of mercury (mmHg). Normal Systolic pressure: below 120. Diastolic pressure: below 80. Elevated Systolic pressure: 120-129. Diastolic pressure: below 80. Hypertension stage 1 Systolic pressure: 130-139. Diastolic pressure: 80-89. Hypertension stage 2 Systolic pressure: 140 or above. Diastolic pressure: 90 or above. How can this condition affect me? Managing your hypertension is very important. Over time, hypertension can damage the arteries and decrease blood flow to parts of the body, including the brain, heart, and kidneys. Having untreated or uncontrolled hypertension can lead to: A heart  attack. A stroke. A weakened blood vessel (aneurysm). Heart failure. Kidney damage. Eye damage. Memory and concentration problems. Vascular dementia. What actions can I take to manage this condition? Hypertension can be managed by making lifestyle changes and possibly by taking medicines. Your health care provider will help you make a plan to bring your blood pressure within a normal range. You may be referred for counseling on a healthy diet and physical activity. Nutrition  Eat a diet that is high in fiber and potassium, and low in salt (sodium), added sugar, and fat. An example eating plan is called the DASH diet. DASH stands for Dietary Approaches to Stop Hypertension. To eat this way: Eat plenty of fresh fruits and vegetables. Try to fill one-half of your plate at each meal with fruits and vegetables. Eat whole grains, such as whole-wheat pasta, brown rice, or whole-grain bread. Fill about one-fourth of your plate with whole grains. Eat low-fat dairy products. Avoid fatty cuts of meat, processed or cured meats, and poultry with skin. Fill about one-fourth of your plate with lean proteins such as fish, chicken without skin, beans, eggs, and tofu. Avoid pre-made and processed foods. These tend to be higher in sodium, added sugar, and fat. Reduce your daily sodium intake. Many people with hypertension should eat less than 1,500 mg of sodium a day. Lifestyle  Work with your health care provider to maintain a healthy body weight or to lose weight. Ask what an ideal weight is for you. Get at least 30 minutes of exercise that causes your heart to beat faster (aerobic exercise) most days of the week. Activities may include walking, swimming, or biking. Include exercise to strengthen your  muscles (resistance exercise), such as weight lifting, as part of your weekly exercise routine. Try to do these types of exercises for 30 minutes at least 3 days a week. Do not use any products that contain  nicotine or tobacco. These products include cigarettes, chewing tobacco, and vaping devices, such as e-cigarettes. If you need help quitting, ask your health care provider. Control any long-term (chronic) conditions you have, such as high cholesterol or diabetes. Identify your sources of stress and find ways to manage stress. This may include meditation, deep breathing, or making time for fun activities. Alcohol use Do not drink alcohol if: Your health care provider tells you not to drink. You are pregnant, may be pregnant, or are planning to become pregnant. If you drink alcohol: Limit how much you have to: 0-1 drink a day for women. 0-2 drinks a day for men. Know how much alcohol is in your drink. In the [place] one drink equals one 12 oz bottle of beer (355 mL), one 5 oz glass of wine (148 mL), or one 1 oz glass of hard liquor (44 mL). Medicines Your health care provider may prescribe medicine if lifestyle changes are not enough to get your blood pressure under control and if: Your systolic blood pressure is 130 or higher. Your diastolic blood pressure is 80 or higher. Take medicines only as told by your health care provider. Follow the directions carefully. Blood pressure medicines must be taken as told by your health care provider. The medicine does not work as well when you skip doses. Skipping doses also puts you at risk for problems. Monitoring Before you monitor your blood pressure: Do not smoke, drink caffeinated beverages, or exercise within 30 minutes before taking a measurement. Use the bathroom and empty your bladder (urinate). Sit quietly for at least 5 minutes before taking measurements. Monitor your blood pressure at home as told by your health care provider. To do this: Sit with your back straight and supported. Place your feet flat on the floor. Do not cross your legs. Support your arm on a flat surface, such as a table. Make sure your upper arm is at heart level. Each  time you measure, take two or three readings one minute apart and record the results. You may also need to have your blood pressure checked regularly by your health care provider. General information Talk with your health care provider about your diet, exercise habits, and other lifestyle factors that may be contributing to hypertension. Review all the medicines you take with your health care provider because there may be side effects or interactions. Keep all follow-up visits. Your health care provider can help you create and adjust your plan for managing your high blood pressure. Where to find more information National Heart, Lung, and Blood Institute: [employer]is American Heart Association: www.heart.org Contact a health care provider if: You think you are having a reaction to medicines you have taken. You have repeated (recurrent) headaches. You feel dizzy. You have swelling in your ankles. You have trouble with your vision. Get help right away if: You develop a severe headache or confusion. You have unusual weakness or numbness, or you feel faint. You have severe pain in your chest or abdomen. You vomit repeatedly. You have trouble breathing. These symptoms may be an emergency. Get help right away. Call 911. Do not wait to see if the symptoms will go away. Do not drive yourself to the hospital. Summary Hypertension is when the force of blood pumping through your  arteries is too strong. If this condition is not controlled, it may put you at risk for serious complications. Your personal target blood pressure may vary depending on your medical conditions, your age, and other factors. For most people, a normal blood pressure is less than 120/80. Hypertension is managed by lifestyle changes, medicines, or both. Lifestyle changes to help manage hypertension include losing weight, eating a healthy, low-sodium diet, exercising more, stopping smoking, and limiting alcohol. This  information is not intended to replace advice given to you by your health care provider. Make sure you discuss any questions you have with your health care provider. Document Revised: 01/13/2021 Document Reviewed: 01/13/2021 Elsevier Patient Education  2023 [employer].

## 2022-09-20 LAB — COMPLETE METABOLIC PANEL WITH GFR
AG Ratio: 1.8 (calc) (ref 1.0–2.5)
ALT: 17 U/L (ref 6–29)
AST: 17 U/L (ref 10–35)
Albumin: 4.7 g/dL (ref 3.6–5.1)
Alkaline phosphatase (APISO): 38 U/L (ref 37–153)
BUN: 14 mg/dL (ref 7–25)
CO2: 27 mmol/L (ref 20–32)
Calcium: 10.2 mg/dL (ref 8.6–10.4)
Chloride: 102 mmol/L (ref 98–110)
Creat: 0.93 mg/dL (ref 0.50–1.03)
Globulin: 2.6 g/dL (calc) (ref 1.9–3.7)
Glucose, Bld: 136 mg/dL — ABNORMAL HIGH (ref 65–99)
Potassium: 4.2 mmol/L (ref 3.5–5.3)
Sodium: 139 mmol/L (ref 135–146)
Total Bilirubin: 0.4 mg/dL (ref 0.2–1.2)
Total Protein: 7.3 g/dL (ref 6.1–8.1)
eGFR: 72 mL/min/{1.73_m2} (ref >=60)

## 2022-09-20 LAB — LIPID PANEL
Cholesterol: 155 mg/dL (ref <200)
HDL: 37 mg/dL — ABNORMAL LOW (ref >=50)
LDL Cholesterol (Calc): 85 mg/dL (calc)
Non-HDL Cholesterol (Calc): 118 mg/dL (calc) (ref <130)
Total CHOL/HDL Ratio: 4.2 (calc) (ref <5.0)
Triglycerides: 253 mg/dL — ABNORMAL HIGH (ref <150)

## 2022-09-20 LAB — CBC WITH DIFFERENTIAL/PLATELET
Absolute Monocytes: 607 cells/uL (ref 200–950)
Basophils Absolute: 33 cells/uL (ref 0–200)
Basophils Relative: 0.4 %
Eosinophils Absolute: 172 cells/uL (ref 15–500)
Eosinophils Relative: 2.1 %
HCT: 38.2 % (ref 35.0–45.0)
Hemoglobin: 12.6 g/dL (ref 11.7–15.5)
Lymphs Abs: 2411 cells/uL (ref 850–3900)
MCH: 27.8 pg (ref 27.0–33.0)
MCHC: 33 g/dL (ref 32.0–36.0)
MCV: 84.3 fL (ref 80.0–100.0)
MPV: 10.2 fL (ref 7.5–12.5)
Monocytes Relative: 7.4 %
Neutro Abs: 4977 cells/uL (ref 1500–7800)
Neutrophils Relative %: 60.7 %
Platelets: 264 10*3/uL (ref 140–400)
RBC: 4.53 10*6/uL (ref 3.80–5.10)
RDW: 13 % (ref 11.0–15.0)
Total Lymphocyte: 29.4 %
WBC: 8.2 10*3/uL (ref 3.8–10.8)

## 2022-09-20 LAB — HEMOGLOBIN A1C
Hgb A1c MFr Bld: 6.8 % of total Hgb — ABNORMAL HIGH (ref <5.7)
Mean Plasma Glucose: 148 mg/dL
eAG (mmol/L): 8.2 mmol/L

## 2022-09-20 LAB — TSH: TSH: 0.65 mIU/L (ref 0.40–4.50)

## 2022-09-25 ENCOUNTER — Other Ambulatory Visit: Payer: Self-pay | Admitting: Internal Medicine

## 2022-09-25 DIAGNOSIS — E1122 Type 2 diabetes mellitus with diabetic chronic kidney disease: Secondary | ICD-10-CM

## 2022-12-11 ENCOUNTER — Encounter: Payer: BC Managed Care – PPO | Admitting: Internal Medicine

## 2022-12-23 ENCOUNTER — Other Ambulatory Visit: Payer: Self-pay | Admitting: Nurse Practitioner

## 2022-12-23 ENCOUNTER — Other Ambulatory Visit: Payer: Self-pay | Admitting: Internal Medicine

## 2022-12-23 DIAGNOSIS — I10 Essential (primary) hypertension: Secondary | ICD-10-CM

## 2022-12-26 ENCOUNTER — Encounter: Payer: Self-pay | Admitting: Internal Medicine

## 2022-12-26 ENCOUNTER — Ambulatory Visit (INDEPENDENT_AMBULATORY_CARE_PROVIDER_SITE_OTHER): Payer: BC Managed Care – PPO | Admitting: Internal Medicine

## 2022-12-26 DIAGNOSIS — Z131 Encounter for screening for diabetes mellitus: Secondary | ICD-10-CM | POA: Diagnosis not present

## 2022-12-26 DIAGNOSIS — R5383 Other fatigue: Secondary | ICD-10-CM

## 2022-12-26 DIAGNOSIS — Z130 Encounter for screening for diseases of the blood and blood-forming organs and certain disorders involving the immune mechanism: Secondary | ICD-10-CM

## 2022-12-26 DIAGNOSIS — Z136 Encounter for screening for cardiovascular disorders: Secondary | ICD-10-CM | POA: Diagnosis not present

## 2022-12-26 DIAGNOSIS — I700 Atherosclerosis of aorta: Secondary | ICD-10-CM

## 2022-12-26 DIAGNOSIS — Z111 Encounter for screening for respiratory tuberculosis: Secondary | ICD-10-CM

## 2022-12-26 DIAGNOSIS — E1122 Type 2 diabetes mellitus with diabetic chronic kidney disease: Secondary | ICD-10-CM

## 2022-12-26 DIAGNOSIS — Z0000 Encounter for general adult medical examination without abnormal findings: Secondary | ICD-10-CM | POA: Diagnosis not present

## 2022-12-26 DIAGNOSIS — E559 Vitamin D deficiency, unspecified: Secondary | ICD-10-CM | POA: Diagnosis not present

## 2022-12-26 DIAGNOSIS — Z1389 Encounter for screening for other disorder: Secondary | ICD-10-CM | POA: Diagnosis not present

## 2022-12-26 DIAGNOSIS — Z13220 Encounter for screening for lipoid disorders: Secondary | ICD-10-CM

## 2022-12-26 DIAGNOSIS — E039 Hypothyroidism, unspecified: Secondary | ICD-10-CM

## 2022-12-26 DIAGNOSIS — Z0001 Encounter for general adult medical examination with abnormal findings: Secondary | ICD-10-CM

## 2022-12-26 DIAGNOSIS — E1169 Type 2 diabetes mellitus with other specified complication: Secondary | ICD-10-CM

## 2022-12-26 DIAGNOSIS — I10 Essential (primary) hypertension: Secondary | ICD-10-CM | POA: Diagnosis not present

## 2022-12-26 DIAGNOSIS — Z8249 Family history of ischemic heart disease and other diseases of the circulatory system: Secondary | ICD-10-CM

## 2022-12-26 DIAGNOSIS — Z79899 Other long term (current) drug therapy: Secondary | ICD-10-CM

## 2022-12-26 DIAGNOSIS — Z1211 Encounter for screening for malignant neoplasm of colon: Secondary | ICD-10-CM

## 2022-12-26 NOTE — Progress Notes (Signed)
Annual Screening/Preventative Visit & Comprehensive Evaluation &  Examination   Future Appointments  Date Time Provider Department  12/26/2022                   cpe  3:00 PM Lucky Cowboy, MD [facility]  04/09/2023  4:00 PM Olivia Mackie, NP [facility]  01/03/2024                    cpe  3:00 PM Lucky Cowboy, MD [facility]        This very nice 57 y.o. MWF presents for a Screening /Preventative Visit & comprehensive evaluation and management of multiple medical co-morbidities.  Patient has been followed for HTN, HLD, T2_NIDDM  and Vitamin D Deficiency.         HTN predates since 2000. Patient's BP has been controlled at home and patient denies any cardiac symptoms as chest pain, palpitations, shortness of breath, dizziness or ankle swelling. Today's BP is elevated at 198/96 & rechecked at 194/90. Patient produces a list of home BPs which are virtually all normal range.         Patient's hyperlipidemia is not controlled with diet and patient has been resistant to take recommended meds for Cholesterol.  Last lipids were at goal  except elevated Trig's :  Lab Results  Component Value Date   CHOL 155 09/19/2022   HDL 37 (L) 09/19/2022   LDLCALC 85 09/19/2022   TRIG 253 (H) 09/19/2022   CHOLHDL 4.2 09/19/2022         Patient has hx/o T2_NIDDM (A1c 6.5% /2019) w/CKD2 (GFR 75) and patient is on Metformin & denies reactive hypoglycemic symptoms, visual blurring, diabetic polys or paresthesias. Last A1c was not at goal  :  Lab Results  Component Value Date   HGBA1C 6.8 (H) 09/19/2022                                              Patient has been on thyroid  replacement  since  dx'd Hypothyroid in 1997.                                                          Finally, patient has history of Vitamin D Deficiency ("20" /2013 & "16" /2014) and last Vitamin D was  still very low :  Lab Results  Component Value Date   VD25OH 34 05/31/2021      Current Outpatient  Medications  Medication Instructions   acetaminophen 500 mg,  Every 6 hours PRN   ALPRAZolam  0.5 MG tablet Take     1/2 to 1 tablet          2 to 3 x /day            as needed for Anxiety   atenolol 100 MG tablet Take  1 tablet  Daily     Cholecalciferol  5,000 Units Daily   fenofibrate  134 MG capsule Take  1 capsule  Daily     hydrochlorothiazide  25 MG tablet TAKE 1 TABLET EVERY MORNING    Levothyroxine  112 MCG tablet Take  1 tablet  Daily     metFORMIN -  XR 500 MG  TAKE 2 TABLETS 2 X /DAY    olmesartan  40 MG tablet Take  1 tablet  every Night     rosuvastatin  10 MG tablet Take 1 tablet Daily     Allergies  Allergen Reactions   Codeine    Ppd [Tuberculin Purified Protein Derivative]     Positive reaction 2001   Lisinopril     Dry cough     Past Medical History:  Diagnosis Date   Hypertension    Hypothyroidism    Splenomegaly 02/14/2019   02/14/2019 abd [place]: Spleen measures 14.1 x 15.6 x 4.7 cm with a measured splenic volume of 542 cubic cm. No focal splenic lesions are evident.     Health Maintenance  Topic Date Due   PNEUMOCOCCAL POLYSACCHARIDE VACCINE AGE 11-64  Never done   Pneumococcal Vaccine 93-23 Years old  Never done   HIV Screening  Never done   Zoster Vaccines- Shingrix (1 of 2) Never done   OPHTHALMOLOGY EXAM  02/16/2019   PAP SMEAR-Modifier  03/28/2019   MAMMOGRAM  05/25/2020   COVID-19 Vaccine (4 - Booster for Pfizer series) 08/09/2020   FOOT EXAM  11/08/2020   INFLUENZA VACCINE  12/13/2020   HEMOGLOBIN A1C  02/11/2021   TETANUS/TDAP  08/01/2022   Fecal DNA (Cologuard)  11/29/2022   Hepatitis C Screening  Completed   HPV VACCINES  Aged Out     Immunization History  Administered Date(s) Administered   Influenza Inj Mdck Quad With Preservative 03/22/2020   Influenza,inj,Quad PF,6+ Mos 04/01/2019   PFIZER(Purple Top)SARS-COV-2 Vaccination 08/23/2019, 09/13/2019, 05/11/2020   Td 06/29/2001, 07/31/2012   Tdap 07/31/2012    Last Colon - Declines  Colonoscopy &  Cologard  11/29/2019 - Negative - Recc 3 year f/u due July 2024  Last MGM - 0/05/22 at GYN office  - Maryelizabeth Rowan , NP   Past Surgical History:  Procedure Laterality Date   LYMPH NODE DISSECTION     NECK- BENIGN    Family History  Problem Relation Age of Onset   Cancer Mother        NON HODGKINS LYMPHOMA   Hypertension Mother    Diabetes Mother    Hyperlipidemia Mother    Hypertension Father    Heart disease Father     Social History   Tobacco Use   Smoking status: Never   Smokeless tobacco: Never  Vaping Use   Vaping Use: Never used  Substance Use Topics   Alcohol use: Yes    Comment: occ.   Drug use: No      ROS Constitutional: Denies fever, chills, weight loss/gain, headaches, insomnia,  night sweats, and change in appetite. Does c/o fatigue. Eyes: Denies redness, blurred vision, diplopia, discharge, itchy, watery eyes.  ENT: Denies discharge, congestion, post nasal drip, epistaxis, sore throat, earache, hearing loss, dental pain, Tinnitus, Vertigo, Sinus pain, snoring.  Cardio: Denies chest pain, palpitations, irregular heartbeat, syncope, dyspnea, diaphoresis, orthopnea, PND, claudication, edema Respiratory: denies cough, dyspnea, DOE, pleurisy, hoarseness, laryngitis, wheezing.  Gastrointestinal: Denies dysphagia, heartburn, reflux, water brash, pain, cramps, nausea, vomiting, bloating, diarrhea, constipation, hematemesis, melena, hematochezia, jaundice, hemorrhoids Genitourinary: Denies dysuria, frequency, urgency, nocturia, hesitancy, discharge, hematuria, flank pain Breast: Breast lumps, nipple discharge, bleeding.  Musculoskeletal: Denies arthralgia, myalgia, stiffness, Jt. Swelling, pain, limp, and strain/sprain. Denies falls. Skin: Denies puritis, rash, hives, warts, acne, eczema, changing in skin lesion Neuro: No weakness, tremor, incoordination, spasms, paresthesia, pain Psychiatric: Denies confusion, memory loss, sensory loss. Denies  Depression.  Endocrine: Denies change in weight, skin, hair change, nocturia, and paresthesia, diabetic polys, visual blurring, hyper / hypo glycemic episodes.  Heme/Lymph: No excessive bleeding, bruising, enlarged lymph nodes.  Physical Exam  BP (!) 198/96   Pulse 64   Temp 97.9 F (36.6 C)   Resp 16   Ht 5' (1.524 m)   Wt 144 lb (65.3 kg)   LMP 09/06/2016   SpO2 97%   BMI 28.12 kg/m   General Appearance: Well nourished, well groomed and in no apparent distress.  Eyes: PERRLA, EOMs, conjunctiva no swelling or erythema, normal fundi and vessels. Sinuses: No frontal/maxillary tenderness ENT/Mouth: EACs patent / TMs  nl. Nares clear without erythema, swelling, mucoid exudates. Oral hygiene is good. No erythema, swelling, or exudate. Tongue normal, non-obstructing. Tonsils not swollen or erythematous. Hearing normal.  Neck: Supple, thyroid not palpable. No bruits, nodes or JVD. Respiratory: Respiratory effort normal.  BS equal and clear bilateral without rales, rhonci, wheezing or stridor. Cardio: Heart sounds are normal with regular rate and rhythm and no murmurs, rubs or gallops. Peripheral pulses are normal and equal bilaterally without edema. No aortic or femoral bruits. Chest: symmetric with normal excursions and percussion. Breasts: Symmetric, without lumps, nipple discharge, retractions, or fibrocystic changes.  Abdomen: Flat, soft with bowel sounds active. Nontender, no guarding, rebound, hernias, masses, or organomegaly.  Lymphatics: Non tender without lymphadenopathy.  Genitourinary:  Musculoskeletal: Full ROM all peripheral extremities, joint stability, 5/5 strength, and normal gait. Skin: Warm and dry without rashes, lesions, cyanosis, clubbing or  ecchymosis.  Neuro: Cranial nerves intact, reflexes equal bilaterally. Normal muscle tone, no cerebellar symptoms. Sensation intact.  Pysch: Alert and oriented X 3, normal affect, Insight and Judgment appropriate.    Assessment  and Plan  1. Annual Preventative Screening Examination   2. Essential hypertension  - EKG 12-Lead - [place], RETROPERITNL ABD,  LTD - Urinalysis, Routine w reflex microscopic - Microalbumin / creatinine urine ratio - CBC with Differential/Platelet - COMPLETE METABOLIC PANEL WITH GFR - Magnesium - TSH   3. Hyperlipidemia associated with type 2 diabetes mellitus (HCC)  - EKG 12-Lead - [place], RETROPERITNL ABD,  LTD - Lipid panel - TSH   4. Type 2 diabetes mellitus with stage 2 chronic kidney  disease, without long-term current use of insulin (HCC)  - EKG 12-Lead - [place], RETROPERITNL ABD,  LTD - HM DIABETES FOOT EXAM - PR LOW EXTEMITY NEUR EXAM DOCUM - Hemoglobin A1c - Insulin, random   5. Vitamin D deficiency  - VITAMIN D 25 Hydroxy    6. Hypothyroidism  - TSH   7. Screening for colorectal cancer  - POC Hemoccult Bld/Stl    8. Screening-pulmonary TB  - QuantiFERON-TB Gold Plus   9. Screening for heart disease  - EKG 12-Lead   10. FHx: heart disease  - EKG 12-Lead - [place], RETROPERITNL ABD,  LTD   11. Screening for AAA (aortic abdominal aneurysm)  - [place], RETROPERITNL ABD,  LTD   12. Fatigue,  - Iron, Total/Total Iron Binding Cap - Vitamin B12   13. Medication management  - Urinalysis, Routine w reflex microscopic - Microalbumin / creatinine urine ratio - CBC with Differential/Platelet - COMPLETE METABOLIC PANEL WITH GFR - Magnesium - Lipid panel - TSH - Hemoglobin A1c - Insulin, random - VITAMIN D 25 Hydroxy         Patient was counseled in prudent diet to achieve/maintain BMI less than 25 for weight control, BP monitoring, regular exercise and medications. Discussed med's effects  and SE's. Screening labs and tests as requested with regular follow-up as recommended. Over 40 minutes of exam, counseling, chart review and high complex critical decision making was performed.   Marinus Maw, MD

## 2022-12-26 NOTE — Patient Instructions (Signed)
Due to recent changes in healthcare laws, you may see the results of your imaging and laboratory studies on MyChart before your provider has had a chance to review them.  We understand that in some cases there may be results that are confusing or concerning to you. Not all laboratory results come back in the same time frame and the provider may be waiting for multiple results in order to interpret others.  Please give [place] 48 hours in order for your provider to thoroughly review all the results before contacting the office for clarification of your results.   ++++++++++++++++++++++++++++++  Vit D  & Vit C 1,000 mg   are recommended to help protect  against the Covid-19 and other Corona viruses.    Also it's recommended  to take  Zinc 50 mg  to help  protect against the Covid-19   and best place to get  is also on [email or web address]  and don't pay more than 6-8 cents /pill !  ================================ Coronavirus (COVID-19) Are you at risk?  Are you at risk for the Coronavirus (COVID-19)?  To be considered HIGH RISK for Coronavirus (COVID-19), you have to meet the following criteria:  Traveled to [place], [place], [place], [place] or [place]; or in the [place] to [place], [place], [place]  or [place]; and have fever, cough, and shortness of breath within the last 2 weeks of travel OR Been in close contact with a person diagnosed with COVID-19 within the last 2 weeks and have  fever, cough,and shortness of breath  IF YOU DO NOT MEET THESE CRITERIA, YOU ARE CONSIDERED LOW RISK FOR COVID-19.  What to do if you are HIGH RISK for COVID-19?  If you are having a medical emergency, call 911. Seek medical care right away. Before you go to a doctor's office, urgent care or emergency department,  call ahead and tell them about your recent travel, contact with someone diagnosed with COVID-19   and your symptoms.  You should receive instructions from your physician's office regarding  next steps of care.  When you arrive at healthcare provider, tell the healthcare staff immediately you have returned from  visiting [place], [place], [place], [place] or [place]; or traveled in the [place] to [place], [place],  [place] or [place] in the last two weeks or you have been in close contact with a person diagnosed with  COVID-19 in the last 2 weeks.   Tell the health care staff about your symptoms: fever, cough and shortness of breath. After you have been seen by a medical provider, you will be either: Tested for (COVID-19) and discharged home on quarantine except to seek medical care if  symptoms worsen, and asked to  Stay home and avoid contact with others until you get your results (4-5 days)  Avoid travel on public transportation if possible (such as bus, train, or airplane) or Sent to the Emergency Department by EMS for evaluation, COVID-19 testing  and  possible admission depending on your condition and test results.  What to do if you are LOW RISK for COVID-19?  Reduce your risk of any infection by using the same precautions used for avoiding the common cold or flu:  Wash your hands often with soap and warm water for at least 20 seconds.  If soap and water are not readily available,  use an alcohol-based hand sanitizer with at least 60% alcohol.  If coughing or sneezing, cover your mouth and nose by coughing  or sneezing into the elbow areas of your shirt or coat,  into a tissue or into your sleeve (not your hands). Avoid shaking hands with others and consider head nods or verbal greetings only. Avoid touching your eyes, nose, or mouth with unwashed hands.  Avoid close contact with people who are sick. Avoid places or events with large numbers of people in one location, like concerts or sporting events. Carefully consider travel plans you have or are making. If you are planning any travel outside or inside the [place], visit the CDC's Travelers' Health webpage for  the latest health notices. If you have some symptoms but not all symptoms, continue to monitor at home and seek medical attention  if your symptoms worsen. If you are having a medical emergency, call 911. >>>>>>>>>>>>>>>>>>>>>>> Preventive Care for Adults  A healthy lifestyle and preventive care can promote health and wellness. Preventive health guidelines for women include the following key practices. A routine yearly physical is a good way to check with your health care provider about your health and preventive screening. It is a chance to share any concerns and updates on your health and to receive a thorough exam. Visit your dentist for a routine exam and preventive care every 6 months. Brush your teeth twice a day and floss once a day. Good oral hygiene prevents tooth decay and gum disease. The frequency of eye exams is based on your age, health, family medical history, use of contact lenses, and other factors. Follow your health care provider's recommendations for frequency of eye exams. Eat a healthy diet. Foods like vegetables, fruits, whole grains, low-fat dairy products, and lean protein foods contain the nutrients you need without too many calories. Decrease your intake of foods high in solid fats, added sugars, and salt. Eat the right amount of calories for you. Get information about a proper diet from your health care provider, if necessary. Regular physical exercise is one of the most important things you can do for your health. Most adults should get at least 150 minutes of moderate-intensity exercise (any activity that increases your heart rate and causes you to sweat) each week. In addition, most adults need muscle-strengthening exercises on 2 or more days a week. Maintain a healthy weight. The body mass index (BMI) is a screening tool to identify possible weight problems. It provides an estimate of body fat based on height and weight. Your health care provider can find your BMI and can  help you achieve or maintain a healthy weight. For adults 20 years and older: A BMI below 18.5 is considered underweight. A BMI of 18.5 to 24.9 is normal. A BMI of 25 to 29.9 is considered overweight. A BMI of 30 and above is considered obese. Maintain normal blood lipids and cholesterol levels by exercising and minimizing your intake of saturated fat. Eat a balanced diet with plenty of fruit and vegetables. Blood tests for lipids and cholesterol should begin at age 43 and be repeated every 5 years. If your lipid or cholesterol levels are high, you are over 50, or you are at high risk for heart disease, you may need your cholesterol levels checked more frequently. Ongoing high lipid and cholesterol levels should be treated with medicines if diet and exercise are not working. If you smoke, find out from your health care provider how to quit. If you do not use tobacco, do not start. Lung cancer screening is recommended for adults aged 11-80 years who are at high risk for developing  lung cancer because of a history of smoking. A yearly low-dose CT scan of the lungs is recommended for people who have at least a 30-pack-year history of smoking and are a current smoker or have quit within the past 15 years. A pack year of smoking is smoking an average of 1 pack of cigarettes a day for 1 year (for example: 1 pack a day for 30 years or 2 packs a day for 15 years). Yearly screening should continue until the smoker has stopped smoking for at least 15 years. Yearly screening should be stopped for people who develop a health problem that would prevent them from having lung cancer treatment. High blood pressure causes heart disease and increases the risk of stroke. Your blood pressure should be checked at least every 1 to 2 years. Ongoing high blood pressure should be treated with medicines if weight loss and exercise do not work. If you are 32-3 years old, ask your health care provider if you should take aspirin to  prevent strokes. Diabetes screening involves taking a blood sample to check your fasting blood sugar level. This should be done once every 3 years, after age 32, if you are within normal weight and without risk factors for diabetes. Testing should be considered at a younger age or be carried out more frequently if you are overweight and have at least 1 risk factor for diabetes. Breast cancer screening is essential preventive care for women. You should practice "breast self-awareness." This means understanding the normal appearance and feel of your breasts and may include breast self-examination. Any changes detected, no matter how small, should be reported to a health care provider. Women in their 45s and 30s should have a clinical breast exam (CBE) by a health care provider as part of a regular health exam every 1 to 3 years. After age 83, women should have a CBE every year. Starting at age 92, women should consider having a mammogram (breast X-ray test) every year. Women who have a family history of breast cancer should talk to their health care provider about genetic screening. Women at a high risk of breast cancer should talk to their health care providers about having an MRI and a mammogram every year. Breast cancer gene (BRCA)-related cancer risk assessment is recommended for women who have family members with BRCA-related cancers. BRCA-related cancers include breast, ovarian, tubal, and peritoneal cancers. Having family members with these cancers may be associated with an increased risk for harmful changes (mutations) in the breast cancer genes BRCA1 and BRCA2. Results of the assessment will determine the need for genetic counseling and BRCA1 and BRCA2 testing. Routine pelvic exams to screen for cancer are no longer recommended for nonpregnant women who are considered low risk for cancer of the pelvic organs (ovaries, uterus, and vagina) and who do not have symptoms. Ask your health care provider if a  screening pelvic exam is right for you. If you have had past treatment for cervical cancer or a condition that could lead to cancer, you need Pap tests and screening for cancer for at least 20 years after your treatment. If Pap tests have been discontinued, your risk factors (such as having a new sexual partner) need to be reassessed to determine if screening should be resumed. Some women have medical problems that increase the chance of getting cervical cancer. In these cases, your health care provider may recommend more frequent screening and Pap tests. Colorectal cancer can be detected and often prevented. Most routine colorectal  cancer screening begins at the age of 10 years and continues through age 19 years. However, your health care provider may recommend screening at an earlier age if you have risk factors for colon cancer. On a yearly basis, your health care provider may provide home test kits to check for hidden blood in the stool. Use of a small camera at the end of a tube, to directly examine the colon (sigmoidoscopy or colonoscopy), can detect the earliest forms of colorectal cancer. Talk to your health care provider about this at age 28, when routine screening begins.  Direct exam of the colon should be repeated every 5-10 years through age 71 years, unless early forms of pre-cancerous polyps or small growths are found. Hepatitis C blood testing is recommended for all people born from [birth year] through 1965 and any individual with known risks for hepatitis C.  Osteoporosis is a disease in which the bones lose minerals and strength with aging. This can result in serious bone fractures or breaks. The risk of osteoporosis can be identified using a bone density scan. Women ages 21 years and over and women at risk for fractures or osteoporosis should discuss screening with their health care providers. Ask your health care provider whether you should take a calcium supplement or vitamin D to reduce the rate  of osteoporosis. Menopause can be associated with physical symptoms and risks. Hormone replacement therapy is available to decrease symptoms and risks. You should talk to your health care provider about whether hormone replacement therapy is right for you. Use sunscreen. Apply sunscreen liberally and repeatedly throughout the day. You should seek shade when your shadow is [place] than you. Protect yourself by wearing long sleeves, pants, a wide-brimmed hat, and sunglasses year round, whenever you are outdoors. Once a month, do a whole body skin exam, using a mirror to look at the skin on your back. Tell your health care provider of new moles, moles that have irregular borders, moles that are larger than a pencil eraser, or moles that have changed in shape or color. Stay current with required vaccines (immunizations). Influenza vaccine. All adults should be immunized every year. Tetanus, diphtheria, and acellular pertussis (Td, Tdap) vaccine. Pregnant women should receive 1 dose of Tdap vaccine during each pregnancy. The dose should be obtained regardless of the length of time since the last dose. Immunization is preferred during the 27th-36th week of gestation. An adult who has not previously received Tdap or who does not know her vaccine status should receive 1 dose of Tdap. This initial dose should be followed by tetanus and diphtheria toxoids (Td) booster doses every 10 years. Adults with an unknown or incomplete history of completing a 3-dose immunization series with Td-containing vaccines should begin or complete a primary immunization series including a Tdap dose. Adults should receive a Td booster every 10 years. Varicella vaccine. An adult without evidence of immunity to varicella should receive 2 doses or a second dose if she has previously received 1 dose. Pregnant females who do not have evidence of immunity should receive the first dose after pregnancy. This first dose should be obtained before  leaving the health care facility. The second dose should be obtained 4-8 weeks after the first dose. Human papillomavirus (HPV) vaccine. Females aged 13-26 years who have not received the vaccine previously should obtain the 3-dose series. The vaccine is not recommended for use in pregnant females. However, pregnancy testing is not needed before receiving a dose. If a female is found to  be pregnant after receiving a dose, no treatment is needed. In that case, the remaining doses should be delayed until after the pregnancy. Immunization is recommended for any person with an immunocompromised condition through the age of 55 years if she did not get any or all doses earlier. During the 3-dose series, the second dose should be obtained 4-8 weeks after the first dose. The third dose should be obtained 24 weeks after the first dose and 16 weeks after the second dose. Zoster vaccine. One dose is recommended for adults aged 15 years or older unless certain conditions are present. Measles, mumps, and rubella (MMR) vaccine. Adults born before 20 generally are considered immune to measles and mumps. Adults born in [birth year] or later should have 1 or more doses of MMR vaccine unless there is a contraindication to the vaccine or there is laboratory evidence of immunity to each of the three diseases. A routine second dose of MMR vaccine should be obtained at least 28 days after the first dose for students attending postsecondary schools, health care workers, or international travelers. People who received inactivated measles vaccine or an unknown type of measles vaccine during 1963-1967 should receive 2 doses of MMR vaccine. People who received inactivated mumps vaccine or an unknown type of mumps vaccine before 1979 and are at high risk for mumps infection should consider immunization with 2 doses of MMR vaccine. For females of childbearing age, rubella immunity should be determined. If there is no evidence of immunity, females  who are not pregnant should be vaccinated. If there is no evidence of immunity, females who are pregnant should delay immunization until after pregnancy. Unvaccinated health care workers born before [birth year] who lack laboratory evidence of measles, mumps, or rubella immunity or laboratory confirmation of disease should consider measles and mumps immunization with 2 doses of MMR vaccine or rubella immunization with 1 dose of MMR vaccine. Pneumococcal 13-valent conjugate (PCV13) vaccine. When indicated, a person who is uncertain of her immunization history and has no record of immunization should receive the PCV13 vaccine. An adult aged 4 years or older who has certain medical conditions and has not been previously immunized should receive 1 dose of PCV13 vaccine. This PCV13 should be followed with a dose of pneumococcal polysaccharide (PPSV23) vaccine. The PPSV23 vaccine dose should be obtained at least 1 or more year(s) after the dose of PCV13 vaccine. An adult aged 15 years or older who has certain medical conditions and previously received 1 or more doses of PPSV23 vaccine should receive 1 dose of PCV13. The PCV13 vaccine dose should be obtained 1 or more years after the last PPSV23 vaccine dose.  Pneumococcal polysaccharide (PPSV23) vaccine. When PCV13 is also indicated, PCV13 should be obtained first. All adults aged 9 years and older should be immunized. An adult younger than age 14 years who has certain medical conditions should be immunized. Any person who resides in a nursing home or long-term care facility should be immunized. An adult smoker should be immunized. People with an immunocompromised condition and certain other conditions should receive both PCV13 and PPSV23 vaccines. People with human immunodeficiency virus (HIV) infection should be immunized as soon as possible after diagnosis. Immunization during chemotherapy or radiation therapy should be avoided. Routine use of PPSV23 vaccine is not  recommended for American Indians, [place] Natives, or people younger than 65 years unless there are medical conditions that require PPSV23 vaccine. When indicated, people who have unknown immunization and have no record of immunization should receive  PPSV23 vaccine. One-time revaccination 5 years after the first dose of PPSV23 is recommended for people aged 19-64 years who have chronic kidney failure, nephrotic syndrome, asplenia, or immunocompromised conditions. People who received 1-2 doses of PPSV23 before age 36 years should receive another dose of PPSV23 vaccine at age 52 years or later if at least 5 years have passed since the previous dose. Doses of PPSV23 are not needed for people immunized with PPSV23 at or after age 34 years.  Preventive Services / Frequency  Ages 74 to 1 years Blood pressure check. Lipid and cholesterol check. Lung cancer screening. / Every year if you are aged 53-80 years and have a 30-pack-year history of smoking and currently smoke or have quit within the past 15 years. Yearly screening is stopped once you have quit smoking for at least 15 years or develop a health problem that would prevent you from having lung cancer treatment. Clinical breast exam.** / Every year after age 16 years.  BRCA-related cancer risk assessment.** / For women who have family members with a BRCA-related cancer (breast, ovarian, tubal, or peritoneal cancers). Mammogram.** / Every year beginning at age 26 years and continuing for as long as you are in good health. Consult with your health care provider. Pap test.** / Every 3 years starting at age 19 years through age 52 or 29 years with a history of 3 consecutive normal Pap tests. HPV screening.** / Every 3 years from ages 18 years through ages 4 to 110 years with a history of 3 consecutive normal Pap tests. Fecal occult blood test (FOBT) of stool. / Every year beginning at age 70 years and continuing until age 53 years. You may not need to do this  test if you get a colonoscopy every 10 years. Flexible sigmoidoscopy or colonoscopy.** / Every 5 years for a flexible sigmoidoscopy or every 10 years for a colonoscopy beginning at age 66 years and continuing until age 44 years. Hepatitis C blood test.** / For all people born from [birth year] through 1965 and any individual with known risks for hepatitis C. Skin self-exam. / Monthly. Influenza vaccine. / Every year. Tetanus, diphtheria, and acellular pertussis (Tdap/Td) vaccine.** / Consult your health care provider. Pregnant women should receive 1 dose of Tdap vaccine during each pregnancy. 1 dose of Td every 10 years. Varicella vaccine.** / Consult your health care provider. Pregnant females who do not have evidence of immunity should receive the first dose after pregnancy. Zoster vaccine.** / 1 dose for adults aged 90 years or older. Pneumococcal 13-valent conjugate (PCV13) vaccine.** / Consult your health care provider. Pneumococcal polysaccharide (PPSV23) vaccine.** / 1 to 2 doses if you smoke cigarettes or if you have certain conditions. Meningococcal vaccine.** / Consult your health care provider. Hepatitis A vaccine.** / Consult your health care provider. Hepatitis B vaccine.** / Consult your health care provider. Screening for abdominal aortic aneurysm (AAA)  by ultrasound is recommended for people over 50 who have history of high blood pressure or who are current or former smokers. ++++++++++++++++++ Recommend Adult Low Dose Aspirin or  coated  Aspirin 81 mg daily  To reduce risk of Colon Cancer 40 %,  Skin Cancer 26 % ,  Melanoma 46%  and  Pancreatic cancer 60% +++++++++++++++++++ Vitamin D goal  is between 70-100.  Please make sure that you are taking your Vitamin D as directed.  It is very important as a natural anti-inflammatory  helping hair, skin, and nails, as well as reducing stroke and heart  attack risk.  It helps your bones and helps with mood. It also decreases numerous  cancer risks so please take it as directed.  Low Vit D is associated with a 200-300% higher risk for CANCER  and 200-300% higher risk for HEART   ATTACK  &  STROKE.   .....................................Marland Kitchen It is also associated with higher death rate at younger ages,  autoimmune diseases like Rheumatoid arthritis, Lupus, Multiple Sclerosis.    Also many other serious conditions, like depression, Alzheimer's Dementia, infertility, muscle aches, fatigue, fibromyalgia - just to name a few. ++++++++++++++++++ Recommend the book "The END of DIETING" by Dr Excell Seltzer  & the book "The END of DIABETES " by Dr Excell Seltzer At [email or web address] - get book & Audio CD's    Being diabetic has a  300% increased risk for heart attack, stroke, cancer, and alzheimer- type vascular dementia. It is very important that you work harder with diet by avoiding all foods that are white. Avoid white rice (brown & wild rice is OK), white potatoes (sweetpotatoes in moderation is OK), White bread or wheat bread or anything made out of white flour like bagels, donuts, rolls, buns, biscuits, cakes, pastries, cookies, pizza crust, and pasta (made from white flour & egg whites) - vegetarian pasta or spinach or wheat pasta is OK. Multigrain breads like Arnold's or [employer], or multigrain sandwich thins or flatbreads.  Diet, exercise and weight loss can reverse and cure diabetes in the early stages.  Diet, exercise and weight loss is very important in the control and prevention of complications of diabetes which affects every system in your body, ie. Brain - dementia/stroke, eyes - glaucoma/blindness, heart - heart attack/heart failure, kidneys - dialysis, stomach - gastric paralysis, intestines - malabsorption, nerves - severe painful neuritis, circulation - gangrene & loss of a leg(s), and finally cancer and Alzheimers.    I recommend avoid fried & greasy foods,  sweets/candy, white rice (brown or wild rice or Quinoa is OK), white  potatoes (sweet potatoes are OK) - anything made from white flour - bagels, doughnuts, rolls, buns, biscuits,white and wheat breads, pizza crust and traditional pasta made of white flour & egg white(vegetarian pasta or spinach or wheat pasta is OK).  Multi-grain bread is OK - like multi-grain flat bread or sandwich thins. Avoid alcohol in excess. Exercise is also important.    Eat all the vegetables you want - avoid meat, especially red meat and dairy - especially cheese.  Cheese is the most concentrated form of trans-fats which is the worst thing to clog up our arteries. Veggie cheese is OK which can be found in the fresh produce section at [employer] or [employer] or [employer]  ++++++++++++++++++++++ DASH Eating Plan  DASH stands for "Dietary Approaches to Stop Hypertension."   The DASH eating plan is a healthy eating plan that has been shown to reduce high blood pressure (hypertension). Additional health benefits may include reducing the risk of type 2 diabetes mellitus, heart disease, and stroke. The DASH eating plan may also help with weight loss. WHAT DO I NEED TO KNOW ABOUT THE DASH EATING PLAN? For the DASH eating plan, you will follow these general guidelines: Choose foods with a percent daily value for sodium of less than 5% (as listed on the food label). Use salt-free seasonings or herbs instead of table salt or sea salt. Check with your health care provider or pharmacist before using salt substitutes. Eat lower-sodium products, often labeled as "lower sodium" or "no  salt added." Eat fresh foods. Eat more vegetables, fruits, and low-fat dairy products. Choose whole grains. Look for the word "whole" as the first word in the ingredient list. Choose fish  Limit sweets, desserts, sugars, and sugary drinks. Choose heart-healthy fats. Eat veggie cheese  Eat more home-cooked food and less restaurant, buffet, and fast food. Limit fried foods. Cook foods using methods other than  frying. Limit canned vegetables. If you do use them, rinse them well to decrease the sodium. When eating at a restaurant, ask that your food be prepared with less salt, or no salt if possible.                      WHAT FOODS CAN I EAT? Read Dr Fara Olden Fuhrman's books on The End of Dieting & The End of Diabetes  Grains Whole grain or whole wheat bread. Brown rice. Whole grain or whole wheat pasta. Quinoa, bulgur, and whole grain cereals. Low-sodium cereals. Corn or whole wheat flour tortillas. Whole grain cornbread. Whole grain crackers. Low-sodium crackers.  Vegetables Fresh or frozen vegetables (raw, steamed, roasted, or grilled). Low-sodium or reduced-sodium tomato and vegetable juices. Low-sodium or reduced-sodium tomato sauce and paste. Low-sodium or reduced-sodium canned vegetables.   Fruits All fresh, canned (in natural juice), or frozen fruits.  Protein Products  All fish and seafood.  Dried beans, peas, or lentils. Unsalted nuts and seeds. Unsalted canned beans.  Dairy Low-fat dairy products, such as skim or 1% milk, 2% or reduced-fat cheeses, low-fat ricotta or cottage cheese, or plain low-fat yogurt. Low-sodium or reduced-sodium cheeses.  Fats and Oils Tub margarines without trans fats. Light or reduced-fat mayonnaise and salad dressings (reduced sodium). Avocado. Safflower, olive, or canola oils. Natural peanut or almond butter.  Other Unsalted popcorn and pretzels. The items listed above may not be a complete list of recommended foods or beverages. Contact your dietitian for more options.  ++++++++++++++++++  WHAT FOODS ARE NOT RECOMMENDED? Grains/ White flour or wheat flour White bread. White pasta. White rice. Refined cornbread. Bagels and croissants. Crackers that contain trans fat.  Vegetables  Creamed or fried vegetables. Vegetables in a . Regular canned vegetables. Regular canned tomato sauce and paste. Regular tomato and vegetable juices.  Fruits Dried fruits.  Canned fruit in light or heavy syrup. Fruit juice.  Meat and Other Protein Products Meat in general - RED meat & White meat.  Fatty cuts of meat. Ribs, chicken wings, all processed meats as bacon, sausage, bologna, salami, fatback, hot dogs, bratwurst and packaged luncheon meats.  Dairy Whole or 2% milk, cream, half-and-half, and cream cheese. Whole-fat or sweetened yogurt. Full-fat cheeses or blue cheese. Non-dairy creamers and whipped toppings. Processed cheese, cheese spreads, or cheese curds.  Condiments Onion and garlic salt, seasoned salt, table salt, and sea salt. Canned and packaged gravies. Worcestershire sauce. Tartar sauce. Barbecue sauce. Teriyaki sauce. Soy sauce, including reduced sodium. Steak sauce. Fish sauce. Oyster sauce. Cocktail sauce. Horseradish. Ketchup and mustard. Meat flavorings and tenderizers. Bouillon cubes. Hot sauce. Tabasco sauce. Marinades. Taco seasonings. Relishes.  Fats and Oils Butter, stick margarine, lard, shortening and bacon fat. Coconut, palm kernel, or palm oils. Regular salad dressings.  Pickles and olives. Salted popcorn and pretzels.  The items listed above may not be a complete list of foods and beverages to avoid.

## 2022-12-26 NOTE — Progress Notes (Incomplete)
Annual Screening/Preventative Visit & Comprehensive Evaluation &  Examination   Future Appointments  Date Time Provider Department  12/26/2022                   cpe  3:00 PM Lucky Cowboy, MD [facility]  04/09/2023  4:00 PM Olivia Mackie, NP [facility]  01/03/2024                    cpe  3:00 PM Lucky Cowboy, MD [facility]        This very nice 57 y.o. MWF presents for a Screening /Preventative Visit & comprehensive evaluation and management of multiple medical co-morbidities.  Patient has been followed for HTN, HLD, T2_NIDDM  and Vitamin D Deficiency.         HTN predates since 2000. Patient's BP has been controlled at home and patient denies any cardiac symptoms as chest pain, palpitations, shortness of breath, dizziness or ankle swelling. Today's BP is at goal -                         .        Patient's hyperlipidemia is not controlled with diet and patient has been resistant to take recommended meds for Cholesterol.  Last lipids were at goal  except elevated Trig's :  Lab Results  Component Value Date   CHOL 155 09/19/2022   HDL 37 (L) 09/19/2022   LDLCALC 85 09/19/2022   TRIG 253 (H) 09/19/2022   CHOLHDL 4.2 09/19/2022         Patient has hx/o T2_NIDDM (A1c 6.5% /2019) w/CKD2 (GFR 75) and patient is on Metformin & denies reactive hypoglycemic symptoms, visual blurring, diabetic polys or paresthesias. Last A1c was not at goal  :  Lab Results  Component Value Date   HGBA1C 6.8 (H) 09/19/2022                                              Patient has been on thyroid  replacement  since  dx'd Hypothyroid in 1997.                                                          Finally, patient has history of Vitamin D Deficiency ("20" /2013 & "16" /2014) and last Vitamin D was  still very low :  Lab Results  Component Value Date   VD25OH 34 05/31/2021      Current Outpatient Medications  Medication Instructions  . acetaminophen 500 mg,  Every 6 hours PRN  .  ALPRAZolam  0.5 MG tablet Take     1/2 to 1 tablet          2 to 3 x /day            as needed for Anxiety  . atenolol 100 MG tablet Take  1 tablet  Daily    . Cholecalciferol  5,000 Units Daily  . fenofibrate  134 MG capsule Take  1 capsule  Daily    . hydrochlorothiazide  25 MG tablet TAKE 1 TABLET EVERY MORNING   . Levothyroxine  112 MCG tablet Take  1 tablet  Daily    . metFORMIN -XR 500 MG  TAKE 2 TABLETS 2 X /DAY   . olmesartan  40 MG tablet Take  1 tablet  every Night    . rosuvastatin  10 MG tablet Take 1 tablet Daily     Allergies  Allergen Reactions  . Codeine   . Ppd [Tuberculin Purified Protein Derivative]     Positive reaction 2001  . Lisinopril     Dry cough     Past Medical History:  Diagnosis Date  . Hypertension   . Hypothyroidism   . Splenomegaly 02/14/2019   02/14/2019 abd [place]: Spleen measures 14.1 x 15.6 x 4.7 cm with a measured splenic volume of 542 cubic cm. No focal splenic lesions are evident.     Health Maintenance  Topic Date Due  . PNEUMOCOCCAL POLYSACCHARIDE VACCINE AGE 58-64  Never done  . Pneumococcal Vaccine 27-41 Years old  Never done  . HIV Screening  Never done  . Zoster Vaccines- Shingrix (1 of 2) Never done  . OPHTHALMOLOGY EXAM  02/16/2019  . PAP SMEAR-Modifier  03/28/2019  . MAMMOGRAM  05/25/2020  . COVID-19 Vaccine (4 - Booster for Pfizer series) 08/09/2020  . FOOT EXAM  11/08/2020  . INFLUENZA VACCINE  12/13/2020  . HEMOGLOBIN A1C  02/11/2021  . TETANUS/TDAP  08/01/2022  . Fecal DNA (Cologuard)  11/29/2022  . Hepatitis C Screening  Completed  . HPV VACCINES  Aged Out     Immunization History  Administered Date(s) Administered  . Influenza Inj Mdck Quad With Preservative 03/22/2020  . Influenza,inj,Quad PF,6+ Mos 04/01/2019  . PFIZER(Purple Top)SARS-COV-2 Vaccination 08/23/2019, 09/13/2019, 05/11/2020  . Td 06/29/2001, 07/31/2012  . Tdap 07/31/2012    Last Colon - Declines Colonoscopy &  Cologard  11/29/2019 - Negative -  Recc 3 year f/u due July 2024  Last MGM - 0/05/22 at GYN office  - Maryelizabeth Rowan , NP   Past Surgical History:  Procedure Laterality Date  . LYMPH NODE DISSECTION     NECK- BENIGN    Family History  Problem Relation Age of Onset  . Cancer Mother        NON HODGKINS LYMPHOMA  . Hypertension Mother   . Diabetes Mother   . Hyperlipidemia Mother   . Hypertension Father   . Heart disease Father     Social History   Tobacco Use  . Smoking status: Never  . Smokeless tobacco: Never  Vaping Use  . Vaping Use: Never used  Substance Use Topics  . Alcohol use: Yes    Comment: occ.  . Drug use: No      ROS Constitutional: Denies fever, chills, weight loss/gain, headaches, insomnia,  night sweats, and change in appetite. Does c/o fatigue. Eyes: Denies redness, blurred vision, diplopia, discharge, itchy, watery eyes.  ENT: Denies discharge, congestion, post nasal drip, epistaxis, sore throat, earache, hearing loss, dental pain, Tinnitus, Vertigo, Sinus pain, snoring.  Cardio: Denies chest pain, palpitations, irregular heartbeat, syncope, dyspnea, diaphoresis, orthopnea, PND, claudication, edema Respiratory: denies cough, dyspnea, DOE, pleurisy, hoarseness, laryngitis, wheezing.  Gastrointestinal: Denies dysphagia, heartburn, reflux, water brash, pain, cramps, nausea, vomiting, bloating, diarrhea, constipation, hematemesis, melena, hematochezia, jaundice, hemorrhoids Genitourinary: Denies dysuria, frequency, urgency, nocturia, hesitancy, discharge, hematuria, flank pain Breast: Breast lumps, nipple discharge, bleeding.  Musculoskeletal: Denies arthralgia, myalgia, stiffness, Jt. Swelling, pain, limp, and strain/sprain. Denies falls. Skin: Denies puritis, rash, hives, warts, acne, eczema, changing in skin lesion Neuro: No weakness, tremor, incoordination, spasms, paresthesia, pain Psychiatric: Denies confusion, memory  loss, sensory loss. Denies Depression. Endocrine: Denies change in  weight, skin, hair change, nocturia, and paresthesia, diabetic polys, visual blurring, hyper / hypo glycemic episodes.  Heme/Lymph: No excessive bleeding, bruising, enlarged lymph nodes.  Physical Exam  LMP 09/06/2016   General Appearance: Well nourished, well groomed and in no apparent distress.  Eyes: PERRLA, EOMs, conjunctiva no swelling or erythema, normal fundi and vessels. Sinuses: No frontal/maxillary tenderness ENT/Mouth: EACs patent / TMs  nl. Nares clear without erythema, swelling, mucoid exudates. Oral hygiene is good. No erythema, swelling, or exudate. Tongue normal, non-obstructing. Tonsils not swollen or erythematous. Hearing normal.  Neck: Supple, thyroid not palpable. No bruits, nodes or JVD. Respiratory: Respiratory effort normal.  BS equal and clear bilateral without rales, rhonci, wheezing or stridor. Cardio: Heart sounds are normal with regular rate and rhythm and no murmurs, rubs or gallops. Peripheral pulses are normal and equal bilaterally without edema. No aortic or femoral bruits. Chest: symmetric with normal excursions and percussion. Breasts: Symmetric, without lumps, nipple discharge, retractions, or fibrocystic changes.  Abdomen: Flat, soft with bowel sounds active. Nontender, no guarding, rebound, hernias, masses, or organomegaly.  Lymphatics: Non tender without lymphadenopathy.  Genitourinary:  Musculoskeletal: Full ROM all peripheral extremities, joint stability, 5/5 strength, and normal gait. Skin: Warm and dry without rashes, lesions, cyanosis, clubbing or  ecchymosis.  Neuro: Cranial nerves intact, reflexes equal bilaterally. Normal muscle tone, no cerebellar symptoms. Sensation intact.  Pysch: Alert and oriented X 3, normal affect, Insight and Judgment appropriate.    Assessment and Plan  1. Annual Preventative Screening Examination   2. Essential hypertension  - EKG 12-Lead - [place], RETROPERITNL ABD,  LTD - Urinalysis, Routine w reflex  microscopic - Microalbumin / creatinine urine ratio - CBC with Differential/Platelet - COMPLETE METABOLIC PANEL WITH GFR - Magnesium - TSH  3. Hyperlipidemia associated with type 2 diabetes mellitus (HCC)  - EKG 12-Lead - [place], RETROPERITNL ABD,  LTD - Lipid panel - TSH  4. Type 2 diabetes mellitus with stage 2 chronic kidney  disease, without long-term current use of insulin (HCC)  - EKG 12-Lead - [place], RETROPERITNL ABD,  LTD - HM DIABETES FOOT EXAM - PR LOW EXTEMITY NEUR EXAM DOCUM - Hemoglobin A1c - Insulin, random  5. Vitamin D deficiency  - VITAMIN D 25 Hydroxy   6. Hypothyroidism - TSH  7. Screening for colorectal cancer  - POC Hemoccult Bld/Stl   8. Screening-pulmonary TB  - QuantiFERON-TB Gold Plus  9. Screening for heart disease  - EKG 12-Lead  10. FHx: heart disease  - EKG 12-Lead - [place], RETROPERITNL ABD,  LTD  11. Screening for AAA (aortic abdominal aneurysm)  - [place], RETROPERITNL ABD,  LTD  12. Fatigue, unspecified type  - Iron, Total/Total Iron Binding Cap - Vitamin B12  13. Medication management  - Urinalysis, Routine w reflex microscopic - Microalbumin / creatinine urine ratio - CBC with Differential/Platelet - COMPLETE METABOLIC PANEL WITH GFR - Magnesium - Lipid panel - TSH - Hemoglobin A1c - Insulin, random - VITAMIN D 25 Hydroxy         Patient was counseled in prudent diet to achieve/maintain BMI less than 25 for weight control, BP monitoring, regular exercise and medications. Discussed med's effects and SE's. Screening labs and tests as requested with regular follow-up as recommended. Over 40 minutes of exam, counseling, chart review and high complex critical decision making was performed.   Marinus Maw, MD

## 2022-12-28 NOTE — Progress Notes (Signed)
QuantiferonTB gold test is NEGATIVE - Great  ! - No sign of TB infection

## 2023-01-01 ENCOUNTER — Encounter: Payer: Self-pay | Admitting: Internal Medicine

## 2023-01-04 ENCOUNTER — Telehealth: Payer: Self-pay | Admitting: Nurse Practitioner

## 2023-01-04 ENCOUNTER — Encounter: Payer: Self-pay | Admitting: Nurse Practitioner

## 2023-01-04 ENCOUNTER — Other Ambulatory Visit: Payer: Self-pay

## 2023-01-04 ENCOUNTER — Ambulatory Visit: Payer: BC Managed Care – PPO | Admitting: Nurse Practitioner

## 2023-01-04 VITALS — HR 68 | Temp 97.9°F | Ht 61.0 in

## 2023-01-04 DIAGNOSIS — U071 COVID-19: Secondary | ICD-10-CM

## 2023-01-04 DIAGNOSIS — Z1152 Encounter for screening for COVID-19: Secondary | ICD-10-CM

## 2023-01-04 LAB — POC COVID19 BINAXNOW: SARS Coronavirus 2 Ag: POSITIVE — AB

## 2023-01-04 MED ORDER — DEXAMETHASONE 1 MG PO TABS: ORAL_TABLET | ORAL | 0 refills | Status: DC

## 2023-01-04 NOTE — Progress Notes (Signed)
THIS ENCOUNTER IS A VIRTUAL VISIT DUE TO COVID-19 - PATIENT WAS NOT SEEN IN THE OFFICE.  PATIENT HAS CONSENTED TO VIRTUAL VISIT / TELEMEDICINE VISIT   Virtual Visit via telephone Note  I connected with  Allison Contreras on 01/04/2023 by telephone.  I verified that I am speaking with the correct person using two identifiers.    I discussed the limitations of evaluation and management by telemedicine and the availability of in person appointments. The patient expressed understanding and agreed to proceed.  History of Present Illness:  Pulse 68   Temp 97.9 F (36.6 C)   Ht 5\' 1"  (1.549 m)   LMP 09/06/2016   SpO2 97%   BMI 27.21 kg/m  57 y.o. patient contacted office reporting URI sx headache, congestion and sore throat that began yesterday. she tested positive by home covid test today. OV was conducted by telephone to minimize exposure. This patient was vaccinated for covid 19, last 12/03/20   Sx began 1 day ago with headache, congestion , sore throat.  Denies nausea, vomiting, fever, chills and body aches.   Treatments tried so far: tylenol  Exposures: husband   Medications  Current Outpatient Medications (Endocrine & Metabolic):    levothyroxine (SYNTHROID) 112 MCG tablet, Take  1 tablet  Daily  on an empty stomach with only water for 30 minutes & no Antacid meds, Calcium or Magnesium for 4 hours & avoid Biotin   metFORMIN (GLUCOPHAGE-XR) 500 MG 24 hr tablet, TAKE 2 TABLETS 2 X /DAY WITH MEALS FOR DIABETES  Current Outpatient Medications (Cardiovascular):    atenolol (TENORMIN) 100 MG tablet, Take  1 tablet  Daily  for BP                                                                                 /                                                                   TAKE                                         BY                                                 MOUTH   fenofibrate micronized (LOFIBRA) 134 MG capsule, Take  1 capsule  Daily  for Triglycerides  ( Blood Fats )    hydrochlorothiazide (HYDRODIURIL) 25 MG tablet, TAKE 1 TABLET BY MOUTH EVERY MORNING FOR BLOOD PRESSURE AND FLUID RETENTION   olmesartan (BENICAR) 40 MG tablet, Take  1 tablet  every Night  for BP & Diabetic Kidney Protection   rosuvastatin (CRESTOR) 10 MG tablet, Take 1 tablet Daily for  Cholesterol   Current Outpatient Medications (Analgesics):    acetaminophen (TYLENOL) 500 MG tablet, Take 500 mg by mouth every 6 (six) hours as needed.   Current Outpatient Medications (Other):    Cholecalciferol 125 MCG (5000 UT) capsule, Take 5,000 Units by mouth daily.   glucose blood (ONETOUCH VERIO) test strip, Check blood sugar Daily  ( Dx:  e.11 )   Lancets (ONETOUCH DELICA PLUS LANCET30G) MISC, USE TO TEST BLOOD SUGAR ONCE DAILY   ALPRAZolam (XANAX) 0.5 MG tablet, Take     1/2 to 1 tablet          2 to 3 x /day            as needed for Anxiety (Patient not taking: Reported on 01/04/2023)  Allergies:  Allergies  Allergen Reactions   Codeine    Ppd [Tuberculin Purified Protein Derivative]     Positive reaction 2001   Lisinopril     Dry cough    Problem list She has Essential hypertension; Hypothyroidism; Hyperlipidemia associated with type 2 diabetes mellitus (HCC); Irregular periods/menstrual cycles; Vitamin D deficiency; Medication management; BMI 28.0-28.9,adult; FHx: heart disease; Type 2 diabetes mellitus with stage 2 chronic kidney disease, without long-term current use of insulin (HCC); and Hepatic steatosis on their problem list.   Social History:   reports that she has never smoked. She has never used smokeless tobacco. She reports current alcohol use. She reports that she does not use drugs.  Observations/Objective:  General : Well sounding patient in no apparent distress HEENT: no hoarseness, no cough for duration of visit Lungs: speaks in complete sentences, no audible wheezing, no apparent distress Neurological: alert, oriented x 3 Psychiatric: pleasant, judgement appropriate    Assessment and Plan:  Covid 19 Covid 19 positive per rapid screening test at home Risk factors include: hypertension, overweight, diabetes Symptoms are: mild Immue support reviewed Vit C, Vit D, and Zinc Take tylenol PRN temp 101+ Push hydration Regular ambulation or calf exercises exercises for clot prevention and 81 mg ASA unless contraindicated Sx supportive therapy suggested Follow up via mychart or telephone if needed Advised patient obtain O2 monitor; present to ED if persistently <90% or with severe dyspnea, CP, fever uncontrolled by tylenol, confusion, sudden decline       Should remain in isolation 5 days from testing positive and then wear a mask when around other people for the following 5 days  Encounter for screening for COVID-19 -     POC COVID-19  COVID -     dexamethasone (DECADRON) 1 MG tablet; Take 3 tabs for 3 days, 2 tabs for 3 days 1 tab for 5 days. Take with food.    Follow Up Instructions:  I discussed the assessment and treatment plan with the patient. The patient was provided an opportunity to ask questions and all were answered. The patient agreed with the plan and demonstrated an understanding of the instructions.   The patient was advised to call back or seek an in-person evaluation if the symptoms worsen or if the condition fails to improve as anticipated.  I provided 15 minutes of non-face-to-face time during this encounter.   Raynelle Dick, NP

## 2023-01-04 NOTE — Patient Instructions (Addendum)
COVID -     dexamethasone (DECADRON) 1 MG tablet; Take 3 tabs for 3 days, 2 tabs for 3 days 1 tab for 5 days. Take with food. Immue support reviewed Vit C, Vit D, and Zinc Take tylenol PRN temp 101+ Push hydration Regular ambulation or calf exercises exercises for clot prevention and 81 mg ASA unless contraindicated Sx supportive therapy suggested Follow up via mychart or telephone if needed Advised patient obtain O2 monitor; present to ED if persistently <90% or with severe dyspnea, CP, fever uncontrolled by tylenol, confusion, sudden decline       Should remain in isolation 5 days from testing positive and then wear a mask when around other people for the following 5 days

## 2023-01-04 NOTE — Telephone Encounter (Signed)
I have done a very low dose of steroid. It can elevate blood sugars but should not raise BP.  If she feels like she is doing ok without it she does not have to take the medication

## 2023-01-04 NOTE — Telephone Encounter (Signed)
Spoke with patient and informed her what Annabelle Harman said and patient understands.

## 2023-01-04 NOTE — Telephone Encounter (Signed)
Pt tested positive for Covid. Yall discussed having her start a steroid. She is concerned that the steroid will increase her BP and sugar levels.

## 2023-01-19 IMAGING — MG MM DIGITAL SCREENING BILAT W/ TOMO AND CAD
8 series · 9 of 24 positions shown · non-contrast
Comparison: Previous exam(s).

CLINICAL DATA: Screening.

EXAM:
DIGITAL SCREENING BILATERAL MAMMOGRAM WITH TOMOSYNTHESIS AND CAD
TECHNIQUE: Bilateral screening digital craniocaudal and mediolateral oblique
mammograms were obtained. Bilateral screening digital breast
tomosynthesis was performed. The images were evaluated with
computer-aided detection.

[R MLO synth-2D]
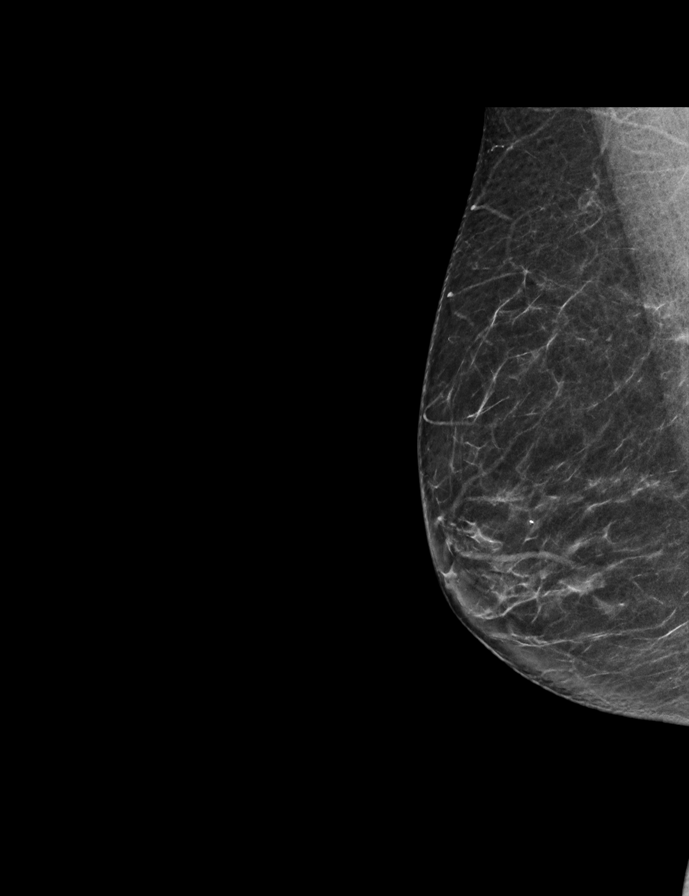

[L MLO synth-2D]
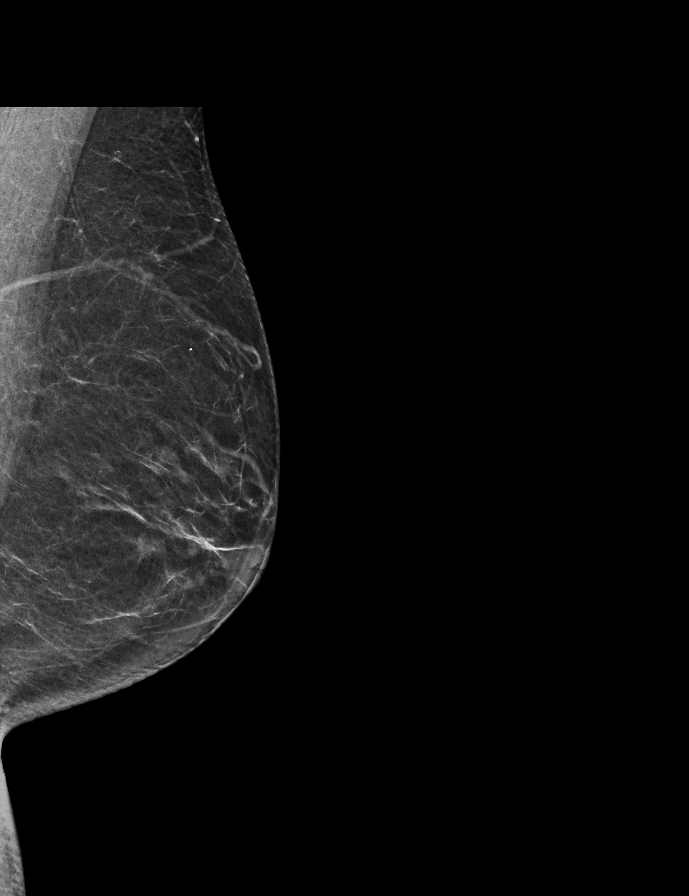

[R CC synth-2D]
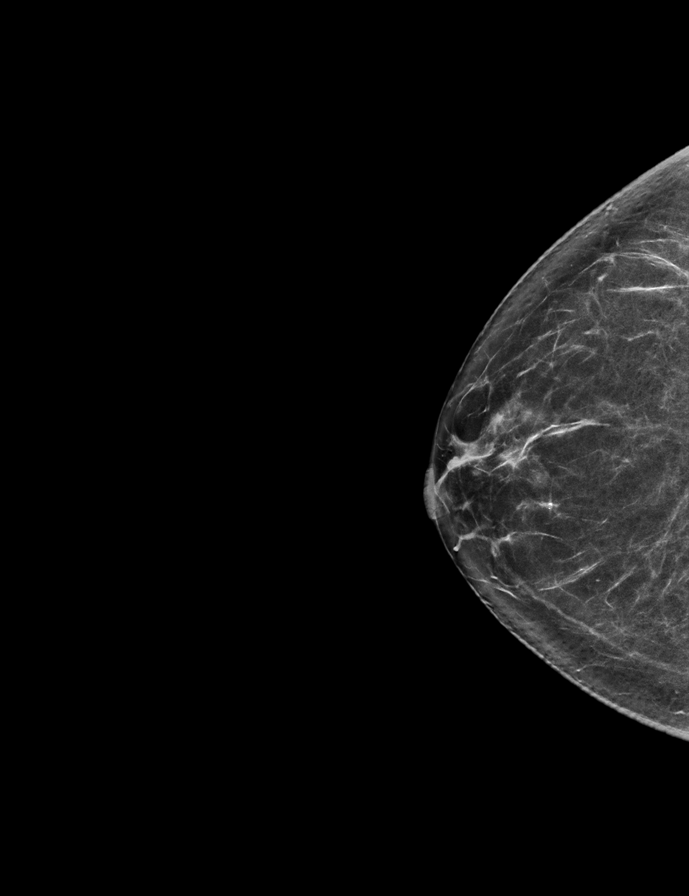

[L CC synth-2D]
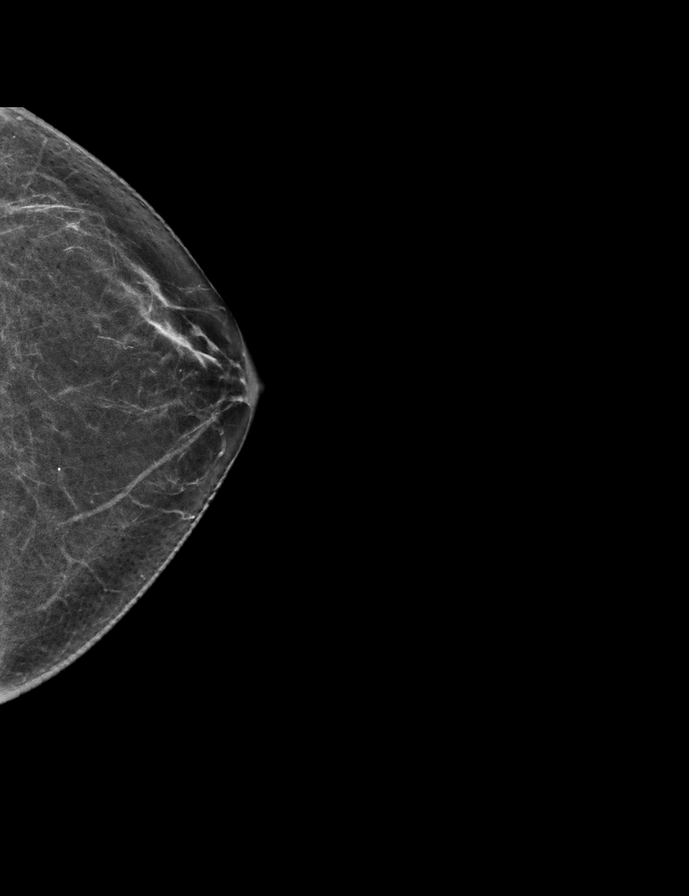

[R CC tomo · 2 of 60 frames shown]
[frame 20/60]
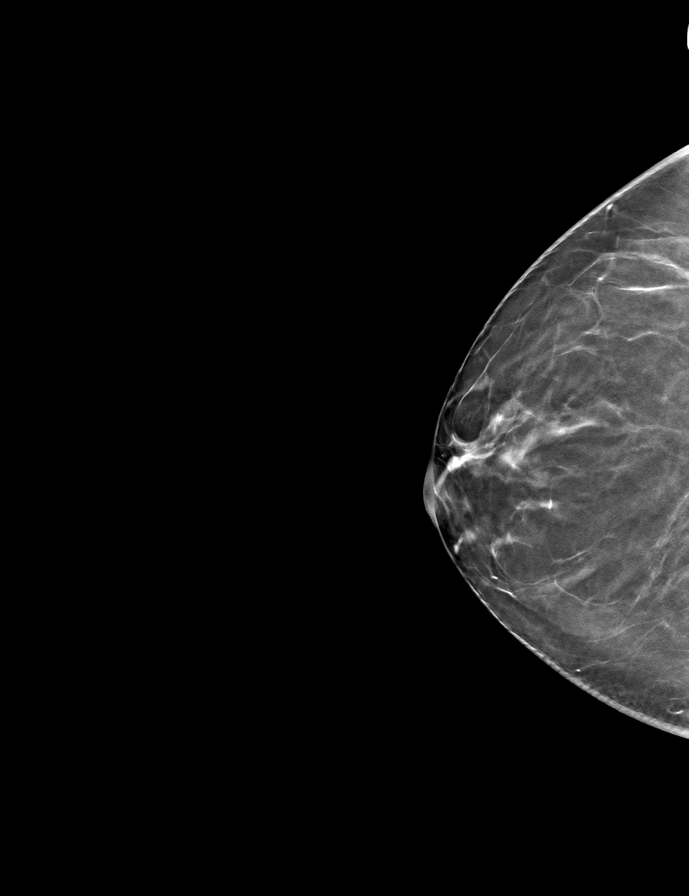
[frame 31/60]
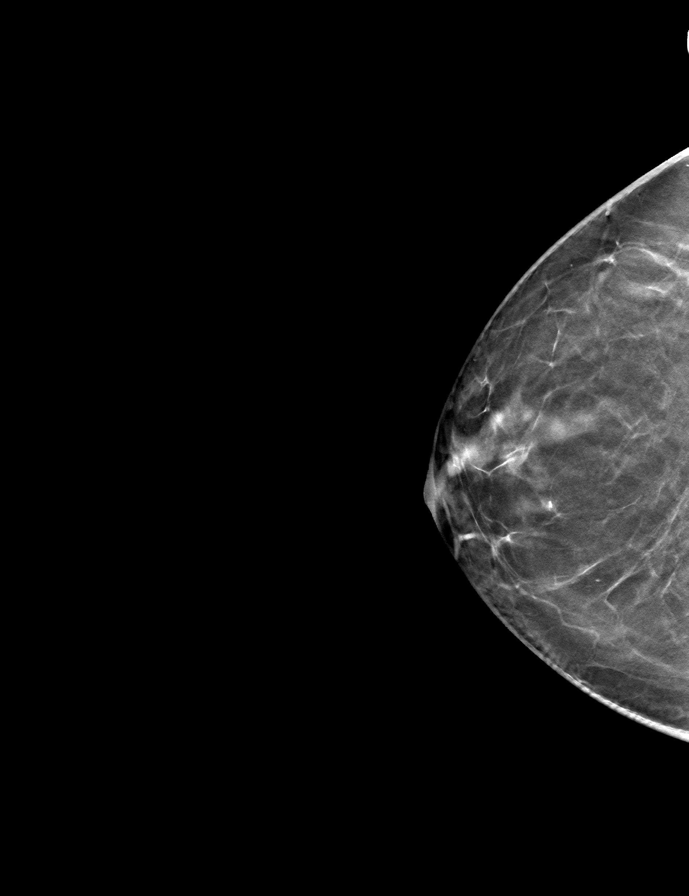

[R MLO tomo · tomo slice 30/59.0]
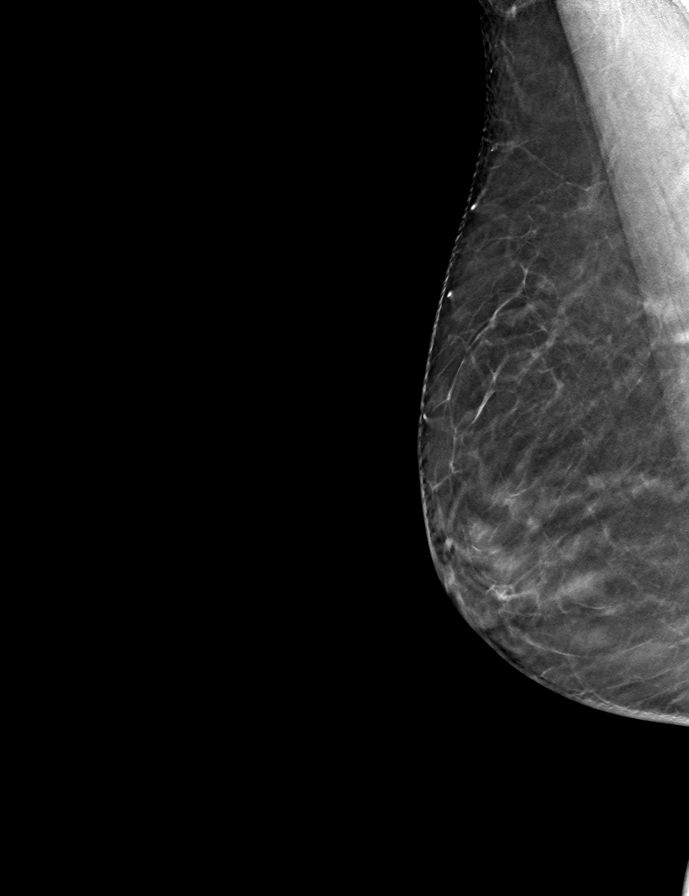

[L MLO tomo · tomo slice 29/57.0]
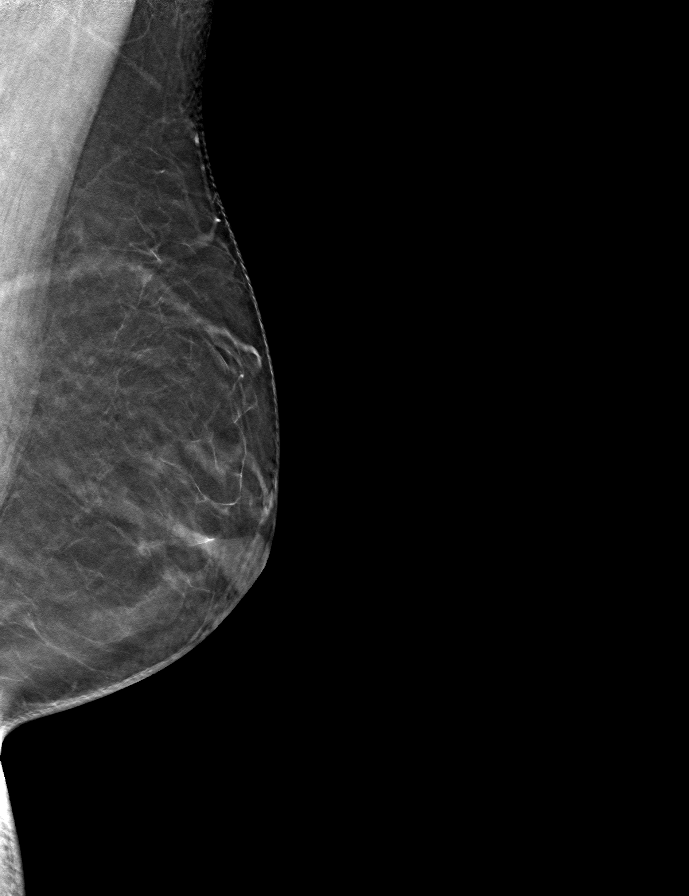

[L CC tomo · tomo slice 29/57.0]
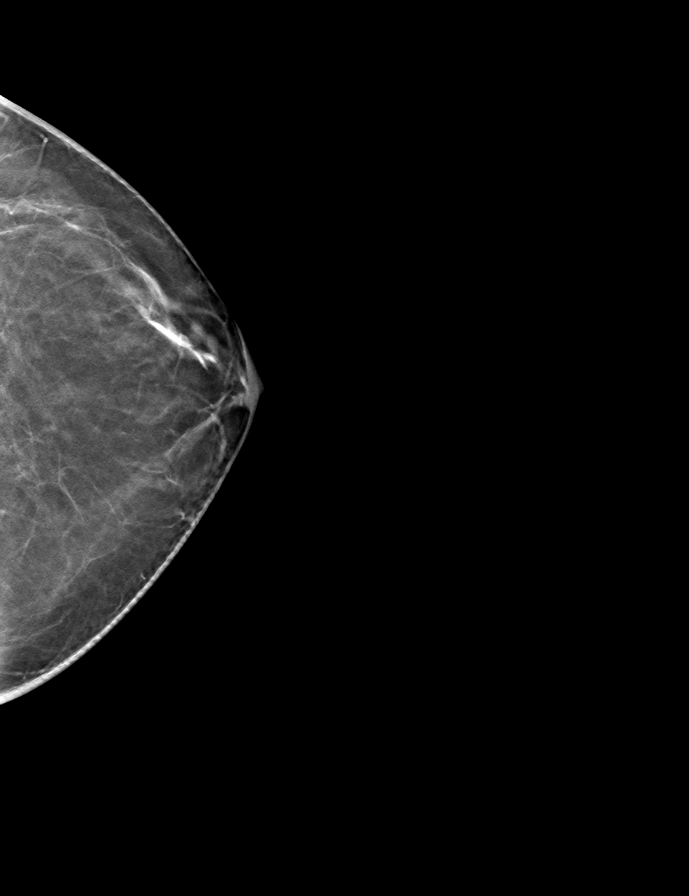

[9 of 24 positions shown; findings below may reference images not displayed]

ACR Breast Density Category b: There are scattered areas of
fibroglandular density.
FINDINGS: There are no findings suspicious for malignancy.
IMPRESSION: No mammographic evidence of malignancy. A result letter of this
screening mammogram will be mailed directly to the patient.

RECOMMENDATION:
Screening mammogram in one year. (Code:51-[id number])

BI-RADS CATEGORY  1: Negative.

## 2023-01-29 ENCOUNTER — Other Ambulatory Visit: Payer: Self-pay | Admitting: Nurse Practitioner

## 2023-01-29 DIAGNOSIS — Z1231 Encounter for screening mammogram for malignant neoplasm of breast: Secondary | ICD-10-CM

## 2023-01-30 ENCOUNTER — Other Ambulatory Visit: Payer: Self-pay | Admitting: Internal Medicine

## 2023-01-30 DIAGNOSIS — E782 Mixed hyperlipidemia: Secondary | ICD-10-CM

## 2023-01-30 DIAGNOSIS — I10 Essential (primary) hypertension: Secondary | ICD-10-CM

## 2023-01-30 DIAGNOSIS — E1165 Type 2 diabetes mellitus with hyperglycemia: Secondary | ICD-10-CM

## 2023-02-18 IMAGING — CR DG KNEE COMPLETE 4+V*L*
4 series · 4 of 4 positions shown · non-contrast
Comparison: None.

CLINICAL DATA: Left knee pain.  No known injury.

EXAM:
LEFT KNEE - COMPLETE 4+ VIEW

[w knee ap left]
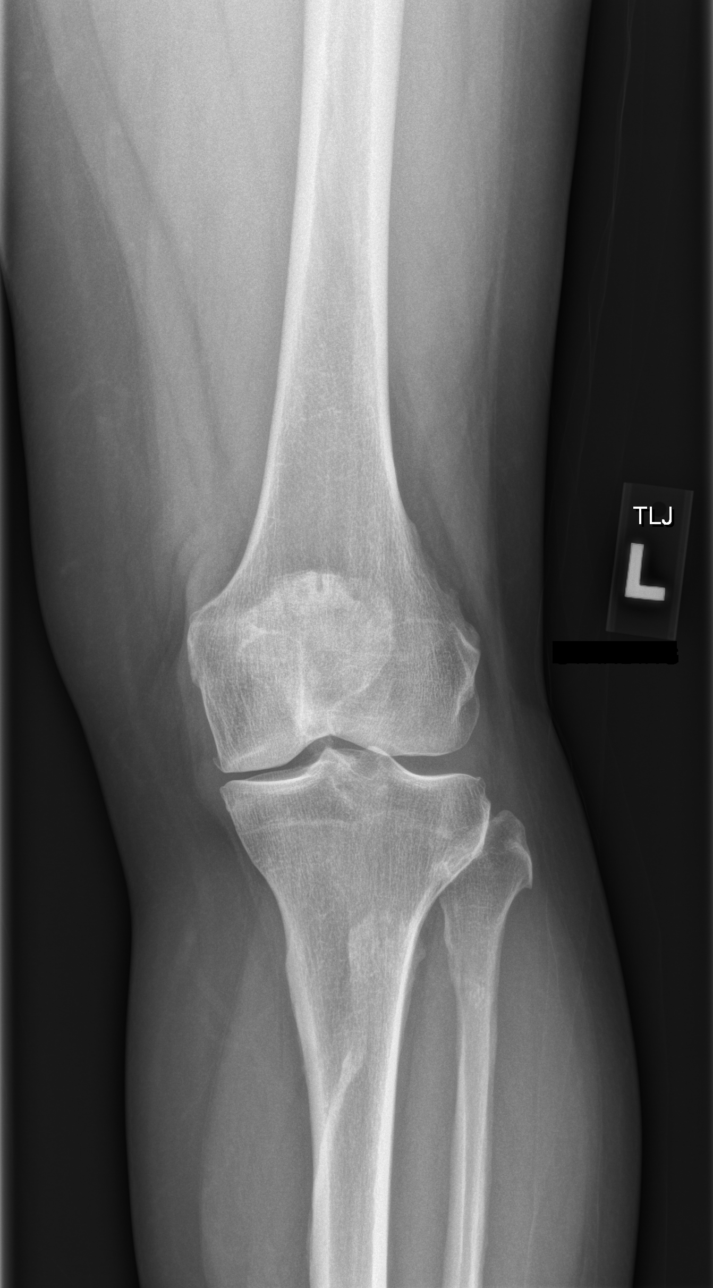

[w knee lat left]
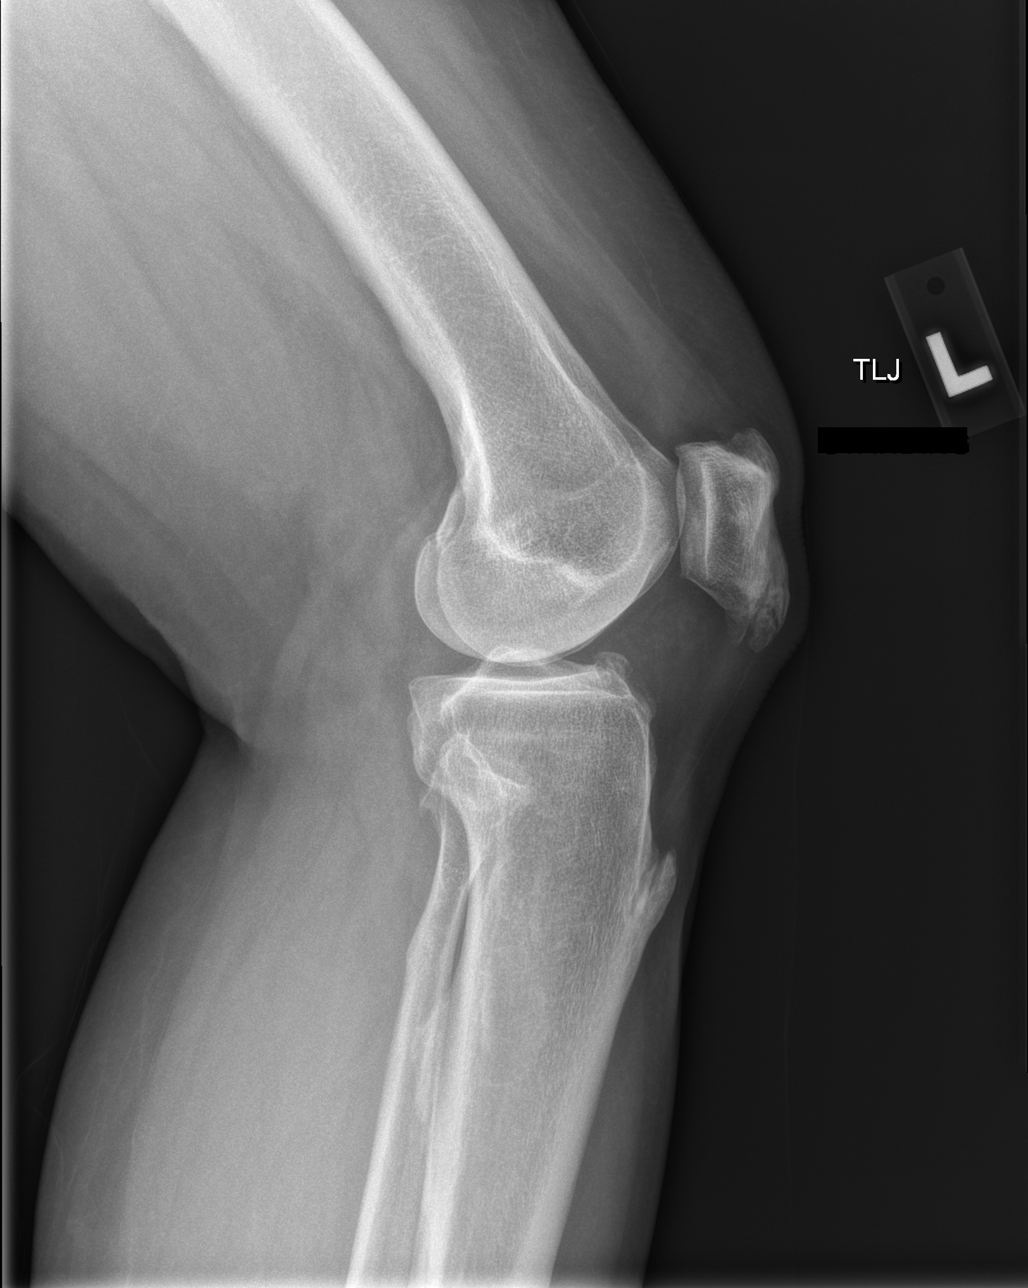

[w knee tunnel pa left]
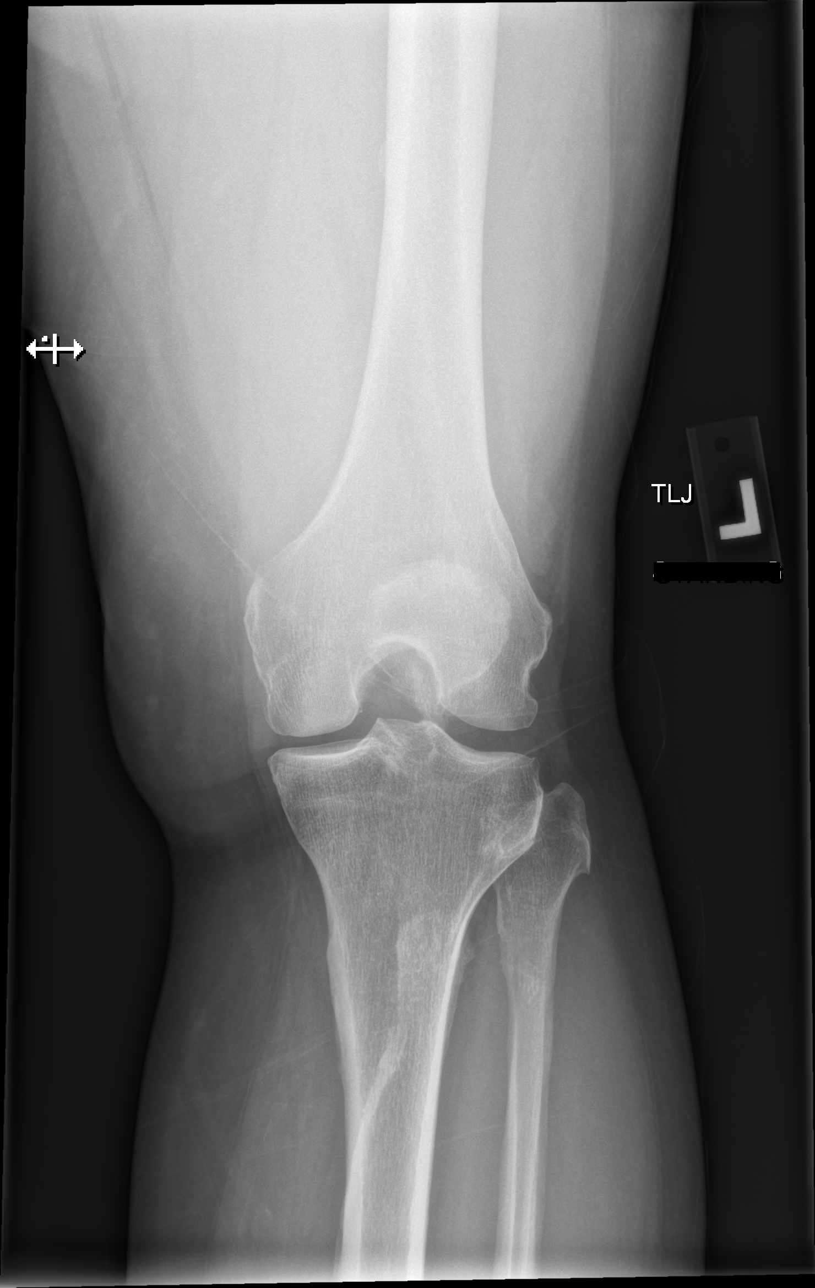

[x knee sunrise left]
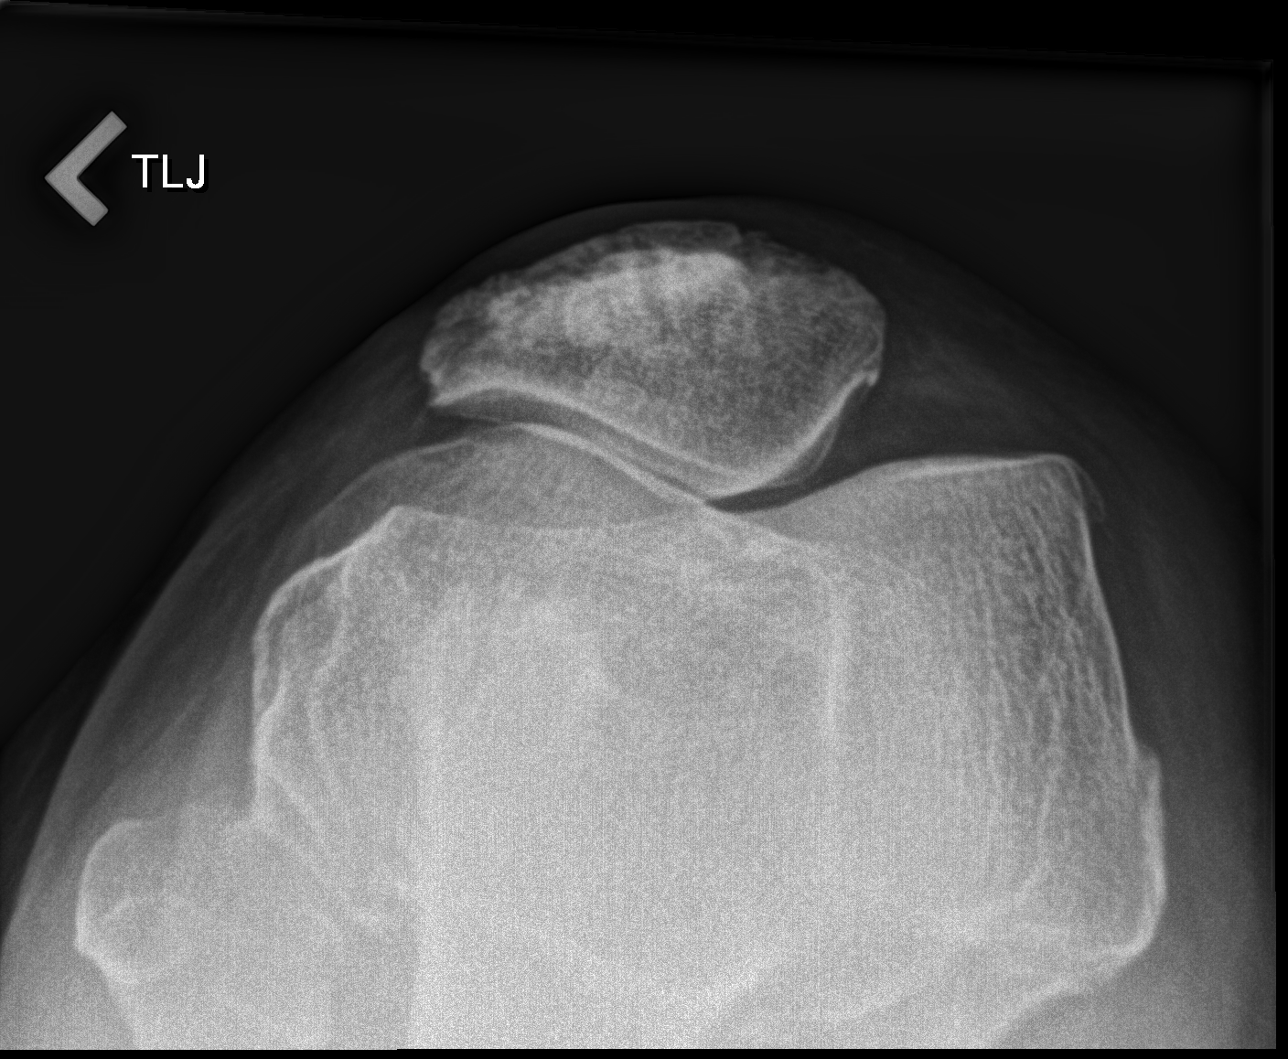

[4 of 4 positions shown; findings below may reference images not displayed]

FINDINGS: Enthesopathic changes along the inferior pole of the patella. Joint
space narrowing in the medial compartment. No joint effusion. No
acute bony abnormality. Specifically, no fracture, subluxation, or
dislocation.
IMPRESSION: Early degenerative changes.  No acute bony abnormality.

## 2023-03-08 DIAGNOSIS — E119 Type 2 diabetes mellitus without complications: Secondary | ICD-10-CM | POA: Diagnosis not present

## 2023-03-08 LAB — HM DIABETES EYE EXAM

## 2023-03-15 ENCOUNTER — Ambulatory Visit
Admission: RE | Admit: 2023-03-15 | Discharge: 2023-03-15 | Disposition: A | Discharge status: Home / Self Care | Payer: BC Managed Care – PPO | Source: Ambulatory Visit | Attending: Nurse Practitioner | Admitting: Nurse Practitioner

## 2023-03-15 DIAGNOSIS — Z1231 Encounter for screening mammogram for malignant neoplasm of breast: Secondary | ICD-10-CM

## 2023-03-23 DIAGNOSIS — L570 Actinic keratosis: Secondary | ICD-10-CM | POA: Diagnosis not present

## 2023-03-23 DIAGNOSIS — B353 Tinea pedis: Secondary | ICD-10-CM | POA: Diagnosis not present

## 2023-03-23 DIAGNOSIS — L578 Other skin changes due to chronic exposure to nonionizing radiation: Secondary | ICD-10-CM | POA: Diagnosis not present

## 2023-03-23 DIAGNOSIS — D225 Melanocytic nevi of trunk: Secondary | ICD-10-CM | POA: Diagnosis not present

## 2023-03-23 DIAGNOSIS — D2372 Other benign neoplasm of skin of left lower limb, including hip: Secondary | ICD-10-CM | POA: Diagnosis not present

## 2023-03-28 ENCOUNTER — Ambulatory Visit: Payer: BC Managed Care – PPO | Admitting: Nurse Practitioner

## 2023-03-28 ENCOUNTER — Encounter: Payer: Self-pay | Admitting: Nurse Practitioner

## 2023-03-28 VITALS — BP 170/90 | HR 60 | Temp 97.8°F | Ht 60.0 in | Wt 144.8 lb

## 2023-03-28 DIAGNOSIS — Z79899 Other long term (current) drug therapy: Secondary | ICD-10-CM | POA: Diagnosis not present

## 2023-03-28 DIAGNOSIS — E785 Hyperlipidemia, unspecified: Secondary | ICD-10-CM

## 2023-03-28 DIAGNOSIS — K760 Fatty (change of) liver, not elsewhere classified: Secondary | ICD-10-CM

## 2023-03-28 DIAGNOSIS — E039 Hypothyroidism, unspecified: Secondary | ICD-10-CM | POA: Diagnosis not present

## 2023-03-28 DIAGNOSIS — E1165 Type 2 diabetes mellitus with hyperglycemia: Secondary | ICD-10-CM

## 2023-03-28 DIAGNOSIS — E1169 Type 2 diabetes mellitus with other specified complication: Secondary | ICD-10-CM | POA: Diagnosis not present

## 2023-03-28 DIAGNOSIS — I10 Essential (primary) hypertension: Secondary | ICD-10-CM | POA: Diagnosis not present

## 2023-03-28 NOTE — Patient Instructions (Signed)
How to Take Your Blood Pressure Blood pressure measures how strongly your blood is pressing against the walls of your arteries. Arteries are blood vessels that carry blood from your heart throughout your body. You can take your blood pressure at home with a machine. You may need to check your blood pressure at home: To check if you have high blood pressure (hypertension). To check your blood pressure over time. To make sure your blood pressure medicine is working. Supplies needed: Blood pressure machine, or monitor. A chair to sit in. This should be a chair where you can sit upright with your back supported. Do not sit on a soft couch or an armchair. Table or desk. Small notebook. Pencil or pen. How to prepare Avoid these things for 30 minutes before checking your blood pressure: Having drinks with caffeine in them, such as coffee or tea. Drinking alcohol. Eating. Smoking. Exercising. Do these things five minutes before checking your blood pressure: Go to the bathroom and pee (urinate). Sit in a chair. Be quiet. Do not talk. How to take your blood pressure Follow the instructions that came with your machine. If you have a digital blood pressure monitor, these may be the instructions: Sit up straight. Place your feet on the floor. Do not cross your ankles or legs. Rest your left arm at the level of your heart. You may rest it on a table, desk, or chair. Pull up your shirt sleeve. Wrap the blood pressure cuff around the upper part of your left arm. The cuff should be 1 inch (2.5 cm) above your elbow. It is best to wrap the cuff around bare skin. Fit the cuff snugly around your arm, but not too tightly. You should be able to place only one finger between the cuff and your arm. Place the cord so that it rests in the bend of your elbow. Press the power button. Sit quietly while the cuff fills with air and loses air. Write down the numbers on the screen. Wait 2-3 minutes and then repeat  steps 1-10. What do the numbers mean? Two numbers make up your blood pressure. The first number is called systolic pressure. The second is called diastolic pressure. An example of a blood pressure reading is "120 over 80" (or 120/80). If you are an adult and do not have a medical condition, use this guide to find out if your blood pressure is normal: Normal First number: below 120. Second number: below 80. Elevated First number: 120-129. Second number: below 80. Hypertension stage 1 First number: 130-139. Second number: 80-89. Hypertension stage 2 First number: 140 or above. Second number: 90 or above. Your blood pressure is above normal even if only the first or only the second number is above normal. Follow these instructions at home: Medicines Take over-the-counter and prescription medicines only as told by your doctor. Tell your doctor if your medicine is causing side effects. General instructions Check your blood pressure as often as your doctor tells you to. Check your blood pressure at the same time every day. Take your monitor to your next doctor's appointment. Your doctor will: Make sure you are using it correctly. Make sure it is working right. Understand what your blood pressure numbers should be. Keep all follow-up visits. General tips You will need a blood pressure machine or monitor. Your doctor can suggest a monitor. You can buy one at a drugstore or online. When choosing one: Choose one with an arm cuff. Choose one that wraps around your   upper arm. Only one finger should fit between your arm and the cuff. Do not choose one that measures your blood pressure from your wrist or finger. Where to find more information American Heart Association: www.heart.org Contact a doctor if: Your blood pressure keeps being high. Your blood pressure is suddenly low. Get help right away if: Your first blood pressure number is higher than 180. Your second blood pressure number is  higher than 120. These symptoms may be an emergency. Do not wait to see if the symptoms will go away. Get help right away. Call 911. Summary Check your blood pressure at the same time every day. Avoid caffeine, alcohol, smoking, and exercise for 30 minutes before checking your blood pressure. Make sure you understand what your blood pressure numbers should be. This information is not intended to replace advice given to you by your health care provider. Make sure you discuss any questions you have with your health care provider. Document Revised: 01/13/2021 Document Reviewed: 01/13/2021 Elsevier Patient Education  2024 Elsevier Inc.  

## 2023-03-28 NOTE — Progress Notes (Signed)
Assessment and Plan:  Allison Contreras was seen today for an episodic visit.  Diagnoses and all order for this visit:  Hypothyroidism, unspecified type Controlled. Continue Levothyroxine. Reminded to take on an empty stomach 30-61mins before food.  Stop any Biotin Supplement 48-72 hours before next TSH level to reduce the risk of falsely low TSH levels. Continue to monitor.    Medication management All medications discussed and reviewed in full. All questions and concerns regarding medications addressed.    Essential hypertension Questionable medication compliance with Atenolol, hydrochlorothiazide, Olmesartan Patient reports taking as directed. Have patient log BP readings for the next 3-5 days and report back in MyChart - if continues to remain elevated will need medication adjustment. Discussed DASH (Dietary Approaches to Stop Hypertension) DASH diet is lower in sodium than a typical American diet. Cut back on foods that are high in saturated fat, cholesterol, and trans fats. Eat more whole-grain foods, fish, poultry, and nuts Remain active and exercise as tolerated daily.  Check CMP/CBC  Report to ER for any increase in stroke like symptoms, including HA, N/V, paralysis, difficulty speaking, trouble walking, confusion, vision changes, CP, heart palpitations, SOB, diaphoresis.  Hyperlipidemia  Uncontrolled - Continue Rosuvastatin  Discussed lifestyle modifications. Recommended diet heavy in fruits and veggies, omega 3's. Decrease consumption of animal meats, cheeses, and dairy products. Remain active and exercise as tolerated. Continue to monitor. Check lipids/TSH  Hepatic Steatosis Resolved Monitor levels  Vitamin D deficiency Continue supplement for goal of 60-100 Monitor Vitamin D levels  Elevated calcium Monitor levels  Diabetes Continue Metformin.  Education: Reviewed 'ABCs' of diabetes management  Discussed goals to be met and/or maintained include A1C  (<7) Blood pressure (<130/80) Cholesterol (LDL <70) Continue Eye Exam yearly  Continue Dental Exam Q6 mo Discussed dietary recommendations Discussed Physical Activity recommendations Check A1C  Orders Placed This Encounter  Procedures   COMPLETE METABOLIC PANEL WITH GFR   CBC with Differential/Platelet   Lipid panel   Hemoglobin A1c   TSH   Notify office for further evaluation and treatment, questions or concerns if s/s fail to improve. The risks and benefits of my recommendations, as well as other treatment options were discussed with the patient today. Questions were answered.   Notify office for further evaluation and treatment, questions or concerns if any reported s/s fail to improve.   The patient was advised to call back or seek an in-person evaluation if any symptoms worsen or if the condition fails to improve as anticipated.   Further disposition pending results of labs. Discussed med's effects and SE's.    I discussed the assessment and treatment plan with the patient. The patient was provided an opportunity to ask questions and all were answered. The patient agreed with the plan and demonstrated an understanding of the instructions.  Discussed med's effects and SE's. Screening labs and tests as requested with regular follow-up as recommended.  I provided 30 minutes of face-to-face time during this encounter including counseling, chart review, and critical decision making was preformed.  Today's Plan of Care is based on a patient-centered health care approach known as shared decision making - the decisions, tests and treatments allow for patient preferences and values to be balanced with clinical evidence.     Future Appointments  Date Time Provider Department Center  03/28/2023  9:15 AM Adela Glimpse, NP [facility] None  04/09/2023  4:00 PM Olivia Mackie, NP [facility] None  07/02/2023 10:30 AM Lucky Cowboy, MD [facility] None  01/03/2024  3:00  PM Lucky Cowboy, MD [facility] None    ------------------------------------------------------------------------------------------------------------------   HPI LMP 09/06/2016   57 y.o.female presents for general follow up.   Overall she reports feeling well.  She has recently introduced a new granddaughter to her family.   Her BP is elevated in clinic today.  She reports medication compliance. She was recently switched from Losartan to Olmesartan.  She takes Olmesartan at night.  She continues Atenolol and  HCTZ during the day.  She is asymptomatic.    She has a hx of hypothyroidism.  She is continuing to take Levothyroxine 112 mcg daily.  TSH levels have been abnormal in the past.  She is on cholesterol medication Rosuvastatin and denies myalgias. Her cholesterol is not at goal. The cholesterol last visit was:   Lab Results  Component Value Date   CHOL 170 12/26/2022   HDL 32 (L) 12/26/2022   LDLCALC 95 12/26/2022   TRIG 322 (H) 12/26/2022   CHOLHDL 5.3 (H) 12/26/2022   She has a hx of DM2.  Denies any symptoms of polyuria and polydipsia.  Takes Metformin as directed.  Lab Results  Component Value Date   HGBA1C 6.7 (H) 12/26/2022     Past Medical History:  Diagnosis Date   Hypertension    Hypothyroidism    Splenomegaly 02/14/2019   02/14/2019 abd [place]: Spleen measures 14.1 x 15.6 x 4.7 cm with a measured splenic volume of 542 cubic cm. No focal splenic lesions are evident.     Allergies  Allergen Reactions   Codeine    Ppd [Tuberculin Purified Protein Derivative]     Positive reaction 2001   Lisinopril     Dry cough    Current Outpatient Medications on File Prior to Visit  Medication Sig   acetaminophen (TYLENOL) 500 MG tablet Take 500 mg by mouth every 6 (six) hours as needed.   atenolol (TENORMIN) 100 MG tablet Take  1 tablet  Daily  for BP                                                                                 /                                                                    TAKE                                         BY                                                 MOUTH   Cholecalciferol 125 MCG (5000 UT) capsule Take 5,000 Units by mouth daily.   dexamethasone (DECADRON) 1 MG tablet Take 3 tabs  for 3 days, 2 tabs for 3 days 1 tab for 5 days. Take with food.   fenofibrate micronized (LOFIBRA) 134 MG capsule TAKE 1 CAPSULE DAILY FOR TRIGLYCERIDES ( BLOOD FATS )   hydrochlorothiazide (HYDRODIURIL) 25 MG tablet TAKE 1 TABLET BY MOUTH EVERY MORNING FOR BLOOD PRESSURE AND FLUID RETENTION   Lancets (ONETOUCH DELICA PLUS LANCET30G) MISC USE TO TEST BLOOD SUGAR ONCE DAILY   levothyroxine (SYNTHROID) 112 MCG tablet Take  1 tablet  Daily  on an empty stomach with only water for 30 minutes & no Antacid meds, Calcium or Magnesium for 4 hours & avoid Biotin   metFORMIN (GLUCOPHAGE-XR) 500 MG 24 hr tablet TAKE 2 TABLETS 2 X /DAY WITH MEALS FOR DIABETES   olmesartan (BENICAR) 40 MG tablet TAKE 1 TABLET EVERY NIGHT FOR BP & DIABETIC KIDNEY PROTECTION   ONETOUCH VERIO test strip CHECK BLOOD SUGAR DAILY ( DX: E.11 )   rosuvastatin (CRESTOR) 10 MG tablet Take 1 tablet Daily for Cholesterol   No current facility-administered medications on file prior to visit.    ROS: all negative except what is noted in the HPI.   Physical Exam:  LMP 09/06/2016   General Appearance: NAD.  Awake, conversant and cooperative. Eyes: PERRLA, EOMs intact.  Sclera white.  Conjunctiva without erythema. Sinuses: No frontal/maxillary tenderness.  No nasal discharge. Nares patent.  ENT/Mouth: Ext aud canals clear.  Bilateral TMs w/DOL and without erythema or bulging. Hearing intact.  Posterior pharynx without swelling or exudate.  Tonsils without swelling or erythema.  Neck: Supple.  No masses, nodules or thyromegaly. Respiratory: Effort is regular with non-labored breathing. Breath sounds are equal bilaterally without rales, rhonchi, wheezing or stridor.  Cardio: RRR with no MRGs. Brisk  peripheral pulses without edema.  Abdomen: Active BS in all four quadrants.  Soft and non-tender without guarding, rebound tenderness, hernias or masses. Lymphatics: Non tender without lymphadenopathy.  Musculoskeletal: Full ROM, 5/5 strength, normal ambulation.  No clubbing or cyanosis. Skin: Appropriate color for ethnicity. Warm without rashes, lesions, ecchymosis, ulcers.  Neuro: CN II-XII grossly normal. Normal muscle tone without cerebellar symptoms and intact sensation.   Psych: AO X 3,  appropriate mood and affect, insight and judgment.     Adela Glimpse, NP 8:56 AM [facility]

## 2023-03-29 LAB — COMPLETE METABOLIC PANEL WITH GFR
AG Ratio: 1.6 (calc) (ref 1.0–2.5)
ALT: 17 U/L (ref 6–29)
AST: 14 U/L (ref 10–35)
Albumin: 4.6 g/dL (ref 3.6–5.1)
Alkaline phosphatase (APISO): 38 U/L (ref 37–153)
BUN/Creatinine Ratio: 15 (calc) (ref 6–22)
BUN: 16 mg/dL (ref 7–25)
CO2: 26 mmol/L (ref 20–32)
Calcium: 9.7 mg/dL (ref 8.6–10.4)
Chloride: 102 mmol/L (ref 98–110)
Creat: 1.05 mg/dL — ABNORMAL HIGH (ref 0.50–1.03)
Globulin: 2.8 g/dL (ref 1.9–3.7)
Glucose, Bld: 132 mg/dL — ABNORMAL HIGH (ref 65–99)
Potassium: 4.4 mmol/L (ref 3.5–5.3)
Sodium: 138 mmol/L (ref 135–146)
Total Bilirubin: 0.4 mg/dL (ref 0.2–1.2)
Total Protein: 7.4 g/dL (ref 6.1–8.1)
eGFR: 62 mL/min/{1.73_m2} (ref >=60)

## 2023-03-29 LAB — CBC WITH DIFFERENTIAL/PLATELET
Absolute Lymphocytes: 2167 {cells}/uL (ref 850–3900)
Absolute Monocytes: 623 {cells}/uL (ref 200–950)
Basophils Absolute: 37 {cells}/uL (ref 0–200)
Basophils Relative: 0.4 %
Eosinophils Absolute: 121 {cells}/uL (ref 15–500)
Eosinophils Relative: 1.3 %
HCT: 39.6 % (ref 35.0–45.0)
Hemoglobin: 12.7 g/dL (ref 11.7–15.5)
MCH: 27.9 pg (ref 27.0–33.0)
MCHC: 32.1 g/dL (ref 32.0–36.0)
MCV: 87 fL (ref 80.0–100.0)
MPV: 10.3 fL (ref 7.5–12.5)
Monocytes Relative: 6.7 %
Neutro Abs: 6352 {cells}/uL (ref 1500–7800)
Neutrophils Relative %: 68.3 %
Platelets: 285 10*3/uL (ref 140–400)
RBC: 4.55 10*6/uL (ref 3.80–5.10)
RDW: 13.1 % (ref 11.0–15.0)
Total Lymphocyte: 23.3 %
WBC: 9.3 10*3/uL (ref 3.8–10.8)

## 2023-03-29 LAB — LIPID PANEL
Cholesterol: 149 mg/dL (ref <200)
HDL: 38 mg/dL — ABNORMAL LOW (ref >=50)
LDL Cholesterol (Calc): 83 mg/dL
Non-HDL Cholesterol (Calc): 111 mg/dL (ref <130)
Total CHOL/HDL Ratio: 3.9 (calc) (ref <5.0)
Triglycerides: 189 mg/dL — ABNORMAL HIGH (ref <150)

## 2023-03-29 LAB — HEMOGLOBIN A1C
Hgb A1c MFr Bld: 6.3 %{Hb} — ABNORMAL HIGH (ref <5.7)
Mean Plasma Glucose: 134 mg/dL
eAG (mmol/L): 7.4 mmol/L

## 2023-03-29 LAB — TSH: TSH: 0.58 m[IU]/L (ref 0.40–4.50)

## 2023-04-04 ENCOUNTER — Encounter: Payer: Self-pay | Admitting: Internal Medicine

## 2023-04-09 ENCOUNTER — Ambulatory Visit (INDEPENDENT_AMBULATORY_CARE_PROVIDER_SITE_OTHER): Payer: BC Managed Care – PPO | Admitting: Nurse Practitioner

## 2023-04-09 ENCOUNTER — Encounter: Payer: Self-pay | Admitting: Nurse Practitioner

## 2023-04-09 ENCOUNTER — Other Ambulatory Visit (HOSPITAL_COMMUNITY)
Admission: RE | Admit: 2023-04-09 | Discharge: 2023-04-09 | Disposition: A | Discharge status: Home / Self Care | Payer: BC Managed Care – PPO | Source: Ambulatory Visit | Attending: Nurse Practitioner | Admitting: Nurse Practitioner

## 2023-04-09 VITALS — BP 148/82 | HR 62 | Ht 61.25 in | Wt 146.0 lb

## 2023-04-09 DIAGNOSIS — Z01419 Encounter for gynecological examination (general) (routine) without abnormal findings: Secondary | ICD-10-CM | POA: Diagnosis not present

## 2023-04-09 DIAGNOSIS — Z780 Asymptomatic menopausal state: Secondary | ICD-10-CM

## 2023-04-09 DIAGNOSIS — Z124 Encounter for screening for malignant neoplasm of cervix: Secondary | ICD-10-CM | POA: Diagnosis not present

## 2023-04-09 NOTE — Progress Notes (Signed)
   Allison Contreras March 04, 1966 [id number]   History:  57 y.o. G2P2 presents for annual exam. Postmenopausal - no HRT, no bleeding. Normal pap and mammogram history. T2DM, HTN, HLD, and hypothyroidism managed by PCP.   Gynecologic History Contraception: post menopausal status Sexually active: Yes  Health maintenance Last Pap: 03/27/2018. Results were: Normal neg HPV Last mammogram: 03/15/2023. Results were: Normal Last colonoscopy: Never. Negative Cologuard 11/2019 Last Dexa: 04/09/2019. Results were: Normal  Past medical history, past surgical history, family history and social history were all reviewed and documented in the EPIC chart. Married. Retired. 2 yo daughter with archeology degree, looking for job. 42 yo son - has 28 yo daughter, 1 mo daughter.   ROS:  A ROS was performed and pertinent positives and negatives are included.  Exam:  Vitals:   04/09/23 1608  BP: (!) 148/82  Pulse: 62  SpO2: 97%  Weight: 146 lb (66.2 kg)  Height: 5' 1.25" (1.556 m)      Body mass index is 27.36 kg/m.  General appearance:  Normal Thyroid:  Symmetrical, normal in size, without palpable masses or nodularity. Respiratory  Auscultation:  Clear without wheezing or rhonchi Cardiovascular  Auscultation:  Regular rate, without rubs, murmurs or gallops  Edema/varicosities:  Not grossly evident Abdominal  Soft,nontender, without masses, guarding or rebound.  Liver/spleen:  No organomegaly noted  Hernia:  None appreciated  Skin  Inspection:  Grossly normal   Breasts: Examined lying and sitting.   Right: Without masses, retractions, discharge or axillary adenopathy.   Left: Without masses, retractions, discharge or axillary adenopathy. Pelvic: External genitalia:  no lesions              Urethra:  normal appearing urethra with no masses, tenderness or lesions              Bartholins and Skenes: normal                 Vagina: normal appearing vagina with normal color and discharge, no  lesions. Atrophic changes              Cervix: no lesions Bimanual Exam:  Uterus:  no masses or tenderness              Adnexa: no mass, fullness, tenderness              Rectovaginal: Deferred              Anus:  normal, no lesions  Patient informed chaperone available to be present for breast and pelvic exam. Patient has requested no chaperone to be present. Patient has been advised what will be completed during breast and pelvic exam.   Assessment/Plan:  57 y.o. G2P2 for annual exam.   Well female exam with routine gynecological exam - Education provided on SBEs, importance of preventative screenings, current guidelines, high calcium diet, regular exercise, and multivitamin daily. Labs with PCP.   Postmenopausal - no HRT, no bleeding.   Cervical cancer screening - Plan: Cytology - PAP( [place]). Normal pap history.   Screening for breast cancer - Normal mammogram history. Continue annual screenings. Normal breast exam today.   Screening for colon cancer - Negative Cologuard 11/2019. Received in emails. Plans to repeat soon.   Screening for osteoporosis - Normal bone density 03/2019. Will repeat at 5-year interval per recommendation.   Return in about 1 year (around 04/08/2024) for Annual.     Olivia Mackie [facility], 4:29 PM 04/09/2023

## 2023-04-13 LAB — CYTOLOGY - PAP
Comment: NEGATIVE
Diagnosis: UNDETERMINED — AB
High risk HPV: NEGATIVE

## 2023-05-03 ENCOUNTER — Ambulatory Visit (INDEPENDENT_AMBULATORY_CARE_PROVIDER_SITE_OTHER): Payer: BC Managed Care – PPO

## 2023-05-03 VITALS — Temp 97.7°F

## 2023-05-03 DIAGNOSIS — Z23 Encounter for immunization: Secondary | ICD-10-CM

## 2023-06-19 ENCOUNTER — Other Ambulatory Visit: Payer: Self-pay | Admitting: Internal Medicine

## 2023-06-19 DIAGNOSIS — I10 Essential (primary) hypertension: Secondary | ICD-10-CM

## 2023-06-23 ENCOUNTER — Other Ambulatory Visit: Payer: Self-pay | Admitting: Internal Medicine

## 2023-06-23 DIAGNOSIS — E1165 Type 2 diabetes mellitus with hyperglycemia: Secondary | ICD-10-CM

## 2023-06-29 ENCOUNTER — Other Ambulatory Visit: Payer: Self-pay

## 2023-06-29 DIAGNOSIS — E1122 Type 2 diabetes mellitus with diabetic chronic kidney disease: Secondary | ICD-10-CM

## 2023-06-29 MED ORDER — METFORMIN HCL ER 500 MG PO TB24: ORAL_TABLET | ORAL | 3 refills | Status: AC

## 2023-07-02 ENCOUNTER — Ambulatory Visit: Payer: BC Managed Care – PPO | Admitting: Internal Medicine

## 2023-07-02 DIAGNOSIS — I10 Essential (primary) hypertension: Secondary | ICD-10-CM | POA: Diagnosis not present

## 2023-07-02 DIAGNOSIS — E559 Vitamin D deficiency, unspecified: Secondary | ICD-10-CM | POA: Diagnosis not present

## 2023-07-02 DIAGNOSIS — E119 Type 2 diabetes mellitus without complications: Secondary | ICD-10-CM | POA: Diagnosis not present

## 2023-07-02 DIAGNOSIS — E785 Hyperlipidemia, unspecified: Secondary | ICD-10-CM | POA: Diagnosis not present

## 2023-08-28 DIAGNOSIS — I10 Essential (primary) hypertension: Secondary | ICD-10-CM | POA: Diagnosis not present

## 2023-08-28 DIAGNOSIS — J019 Acute sinusitis, unspecified: Secondary | ICD-10-CM | POA: Diagnosis not present

## 2023-09-25 DIAGNOSIS — E119 Type 2 diabetes mellitus without complications: Secondary | ICD-10-CM | POA: Diagnosis not present

## 2023-09-25 DIAGNOSIS — E039 Hypothyroidism, unspecified: Secondary | ICD-10-CM | POA: Diagnosis not present

## 2023-09-25 DIAGNOSIS — I10 Essential (primary) hypertension: Secondary | ICD-10-CM | POA: Diagnosis not present

## 2023-09-25 DIAGNOSIS — Z7984 Long term (current) use of oral hypoglycemic drugs: Secondary | ICD-10-CM | POA: Diagnosis not present

## 2023-09-27 DIAGNOSIS — E039 Hypothyroidism, unspecified: Secondary | ICD-10-CM | POA: Diagnosis not present

## 2023-09-27 DIAGNOSIS — K120 Recurrent oral aphthae: Secondary | ICD-10-CM | POA: Diagnosis not present

## 2023-12-26 DIAGNOSIS — E039 Hypothyroidism, unspecified: Secondary | ICD-10-CM | POA: Diagnosis not present

## 2023-12-26 DIAGNOSIS — Z23 Encounter for immunization: Secondary | ICD-10-CM | POA: Diagnosis not present

## 2023-12-26 DIAGNOSIS — E119 Type 2 diabetes mellitus without complications: Secondary | ICD-10-CM | POA: Diagnosis not present

## 2023-12-26 DIAGNOSIS — I10 Essential (primary) hypertension: Secondary | ICD-10-CM | POA: Diagnosis not present

## 2023-12-26 DIAGNOSIS — Z0000 Encounter for general adult medical examination without abnormal findings: Secondary | ICD-10-CM | POA: Diagnosis not present

## 2023-12-26 DIAGNOSIS — E782 Mixed hyperlipidemia: Secondary | ICD-10-CM | POA: Diagnosis not present

## 2023-12-27 DIAGNOSIS — I6523 Occlusion and stenosis of bilateral carotid arteries: Secondary | ICD-10-CM | POA: Diagnosis not present

## 2023-12-27 DIAGNOSIS — R0989 Other specified symptoms and signs involving the circulatory and respiratory systems: Secondary | ICD-10-CM | POA: Diagnosis not present

## 2024-01-03 ENCOUNTER — Encounter: Payer: BC Managed Care – PPO | Admitting: Internal Medicine

## 2024-01-08 DIAGNOSIS — Z1212 Encounter for screening for malignant neoplasm of rectum: Secondary | ICD-10-CM | POA: Diagnosis not present

## 2024-01-08 DIAGNOSIS — Z1211 Encounter for screening for malignant neoplasm of colon: Secondary | ICD-10-CM | POA: Diagnosis not present

## 2024-01-15 LAB — COLOGUARD: COLOGUARD: NEGATIVE

## 2024-01-23 DIAGNOSIS — I6523 Occlusion and stenosis of bilateral carotid arteries: Secondary | ICD-10-CM | POA: Diagnosis not present

## 2024-01-23 DIAGNOSIS — E785 Hyperlipidemia, unspecified: Secondary | ICD-10-CM | POA: Diagnosis not present

## 2024-01-23 DIAGNOSIS — I10 Essential (primary) hypertension: Secondary | ICD-10-CM | POA: Diagnosis not present

## 2024-01-23 DIAGNOSIS — E119 Type 2 diabetes mellitus without complications: Secondary | ICD-10-CM | POA: Diagnosis not present

## 2024-02-07 DIAGNOSIS — E119 Type 2 diabetes mellitus without complications: Secondary | ICD-10-CM | POA: Diagnosis not present

## 2024-02-07 DIAGNOSIS — E039 Hypothyroidism, unspecified: Secondary | ICD-10-CM | POA: Diagnosis not present

## 2024-02-11 ENCOUNTER — Other Ambulatory Visit: Payer: Self-pay | Admitting: Nurse Practitioner

## 2024-02-11 DIAGNOSIS — Z13820 Encounter for screening for osteoporosis: Secondary | ICD-10-CM

## 2024-02-11 DIAGNOSIS — Z780 Asymptomatic menopausal state: Secondary | ICD-10-CM

## 2024-02-13 ENCOUNTER — Other Ambulatory Visit: Payer: Self-pay | Admitting: Nurse Practitioner

## 2024-02-13 DIAGNOSIS — Z0000 Encounter for general adult medical examination without abnormal findings: Secondary | ICD-10-CM

## 2024-03-11 DIAGNOSIS — E119 Type 2 diabetes mellitus without complications: Secondary | ICD-10-CM | POA: Diagnosis not present

## 2024-03-11 DIAGNOSIS — I209 Angina pectoris, unspecified: Secondary | ICD-10-CM | POA: Diagnosis not present

## 2024-03-17 ENCOUNTER — Ambulatory Visit
Admission: RE | Admit: 2024-03-17 | Discharge: 2024-03-17 | Disposition: A | Source: Ambulatory Visit | Attending: Nurse Practitioner

## 2024-03-17 DIAGNOSIS — Z1231 Encounter for screening mammogram for malignant neoplasm of breast: Secondary | ICD-10-CM | POA: Diagnosis not present

## 2024-03-17 DIAGNOSIS — Z0000 Encounter for general adult medical examination without abnormal findings: Secondary | ICD-10-CM

## 2024-03-18 ENCOUNTER — Other Ambulatory Visit (HOSPITAL_COMMUNITY): Payer: Self-pay | Admitting: Physician Assistant

## 2024-03-18 DIAGNOSIS — I2089 Other forms of angina pectoris: Secondary | ICD-10-CM

## 2024-03-24 DIAGNOSIS — E039 Hypothyroidism, unspecified: Secondary | ICD-10-CM | POA: Diagnosis not present

## 2024-03-27 DIAGNOSIS — L570 Actinic keratosis: Secondary | ICD-10-CM | POA: Diagnosis not present

## 2024-03-27 DIAGNOSIS — L578 Other skin changes due to chronic exposure to nonionizing radiation: Secondary | ICD-10-CM | POA: Diagnosis not present

## 2024-03-27 DIAGNOSIS — D2372 Other benign neoplasm of skin of left lower limb, including hip: Secondary | ICD-10-CM | POA: Diagnosis not present

## 2024-03-27 DIAGNOSIS — D225 Melanocytic nevi of trunk: Secondary | ICD-10-CM | POA: Diagnosis not present

## 2024-03-27 DIAGNOSIS — B353 Tinea pedis: Secondary | ICD-10-CM | POA: Diagnosis not present

## 2024-03-27 DIAGNOSIS — D485 Neoplasm of uncertain behavior of skin: Secondary | ICD-10-CM | POA: Diagnosis not present

## 2024-04-09 ENCOUNTER — Encounter: Payer: Self-pay | Admitting: Nurse Practitioner

## 2024-04-09 ENCOUNTER — Ambulatory Visit: Admitting: Nurse Practitioner

## 2024-04-09 VITALS — BP 136/74 | HR 77 | Ht 61.0 in | Wt 139.0 lb

## 2024-04-09 DIAGNOSIS — Z1331 Encounter for screening for depression: Secondary | ICD-10-CM | POA: Diagnosis not present

## 2024-04-09 DIAGNOSIS — N951 Menopausal and female climacteric states: Secondary | ICD-10-CM | POA: Diagnosis not present

## 2024-04-09 DIAGNOSIS — Z780 Asymptomatic menopausal state: Secondary | ICD-10-CM

## 2024-04-09 DIAGNOSIS — Z01419 Encounter for gynecological examination (general) (routine) without abnormal findings: Secondary | ICD-10-CM | POA: Diagnosis not present

## 2024-04-09 NOTE — Progress Notes (Signed)
 Allison Contreras 06-25-1965 [id number]   History:  58 y.o. G2P2 presents for annual exam. Postmenopausal - no HRT, no bleeding. Complains of vaginal dryness with intercourse, no pain. Has not tried any lubricants. Normal pap history. T2DM, HTN, HLD, and hypothyroidism managed by PCP.   Gynecologic History Contraception: post menopausal status Sexually active: Yes  Health maintenance Last Pap: 04/09/2023. Results were: ASCUS neg HPV Last mammogram: 03/17/2024. Results were: Normal Last colonoscopy: Never. Negative Cologuard 12/2023 Last Dexa: 04/09/2019. Results were: Normal     04/09/2024    3:58 PM  Depression screen PHQ 2/9  Decreased Interest 0  Down, Depressed, Hopeless 0  PHQ - 2 Score 0     Past medical history, past surgical history, family history and social history were all reviewed and documented in the EPIC chart. Married. Retired. 45 yo daughter. 20 yo son - has 1 and 58 yo daughters.   ROS:  A ROS was performed and pertinent positives and negatives are included.  Exam:  Vitals:   04/09/24 1557  BP: 136/74  Pulse: 77  SpO2: 99%  Weight: 139 lb (63 kg)  Height: 5' 1 (1.549 m)    Body mass index is 26.26 kg/m.  General appearance:  Normal Thyroid :  Symmetrical, normal in size, without palpable masses or nodularity. Respiratory  Auscultation:  Clear without wheezing or rhonchi Cardiovascular  Auscultation:  Regular rate, without rubs, murmurs or gallops  Edema/varicosities:  Not grossly evident Abdominal  Soft,nontender, without masses, guarding or rebound.  Liver/spleen:  No organomegaly noted  Hernia:  None appreciated  Skin  Inspection:  Grossly normal   Breasts: Examined lying and sitting.   Right: Without masses, retractions, discharge or axillary adenopathy.   Left: Without masses, retractions, discharge or axillary adenopathy. Pelvic: External genitalia:  no lesions              Urethra:  normal appearing urethra with no masses, tenderness  or lesions              Bartholins and Skenes: normal                 Vagina: normal appearing vagina with normal color and discharge, no lesions. Atrophic changes              Cervix: no lesions Bimanual Exam:  Uterus:  no masses or tenderness              Adnexa: no mass, fullness, tenderness              Rectovaginal: Deferred              Anus:  normal, no lesions  Darice Hoit, CMA present as chaperone.   Assessment/Plan:  58 y.o. G2P2 for annual exam.   Well female exam with routine gynecological exam - Education provided on SBEs, importance of preventative screenings, current guidelines, high calcium  diet, regular exercise, and multivitamin daily. Labs with PCP.   Postmenopausal - no HRT, no bleeding.   Depression screening - PHQ - 0  Menopausal vaginal dryness - [employer] L[employer] samples provided. Discussed vaginal estrogen. Will consider.  Cervical cancer screening - Normal pap history. 03/2023 ASCUS neg HPV. Will repeat pap at 3-year interval per guidelines.   Screening for breast cancer - Normal mammogram history. Continue annual screenings. Normal breast exam today.   Screening for colon cancer - Negative Cologuard 12/2023.  Screening for osteoporosis - Normal bone density 03/2019. DXA scheduled in April.   Return in about  1 year (around 04/09/2025) for Annual.     Annabella DELENA [facility], 4:18 PM 04/09/2024

## 2024-04-15 ENCOUNTER — Ambulatory Visit: Attending: Cardiology | Admitting: Cardiology

## 2024-04-15 ENCOUNTER — Encounter: Payer: Self-pay | Admitting: Cardiology

## 2024-04-15 VITALS — BP 185/77 | HR 69 | Resp 16 | Ht 61.0 in | Wt 137.4 lb

## 2024-04-15 DIAGNOSIS — E785 Hyperlipidemia, unspecified: Secondary | ICD-10-CM

## 2024-04-15 DIAGNOSIS — E1169 Type 2 diabetes mellitus with other specified complication: Secondary | ICD-10-CM | POA: Diagnosis not present

## 2024-04-15 DIAGNOSIS — R072 Precordial pain: Secondary | ICD-10-CM

## 2024-04-15 DIAGNOSIS — E119 Type 2 diabetes mellitus without complications: Secondary | ICD-10-CM

## 2024-04-15 DIAGNOSIS — I6523 Occlusion and stenosis of bilateral carotid arteries: Secondary | ICD-10-CM

## 2024-04-15 DIAGNOSIS — E781 Pure hyperglyceridemia: Secondary | ICD-10-CM

## 2024-04-15 DIAGNOSIS — I10 Essential (primary) hypertension: Secondary | ICD-10-CM

## 2024-04-15 MED ORDER — METOPROLOL TARTRATE 100 MG PO TABS: 100.0000 mg | ORAL_TABLET | Freq: Once | ORAL | 0 refills | Status: DC

## 2024-04-15 MED ORDER — ASPIRIN 81 MG PO TBEC: 81.0000 mg | DELAYED_RELEASE_TABLET | Freq: Every day | ORAL | Status: AC

## 2024-04-15 NOTE — Progress Notes (Signed)
 Cardiology Office Note:    Date:  04/15/2024  NAME:  Allison Contreras    MRN: [id number] DOB:  May 10, 1966   PCP:  Stacia Millman, PA  Former Cardiology Providers: None Primary Cardiologist:  Madonna Large, DO, [facility] (established care 04/15/2024) Electrophysiologist:  None   Referring MD: Stacia Millman, PA  Reason of Consult: Angina, coronary artery disease  Chief Complaint  Patient presents with   Chest Pain   New Patient (Initial Visit)    History of Present Illness:    Allison Contreras is a 58 y.o. Caucasian female whose past medical history and cardiovascular risk factors includes: Carotid artery disease, diabetes mellitus type 2, hypertension, hyperlipidemia, hypertriglyceridemia hypothyroidism.  She is being seen today for the evaluation of angina, carotid artery disease  at the request of Stacia, Alexus, PA.  Chest Pain Onset: August 2025 Last occurrence: 3 weeks ago Location: substernal , pressure, radiates to back Intensity: 4/10 Frequency: random. Duration: 10 minutes  Improving factors: rest Worsening factors: cold air and over exertion. Do symptoms worsen with effort related activities: Yes Do symptoms improve with resting or sublingual nitroglycerin tabs: Yes Risk Factors: Carotid Disease, Lipids, and NIDDM  Home systolic blood pressures range between 110-145 mmHg and diastolic blood pressures range between 55-70 mmHg on current medical therapy.  Patient states that her primary care appreciated a carotid bruit and had a carotid duplex performed in August 2025.  She provides me with the results on her smart phone which are summarized below.  She has neither been evaluated by vascular surgery nor undergone additional imaging studies.  Patient is accompanied by her husband [name] who provides collateral history.  Dose of Crestor  was just increased in August 2025, per patient.  Patient is scheduled for a exercise treadmill stress test in the coming  weeks  Current Medications: Current Meds  Medication Sig   acetaminophen (TYLENOL) 500 MG tablet Take 500 mg by mouth every 6 (six) hours as needed.   ALPRAZolam  (XANAX ) 0.5 MG tablet 1 tablet Orally Twice a day As needed   aspirin EC 81 MG tablet Take 1 tablet (81 mg total) by mouth daily. Swallow whole.   carvedilol (COREG) 3.125 MG tablet Take 3.125 mg by mouth 2 (two) times daily.   Cholecalciferol 125 MCG (5000 UT) capsule Take 5,000 Units by mouth daily.   fenofibrate  micronized (LOFIBRA) 134 MG capsule TAKE 1 CAPSULE DAILY FOR TRIGLYCERIDES ( BLOOD FATS )   hydrochlorothiazide  (HYDRODIURIL ) 25 MG tablet TAKE 1 TABLET BY MOUTH EVERY MORNING FOR BLOOD PRESSURE AND FLUID RETENTION   Lancets (ONETOUCH DELICA PLUS LANCET30G) MISC USE TO TEST BLOOD SUGAR ONCE DAILY   levothyroxine  (SYNTHROID ) 100 MCG tablet 1 tablet in the morning on an empty stomach Orally Once a day; Duration: 90 days   metFORMIN  (GLUCOPHAGE -XR) 500 MG 24 hr tablet Take  2 tablets  2 x /day  with Meals  for Diabetes   olmesartan  (BENICAR ) 40 MG tablet TAKE 1 TABLET EVERY NIGHT FOR BP & DIABETIC KIDNEY PROTECTION   ONETOUCH VERIO test strip CHECK BLOOD SUGAR DAILY ( DX: E.11 )   rosuvastatin  (CRESTOR ) 20 MG tablet Take 20 mg by mouth daily.   [DISCONTINUED] metoprolol tartrate (LOPRESSOR) 100 MG tablet Take 1 tablet (100 mg total) by mouth once for 1 dose. TAKE TWO HOURS PRIOR TO  SCHEDULE CARDIAC TEST     Allergies:    Codeine, Ppd [tuberculin purified protein derivative], and Lisinopril    Past Medical History: Past Medical History:  Diagnosis Date   Chest pain    Hypertension    Hypothyroidism    Splenomegaly 02/14/2019   02/14/2019 abd US : Spleen measures 14.1 x 15.6 x 4.7 cm with a measured splenic volume of 542 cubic cm. No focal splenic lesions are evident.    Past Surgical History: Past Surgical History:  Procedure Laterality Date   LYMPH NODE DISSECTION     NECK- BENIGN    Social History: Social  History   Tobacco Use   Smoking status: Never    Passive exposure: Past   Smokeless tobacco: Never  Vaping Use   Vaping status: Never Used  Substance Use Topics   Alcohol use: Yes    Comment: occ.   Drug use: No    Family History: Family History  Problem Relation Age of Onset   Cancer Mother        NON HODGKINS LYMPHOMA   Hypertension Mother    Diabetes Mother    Hyperlipidemia Mother    Hypertension Father    Heart disease Father    Breast cancer Neg Hx     ROS:   Review of Systems  Cardiovascular:  Positive for chest pain. Negative for claudication, irregular heartbeat, leg swelling, near-syncope, orthopnea, palpitations, paroxysmal nocturnal dyspnea and syncope.  Respiratory:  Negative for shortness of breath.   Hematologic/Lymphatic: Negative for bleeding problem.    EKGs/Labs/Other Studies Reviewed:   EKG: EKG Interpretation Date/Time:  Tuesday April 15 2024 13:31:47 EST Ventricular Rate:  69 PR Interval:  166 QRS Duration:  92 QT Interval:  416 QTC Calculation: 445 R Axis:   53  Text Interpretation: Normal sinus rhythm Cannot rule out Inferior infarct , age undetermined ST & T wave abnormality, consider anterolateral ischemia and Inferior ischemia No previous ECGs available Confirmed by Michele Richardson [id number]) on 04/15/2024 2:12:39 PM  Echocardiogram: Will order  Coronary CTA: Will order  Carotid duplex Performed at [facility] physicians, results provided with the patient on her smart phone Left ICA proximal 70-99%. Right ICA proximal 50-69% Patent bilateral vertebral arteries  Labs:    Latest Ref Rng & Units 03/28/2023    9:37 AM 12/26/2022    3:03 PM 09/19/2022   11:03 AM  CBC  WBC 3.8 - 10.8 Thousand/uL 9.3  9.2  8.2   Hemoglobin 11.7 - 15.5 g/dL 87.2  87.0  87.3   Hematocrit 35.0 - 45.0 % 39.6  39.3  38.2   Platelets 140 - 400 Thousand/uL 285  305  264        Latest Ref Rng & Units 03/28/2023    9:37 AM 12/26/2022    3:03 PM 09/19/2022   11:03  AM  BMP  Glucose 65 - 99 mg/dL 867  96  863   BUN 7 - 25 mg/dL 16  18  14    Creatinine 0.50 - 1.03 mg/dL 8.94  8.96  9.06   BUN/Creat Ratio 6 - 22 (calc) 15  SEE NOTE:  SEE NOTE:   Sodium 135 - 146 mmol/L 138  138  139   Potassium 3.5 - 5.3 mmol/L 4.4  4.4  4.2   Chloride 98 - 110 mmol/L 102  100  102   CO2 20 - 32 mmol/L 26  29  27    Calcium  8.6 - 10.4 mg/dL 9.7  89.3  89.7       Latest Ref Rng & Units 03/28/2023    9:37 AM 12/26/2022    3:03 PM 09/19/2022   11:03 AM  CMP  Glucose 65 - 99 mg/dL 867  96  863   BUN 7 - 25 mg/dL 16  18  14    Creatinine 0.50 - 1.03 mg/dL 8.94  8.96  9.06   Sodium 135 - 146 mmol/L 138  138  139   Potassium 3.5 - 5.3 mmol/L 4.4  4.4  4.2   Chloride 98 - 110 mmol/L 102  100  102   CO2 20 - 32 mmol/L 26  29  27    Calcium  8.6 - 10.4 mg/dL 9.7  89.3  89.7   Total Protein 6.1 - 8.1 g/dL 7.4  7.5  7.3   Total Bilirubin 0.2 - 1.2 mg/dL 0.4  0.4  0.4   AST 10 - 35 U/L 14  15  17    ALT 6 - 29 U/L 17  18  17      Lab Results  Component Value Date   CHOL 149 03/28/2023   HDL 38 (L) 03/28/2023   LDLCALC 83 03/28/2023   TRIG 189 (H) 03/28/2023   CHOLHDL 3.9 03/28/2023   No results for input(s): LIPOA in the last 8760 hours. No components found for: NTPROBNP No results for input(s): PROBNP in the last 8760 hours. No results for input(s): TSH in the last 8760 hours.  External Labs: Collected: December 26, 2023 [facility] database. Total cholesterol 153, triglycerides 204, HDL 36, LDL 82, A1c 6.4%. Hemoglobin 12.8. Potassium 4.8. Magnesium 2.1  Physical Exam:    Today's Vitals   04/15/24 1330  BP: (!) 185/77  Pulse: 69  Resp: 16  SpO2: 98%  Weight: 137 lb 6.4 oz (62.3 kg)  Height: 5' 1 (1.549 m)   Body mass index is 25.96 kg/m. Wt Readings from Last 3 Encounters:  04/15/24 137 lb 6.4 oz (62.3 kg)  04/09/24 139 lb (63 kg)  04/09/23 146 lb (66.2 kg)    Physical Exam  Constitutional: No distress.  hemodynamically stable  Neck: No JVD  present.  Cardiovascular: Normal rate, regular rhythm, S1 normal and S2 normal. Exam reveals no gallop, no S3 and no S4.  No murmur heard. Pulses:      Carotid pulses are  on the right side with bruit and  on the left side with bruit. Pulmonary/Chest: Effort normal and breath sounds normal. No stridor. She has no wheezes. She has no rales.  Musculoskeletal:        General: No edema.     Cervical back: Neck supple.  Skin: Skin is warm.     Impression & Recommendation(s):  Impression:   ICD-10-CM   1. Precordial pain  R07.2 EKG 12-Lead    Lipid panel    Comprehensive metabolic panel with GFR    Lipoprotein A (LPA)    ECHOCARDIOGRAM COMPLETE    CT CORONARY MORPH W/CTA COR W/SCORE W/CA W/CM &/OR WO/CM    aspirin EC 81 MG tablet    2. Atherosclerosis of both carotid arteries  I65.23 Lipid panel    Comprehensive metabolic panel with GFR    Lipoprotein A (LPA)    CT ANGIO HEAD NECK W WO CM    3. Non-insulin  dependent type 2 diabetes mellitus (HCC)  E11.9     4. Hyperlipidemia associated with type 2 diabetes mellitus (HCC)  E11.69 rosuvastatin  (CRESTOR ) 20 MG tablet   E78.5 Lipid panel    Comprehensive metabolic panel with GFR    Lipoprotein A (LPA)    5. Pure hypertriglyceridemia  E78.1 rosuvastatin  (CRESTOR ) 20 MG tablet    Lipid panel  Comprehensive metabolic panel with GFR    Lipoprotein A (LPA)    6. Benign hypertension  I10        Recommendation(s):  Precordial pain Chest pain suggestive of cardiac discomfort. No active pain. EKG: Sinus rhythm with ST-T changes concerning for anterolateral and inferior ischemia, new when compared to prior tracing. Echo will be ordered to evaluate for structural heart disease and left ventricular systolic function. Coronary CTA to evaluate for CAC, plaque burden, and obstructive disease Since the findings of the carotid duplex in August 2025 patient's Crestor  dose was increased from 10 to 20 mg p.o. daily. Check fasting lipids,  CMP, and LP(a) for further risk stratification Start aspirin 81 mg p.o. daily. Offered prescriptions for sublingual nitroglycerin tablets to use on as needed basis; however, patient wanted to hold off. Advised the patient to hold off on overexerting/exercising until the workup is complete Educated her on seeking medical attention sooner by going to the closest ER via EMS if the symptoms increase in intensity, frequency, duration, or has typical chest pain as discussed in the office.  Patient verbalized understanding.   Atherosclerosis of both carotid arteries Bilateral carotid bruits left greater than right Had a carotid duplex with PCP, will request records but patient was able to share them with me on her smart phone during the office visit.  In summary left ICA disease reported to be 70-99% and right ICA disease 50 to 69%. We discussed the role of vascular surgery consultation as well as further imaging.   Patient would like to proceed with further imaging to verify the results prior to seeing vascular surgery. Will order CTA head and neck. Antiplatelets and lipid-lowering agents as mentioned above  Non-insulin  dependent type 2 diabetes mellitus (HCC) Reemphasize importance of glycemic control.  Hyperlipidemia associated with type 2 diabetes mellitus (HCC) Pure hypertriglyceridemia Recommended goal LDL at least <70 mg/dL, further recommendations to follow Recommended goal triglyceride levels <149 mg/dL Currently on Crestor  20 mg p.o. nightly. Will reevaluate lipids prior to uptitration of medical therapy  Benign hypertension Office blood pressures are uncontrolled. Home blood pressure log reviewed, home numbers are better controlled. Given underlying carotid disease do not want to overcorrect her blood pressures at this time. Patient is asked to seek medical attention if her systolic blood pressures are consistently >140 mmHg   Orders Placed:  Orders Placed This Encounter   Procedures   CT CORONARY MORPH W/CTA COR W/SCORE W/CA W/CM &/OR WO/CM    Standing Status:   Future    Expiration Date:   04/15/2025    Scheduling Instructions:     Please do this CCTA  before  CT of Head an Neck    If indicated for the ordered procedure, I authorize the administration of contrast media per Radiology protocol:   Yes    [facility]    Is patient pregnant?:   No   CT ANGIO HEAD NECK W WO CM    Standing Status:   Future    Expiration Date:   04/15/2025    Scheduling Instructions:     Schedule after CCTA , And Echo    If indicated for the ordered procedure, I authorize the administration of contrast media per Radiology protocol:   Yes    Does the patient have a contrast media/X-ray dye allergy?:   No    Is patient pregnant?:   No    Preferred imaging location?:   [facility]  Lipid panel    Has the patient fasted?:   Yes   Comprehensive metabolic panel with GFR    Has the patient fasted?:   Yes   Lipoprotein A (LPA)   EKG 12-Lead   ECHOCARDIOGRAM COMPLETE    Standing Status:   Future    Expiration Date:   04/15/2025    Where [facility] this test [facility] performed[facility]    Does the patient weigh less than or greater than 250 lbs?:   Patient weighs less than 250 lbs    Perflutren DEFINITY (image enhancing agent) should be administered unless hypersensitivity or allergy exist:   Administer Perflutren    Reason for exam-Echo:   Other-Full Diagnosis List    Full ICD-10/Reason for Exam:   Precordial chest pain [813930]     Final Medication List:    Meds ordered this encounter  Medications   DISCONTD: metoprolol tartrate (LOPRESSOR) 100 MG tablet    Sig: Take 1 tablet (100 mg total) by mouth once for 1 dose. TAKE TWO HOURS PRIOR TO  SCHEDULE CARDIAC TEST    Dispense:  1 tablet    Refill:  0   aspirin EC 81 MG tablet    Sig: Take 1 tablet (81 mg total) by mouth daily. Swallow whole.    Medications  Discontinued During This Encounter  Medication Reason   atenolol  (TENORMIN ) 100 MG tablet Patient Preference   levothyroxine  (SYNTHROID ) 112 MCG tablet Dose change   ketoconazole (NIZORAL) 2 % cream Completed Course   rosuvastatin  (CRESTOR ) 10 MG tablet Dose change   metoprolol tartrate (LOPRESSOR) 100 MG tablet Discontinued by provider     Current Outpatient Medications:    acetaminophen (TYLENOL) 500 MG tablet, Take 500 mg by mouth every 6 (six) hours as needed., Disp: , Rfl:    ALPRAZolam  (XANAX ) 0.5 MG tablet, 1 tablet Orally Twice a day As needed, Disp: , Rfl:    aspirin EC 81 MG tablet, Take 1 tablet (81 mg total) by mouth daily. Swallow whole., Disp: , Rfl:    carvedilol (COREG) 3.125 MG tablet, Take 3.125 mg by mouth 2 (two) times daily., Disp: , Rfl:    Cholecalciferol 125 MCG (5000 UT) capsule, Take 5,000 Units by mouth daily., Disp: , Rfl:    fenofibrate  micronized (LOFIBRA) 134 MG capsule, TAKE 1 CAPSULE DAILY FOR TRIGLYCERIDES ( BLOOD FATS ), Disp: 90 capsule, Rfl: 3   hydrochlorothiazide  (HYDRODIURIL ) 25 MG tablet, TAKE 1 TABLET BY MOUTH EVERY MORNING FOR BLOOD PRESSURE AND FLUID RETENTION, Disp: 90 tablet, Rfl: 3   Lancets (ONETOUCH DELICA PLUS LANCET30G) MISC, USE TO TEST BLOOD SUGAR ONCE DAILY, Disp: 100 each, Rfl: 11   levothyroxine  (SYNTHROID ) 100 MCG tablet, 1 tablet in the morning on an empty stomach Orally Once a day; Duration: 90 days, Disp: , Rfl:    metFORMIN  (GLUCOPHAGE -XR) 500 MG 24 hr tablet, Take  2 tablets  2 x /day  with Meals  for Diabetes, Disp: 360 tablet, Rfl: 3   olmesartan  (BENICAR ) 40 MG tablet, TAKE 1 TABLET EVERY NIGHT FOR BP & DIABETIC KIDNEY PROTECTION, Disp: 90 tablet, Rfl: 3   ONETOUCH VERIO test strip, CHECK BLOOD SUGAR DAILY ( DX: E.11 ), Disp: 100 strip, Rfl: 3   rosuvastatin  (CRESTOR ) 20 MG tablet, Take 20 mg by mouth daily., Disp: , Rfl:   Consent:   N/A  Disposition:   4-week follow-up sooner if needed  Her questions and concerns were  addressed to her satisfaction. She  voices understanding of the recommendations provided during this encounter.   Medical decision making: High-chest pain concerning for cardiac discomfort and notable carotid artery disease EKG independently reviewed 04/15/2024 Labs independently reviewed from [facility] database, December 26, 2023 Ordered coronary CTA, echocardiogram, CTA head and neck Prescription drug management Educated both patient and husband to seek medical attention by going to the closest ER if she has reoccurrence of chest pain for more expedited evaluation.  They verbalized understanding.  Signed, Madonna Michele HAS, [facility]: [facility]  A Division of [facility] [address] [place], [place] [address]  04/15/2024 6:56 PM

## 2024-04-15 NOTE — Patient Instructions (Addendum)
 Medication Instructions:    *If you need a refill on your cardiac medications before your next appointment, please call your pharmacy*   Lab Work: LIPID CMP Lpa If you have labs (blood work) drawn today and your tests are completely normal, you will receive your results only by: MyChart Message (if you have MyChart) OR A paper copy in the mail If you have any lab test that is abnormal or we need to change your treatment, we will call you to review the results.   Testing/Procedures:  1)  Your physician has requested that you have coronary  CTA. Coronary computed tomography (CT)angiogram  is a special type of CT scan that uses a computer to produce multi-dimensional views of major blood vessels throughout the heart.  CT angiography, a contrast material is injected through an IV to help visualize the blood vessels  a painless test that uses an x-ray machine to take clear, detailed pictures of your heart arteries .  Please follow instruction sheet as given.   2)Non-Cardiac CT Angiography (CTA), is a special type of CT scan that uses a computer to produce multi-dimensional views of major blood vessels ( head and,neck). In CT angiography, a contrast material is injected through an IV to help visualize the blood vessels    Follow-Up: At [facility], you and your health needs are our priority.  As part of our continuing mission to provide you with exceptional heart care, we have created designated Provider Care Teams.  These Care Teams include your primary Cardiologist (physician) and Advanced Practice Providers (APPs -  Physician Assistants and Nurse Practitioners) who all work together to provide you with the care you need, when you need it.     Your next appointment:    4 to 6 week(s)  The format for your next appointment:   In Person  Provider:   Madonna Large, DO   Other Instructions    Your cardiac CT will be scheduled at one of the below locations:      [facility] [address]  [place], [place] [address]      If scheduled at the [facility] at [address], please enter the parking lot using the [address] entrance and use the FREE valet service at the patient drop-off area. Enter the building and check-in with registration on the main floor.     Please follow these instructions carefully (unless otherwise directed):  An IV will be required for this test and Nitroglycerin will be given.    On the Night Before the Test: Be sure to Drink plenty of water. Do not consume any caffeinated/decaffeinated beverages or chocolate 12 hours prior to your test. Do not take any antihistamines 12 hours prior to your test.    On the Day of the Test: Drink plenty of water until 1 hour prior to the test. Do not eat any food 1 hour prior to test. You may take your regular medications prior to the test.  If you take Hydrochlorothiazide , please HOLD on the morning of the test. Patients who wear a continuous glucose monitor MUST remove the device prior to scanning. FEMALES- please wear underwire-free bra if available, avoid dresses & tight clothing       After the Test: Drink plenty of water. After receiving IV contrast, you may experience a mild flushed feeling. This is normal. On occasion, you may experience a mild rash up to 24 hours after the test. This is  not dangerous. If this occurs, you can take Benadryl 25 mg, Zyrtec, Claritin, or Allegra and increase your fluid intake. (Patients taking Tikosyn should avoid Benadryl, and may take Zyrtec, Claritin, or Allegra) If you experience trouble breathing, this can be serious. If it is severe call 911 IMMEDIATELY. If it is mild, please call our office.  We will call to schedule your test 2-4 weeks out understanding that some insurance companies will need an authorization prior to the service being performed.   For more information and frequently asked questions,  please visit our website : [email or web address]  For non-scheduling related questions, please contact the cardiac imaging nurse navigator should you have any questions/concerns: Cardiac Imaging Nurse Navigators Direct Office Dial: [phone]   For scheduling needs, including cancellations and rescheduling, please call Brittany, [phone].

## 2024-04-17 ENCOUNTER — Ambulatory Visit: Payer: Self-pay | Admitting: Cardiology

## 2024-04-17 DIAGNOSIS — E1169 Type 2 diabetes mellitus with other specified complication: Secondary | ICD-10-CM

## 2024-04-17 DIAGNOSIS — I2510 Atherosclerotic heart disease of native coronary artery without angina pectoris: Secondary | ICD-10-CM

## 2024-04-17 LAB — LIPID PANEL
Chol/HDL Ratio: 4.3 ratio (ref 0.0–4.4)
Cholesterol, Total: 153 mg/dL (ref 100–199)
HDL: 36 mg/dL — ABNORMAL LOW (ref >39)
LDL Chol Calc (NIH): 89 mg/dL (ref 0–99)
Triglycerides: 162 mg/dL — ABNORMAL HIGH (ref 0–149)
VLDL Cholesterol Cal: 28 mg/dL (ref 5–40)

## 2024-04-17 LAB — COMPREHENSIVE METABOLIC PANEL WITH GFR
ALT: 20 IU/L (ref 0–32)
AST: 20 IU/L (ref 0–40)
Albumin: 4.6 g/dL (ref 3.8–4.9)
Alkaline Phosphatase: 35 IU/L — ABNORMAL LOW (ref 49–135)
BUN/Creatinine Ratio: 17 (ref 9–23)
BUN: 21 mg/dL (ref 6–24)
Bilirubin Total: 0.4 mg/dL (ref 0.0–1.2)
CO2: 24 mmol/L (ref 20–29)
Calcium: 9.7 mg/dL (ref 8.7–10.2)
Chloride: 98 mmol/L (ref 96–106)
Creatinine, Ser: 1.24 mg/dL — ABNORMAL HIGH (ref 0.57–1.00)
Globulin, Total: 2.8 g/dL (ref 1.5–4.5)
Glucose: 132 mg/dL — ABNORMAL HIGH (ref 70–99)
Potassium: 4.6 mmol/L (ref 3.5–5.2)
Sodium: 136 mmol/L (ref 134–144)
Total Protein: 7.4 g/dL (ref 6.0–8.5)
eGFR: 50 mL/min/1.73 — ABNORMAL LOW (ref >59)

## 2024-04-17 LAB — LIPOPROTEIN A (LPA): Lipoprotein (a): <8.4 nmol/L (ref <75.0)

## 2024-04-17 NOTE — Progress Notes (Signed)
 Called the patient to discuss labs and change in medications.  However she was going to a funeral and could not talk at this time.  She wants us  to call her back closer to 3 PM today.  Please inform the patient: Triglycerides and LDL levels are not at goal. Increase Crestor  to 40 mg p.o. nightly, given her diabetes goal LDL <70 mg/dL. If on follow-up labs triglycerides are still not well-controlled we will make additional adjustments. Focus on reducing foods that are high in cholesterol and carbohydrate content LP(a) pending Please order CMP and fasting lipids in 6 weeks.  On a separate note her coronary CTA is not scheduled until the first of the year.  Please encourage her to get it done sooner. I have asked the CT staff to reach out to her as well.  Dr. Afra Tricarico

## 2024-04-18 ENCOUNTER — Telehealth: Payer: Self-pay

## 2024-04-18 ENCOUNTER — Telehealth: Payer: Self-pay | Admitting: *Deleted

## 2024-04-18 ENCOUNTER — Encounter (HOSPITAL_COMMUNITY): Payer: Self-pay

## 2024-04-18 NOTE — Telephone Encounter (Signed)
 Spoke with patient and relayed Dr. Tyree message to take 2 (two) tablets of Carvedilol, 3 hours prior to her CT scan

## 2024-04-18 NOTE — Telephone Encounter (Signed)
 Spoke to patient she is aware to coreg instead of Metoprolol   for CCTA.  Patient states she did not pick the metoprolol  . CCTa has been moved up to 04/22/24

## 2024-04-18 NOTE — Telephone Encounter (Signed)
-----   Message from [facility] sent at 04/15/2024  6:54 PM EST ----- Regarding: CCTA - medication error Savaya,   This patient is scheduled for a CCTA. We could place an order for Lopressor  100 mg to take prior to her coronary CTA. However, this was not needed as she is already on carvedilol and her heart rate was 60s at today's visit. I discontinued the Lopressor  prescription but not sure if it was picked up by the pharmacy.   Please follow up with pharmacy and make sure final recommendations are conveyed to the patient.   Reach out if you have any questions or concerns  ST

## 2024-04-18 NOTE — Telephone Encounter (Signed)
-----   Message from [facility] sent at 04/17/2024 11:52 AM EST ----- Called the patient to discuss labs and change in medications.  However she was going to a funeral and could not talk at this time.  She wants us  to call her back closer to 3 PM today.  Please inform the patient: Triglycerides and LDL levels are not at goal. Increase Crestor  to 40 mg p.o. nightly, given her diabetes goal LDL <70 mg/dL. If on follow-up labs triglycerides are still not well-controlled we will make additional adjustments. Focus on reducing foods that are high in cholesterol and carbohydrate content LP(a) pending Please order CMP and fasting lipids in 6 weeks.  On a separate note her coronary CTA is not scheduled until the first of the year.  Please encourage her to get it done sooner. I have asked the CT staff to reach out to her as well.  Dr. Michele ----- Message ----- From: Interface, [facility] Lab Results In Sent: 04/16/2024  10:41 PM EST To: Madonna Michele, DO

## 2024-04-18 NOTE — Telephone Encounter (Signed)
 Spoke with patient. Relayed Dr. Tyree note. Patient acknowledged understanding to change dose of Crestor , keep Echocardiogram and CT appointments and have labs drawn before follow-up with Dr. Michele.

## 2024-04-22 ENCOUNTER — Ambulatory Visit (HOSPITAL_COMMUNITY)
Admission: RE | Admit: 2024-04-22 | Discharge: 2024-04-22 | Disposition: A | Source: Ambulatory Visit | Attending: Cardiology

## 2024-04-22 ENCOUNTER — Ambulatory Visit: Payer: Self-pay | Admitting: Cardiology

## 2024-04-22 ENCOUNTER — Other Ambulatory Visit: Payer: Self-pay | Admitting: Cardiology

## 2024-04-22 DIAGNOSIS — R072 Precordial pain: Secondary | ICD-10-CM | POA: Diagnosis not present

## 2024-04-22 DIAGNOSIS — R931 Abnormal findings on diagnostic imaging of heart and coronary circulation: Secondary | ICD-10-CM

## 2024-04-22 DIAGNOSIS — I2510 Atherosclerotic heart disease of native coronary artery without angina pectoris: Secondary | ICD-10-CM | POA: Diagnosis not present

## 2024-04-22 MED ORDER — IOHEXOL 350 MG/ML SOLN
100.0000 mL | Freq: Once | INTRAVENOUS | Status: AC | PRN
  Administered 2024-04-22: 100 mL via INTRAVENOUS

## 2024-04-22 MED ORDER — NITROGLYCERIN 0.4 MG SL SUBL
0.8000 mg | SUBLINGUAL_TABLET | Freq: Once | SUBLINGUAL | Status: AC
  Administered 2024-04-22: 0.8 mg via SUBLINGUAL

## 2024-04-22 MED ORDER — NITROGLYCERIN 0.4 MG SL SUBL: 0.4000 mg | SUBLINGUAL_TABLET | SUBLINGUAL | 0 refills | Status: AC | PRN

## 2024-04-22 NOTE — Addendum Note (Signed)
 Addended by: MICHELE MADONNA GRADE on: 04/22/2024 02:28 PM   Modules accepted: Orders

## 2024-04-22 NOTE — Progress Notes (Signed)
 Spoke to the patient over the phone and reviewed the coronary CTA and FFR results. Patient is informed that I am concerned she has multivessel CAD that is hemodynamically significant and needs further evaluation. Patient informs me she is not having active chest pain or shortness of breath since the last office visit. Patient is advised to go to the closest ER via EMS if she has new onset of chest pain or shortness of breath.  Patient verbalizes understanding and provides verbal feedback She is on aspirin  and statin therapy She is agreeable to sending in a prescription for nitro if needed.  Instructions given. She she uses nitroglycerin  she needs to go to ER via EMS given her anatomy.  Recommended proceeding forward with left heart catheterization; however, patient remains hesitant. Recommended her to come in tomorrow to review the images and answer any questions that she may have.  Encouraged her to bring her husband as well. In the interim if she has symptoms she will go to the ER. Patient is informed not to overexert her self in meantime.  Dr. Michele Madura,  I sent in nitroglycerin  to her pharmacy  Make her an appt tomorrow (A must) can be double booked - ideally at lunch.   Dr. Lorriann Hansmann

## 2024-04-23 ENCOUNTER — Encounter (HOSPITAL_COMMUNITY)

## 2024-04-23 ENCOUNTER — Ambulatory Visit: Attending: Cardiology | Admitting: Cardiology

## 2024-04-23 ENCOUNTER — Encounter: Payer: Self-pay | Admitting: Cardiology

## 2024-04-23 ENCOUNTER — Encounter (HOSPITAL_COMMUNITY): Payer: Self-pay

## 2024-04-23 ENCOUNTER — Other Ambulatory Visit: Payer: Self-pay | Admitting: *Deleted

## 2024-04-23 VITALS — BP 180/75 | HR 85 | Resp 17 | Ht 61.0 in | Wt 136.0 lb

## 2024-04-23 DIAGNOSIS — I6523 Occlusion and stenosis of bilateral carotid arteries: Secondary | ICD-10-CM | POA: Diagnosis not present

## 2024-04-23 DIAGNOSIS — E119 Type 2 diabetes mellitus without complications: Secondary | ICD-10-CM

## 2024-04-23 DIAGNOSIS — E781 Pure hyperglyceridemia: Secondary | ICD-10-CM | POA: Diagnosis not present

## 2024-04-23 DIAGNOSIS — I2510 Atherosclerotic heart disease of native coronary artery without angina pectoris: Secondary | ICD-10-CM

## 2024-04-23 DIAGNOSIS — I10 Essential (primary) hypertension: Secondary | ICD-10-CM | POA: Diagnosis not present

## 2024-04-23 DIAGNOSIS — I25118 Atherosclerotic heart disease of native coronary artery with other forms of angina pectoris: Secondary | ICD-10-CM | POA: Diagnosis not present

## 2024-04-23 DIAGNOSIS — I209 Angina pectoris, unspecified: Secondary | ICD-10-CM

## 2024-04-23 LAB — CBC
Hematocrit: 38 % (ref 34.0–46.6)
Hemoglobin: 12.3 g/dL (ref 11.1–15.9)
MCH: 28.1 pg (ref 26.6–33.0)
MCHC: 32.4 g/dL (ref 31.5–35.7)
MCV: 87 fL (ref 79–97)
Platelets: 318 x10E3/uL (ref 150–450)
RBC: 4.38 x10E6/uL (ref 3.77–5.28)
RDW: 12.6 % (ref 11.7–15.4)
WBC: 9.1 x10E3/uL (ref 3.4–10.8)

## 2024-04-23 MED ORDER — ROSUVASTATIN CALCIUM 40 MG PO TABS: 40.0000 mg | ORAL_TABLET | Freq: Every day | ORAL | 3 refills | Status: AC

## 2024-04-23 MED ORDER — ISOSORBIDE MONONITRATE ER 30 MG PO TB24: 30.0000 mg | ORAL_TABLET | Freq: Every day | ORAL | 3 refills | Status: DC

## 2024-04-23 NOTE — Patient Instructions (Signed)
 Medication Instructions:   INCREASE rosuvastatin  to 40mg  daily  START isosorbide mononitrate to 30mg  daily   *If you need a refill on your cardiac medications before your next appointment, please call your pharmacy*  Lab Work:  CBC today -- first floor  If you have labs (blood work) drawn today and your tests are completely normal, you will receive your results only by: MyChart Message (if you have MyChart) OR A paper copy in the mail If you have any lab test that is abnormal or we need to change your treatment, we will call you to review the results.  Testing/Procedures:  SCHEDULE echo sooner    Follow-Up: At [facility], you and your health needs are our priority.  As part of our continuing mission to provide you with exceptional heart care, our providers are all part of one team.  This team includes your primary Cardiologist (physician) and Advanced Practice Providers or APPs (Physician Assistants and Nurse Practitioners) who all work together to provide you with the care you need, when you need it.  Your next appointment:    AS SCHEDULED 05/23/24 with Dr. Michele  We recommend signing up for the patient portal called MyChart.  Sign up information is provided on this After Visit Summary.  MyChart is used to connect with patients for Virtual Visits (Telemedicine).  Patients are able to view lab/test results, encounter notes, upcoming appointments, etc.  Non-urgent messages can be sent to your provider as well.   To learn more about what you can do with MyChart, go to [email or web address].   Other Instructions   [facility] A DEPT OF [facility] AT [address] A DEPT OF THE . [facility] [address] [place] [address] Dept: [phone] Loc: [phone]  Allison Contreras  04/23/2024  You are scheduled for a Cardiac Catheterization on Friday, December 19 with Dr. Peter [name].  1. Please arrive at the [facility] (Main Entrance A) at [facility]: [address] [place], [place] [address] at 7:00 AM (This time is 2 hour(s) before your procedure to ensure your preparation).   Free valet parking service is available. You will check in at ADMITTING. The support person will be asked to wait in the waiting room.  It is OK to have someone drop you off and come back when you are ready to be discharged.    Special note: Every effort is made to have your procedure done on time. Please understand that emergencies sometimes delay scheduled procedures.  2. Diet: Nothing to eat after midnight.   3. Hydration: You need to be well hydrated before your procedure. On December 19, you may drink approved liquids (see below) until 2 hours before the procedure, with 16 oz of water as your last intake.   List of approved liquids water, clear juice, clear tea, black coffee, fruit juices, non-citric and without pulp, carbonated beverages, Gatorade, Kool -Aid, plain Jello-O and plain ice popsicles.  4. Labs: You will need to have blood drawn TODAY (CBC). CMET already completed  5. Medication instructions in preparation for your procedure:  Do not take Diabetes Med Glucophage  (Metformin ) on the day of the procedure and HOLD 48 HOURS AFTER THE PROCEDURE.  On the morning of your procedure, take your Aspirin  81 mg and any morning medicines NOT listed above.  You may use sips of water.  6. Plan to go home the same day, you will only stay overnight  if medically necessary. 7. Bring a current list of your medications and current insurance cards. 8. You MUST have a responsible person to drive you home. 9. Someone MUST be with you the first 24 hours after you arrive home or your discharge will be delayed. 10. Please wear clothes that are easy to get on and off and wear slip-on shoes.  Thank you for allowing us  to care for you!   -- [facility]

## 2024-04-23 NOTE — Progress Notes (Signed)
 Cardiology Office Note:    Date:  04/23/2024  NAME:  Allison Contreras    MRN: [id number] DOB:  1966-01-11   PCP:  Stacia Millman, PA  Former Cardiology Providers: None Primary Cardiologist:  Madonna Large, DO, [facility] (established care 04/15/2024) Electrophysiologist:  None   Chief Complaint  Patient presents with   Follow-up    CAD and review test results     History of Present Illness:    Allison Contreras is a 58 y.o. Caucasian female whose past medical history and cardiovascular risk factors includes: CAD, Carotid artery disease, diabetes mellitus type 2, hypertension, hyperlipidemia, hypertriglyceridemia hypothyroidism.  Patient is referred to the practice for evaluation of coronary artery disease and angina.  Her symptoms are concerning for angina pectoris at the last office visit.  Her EKG is also noted ST-T changes in the anterolateral and inferior leads suggestive of ischemia.  Recommended coronary angiography due to concerns for multivessel CAD based on symptoms and EKG; however, patient was reluctant and wanted to pursue forward with noninvasive testing.  Coronary CTA was performed and completed yesterday which illustrates multivessel CAD.  I called her yesterday to go over the results and recommended to proceed forward with angiography.  Patient still remains hesitant and therefore she was asked to come in today to reevaluate symptoms and to review images and answer questions and concerns that she may have.  Echocardiogram is tentatively scheduled for May 16, 2024. CTA head and neck to evaluate for carotid disease scheduled for May 21, 2024.  Patient is accompanied by her husband Scott at today's office visit.  Since last office visit labs have noted elevated triglycerides as well as LDL.  Crestor  was increased to 40 mg p.o. nightly.  Shared decision was if follow-up labs did not notice improvement in triglycerides pharmacological therapy could be introduced.  Till  then she will focus on lifestyle modifications as well.  Given her coronary CT findings she was advised to come in today as she was worked in to discuss her results and reevaluate symptoms.   Patient denies chest pain or heart failure symptoms. Office blood pressures are significantly high, patient states that she is very anxious. Home blood pressures are >140 mmHg.  Current Medications: Current Meds  Medication Sig   acetaminophen (TYLENOL) 500 MG tablet Take 500 mg by mouth every 6 (six) hours as needed.   ALPRAZolam  (XANAX ) 0.5 MG tablet 1 tablet Orally Twice a day As needed   aspirin  EC 81 MG tablet Take 1 tablet (81 mg total) by mouth daily. Swallow whole.   carvedilol (COREG) 3.125 MG tablet Take 3.125 mg by mouth 2 (two) times daily.   Cholecalciferol 125 MCG (5000 UT) capsule Take 5,000 Units by mouth daily.   fenofibrate  micronized (LOFIBRA) 134 MG capsule TAKE 1 CAPSULE DAILY FOR TRIGLYCERIDES ( BLOOD FATS )   hydrochlorothiazide  (HYDRODIURIL ) 25 MG tablet TAKE 1 TABLET BY MOUTH EVERY MORNING FOR BLOOD PRESSURE AND FLUID RETENTION   isosorbide mononitrate (IMDUR) 30 MG 24 hr tablet Take 1 tablet (30 mg total) by mouth daily.   Lancets (ONETOUCH DELICA PLUS LANCET30G) MISC USE TO TEST BLOOD SUGAR ONCE DAILY   levothyroxine  (SYNTHROID ) 100 MCG tablet 1 tablet in the morning on an empty stomach Orally Once a day; Duration: 90 days   metFORMIN  (GLUCOPHAGE -XR) 500 MG 24 hr tablet Take  2 tablets  2 x /day  with Meals  for Diabetes   nitroGLYCERIN  (NITROSTAT ) 0.4 MG SL tablet Place 1 tablet (  0.4 mg total) under the tongue every 5 (five) minutes as needed for chest pain. If you require more than two tablets five minutes apart go to the nearest ER via EMS.   olmesartan  (BENICAR ) 40 MG tablet TAKE 1 TABLET EVERY NIGHT FOR BP & DIABETIC KIDNEY PROTECTION   ONETOUCH VERIO test strip CHECK BLOOD SUGAR DAILY ( DX: E.11 )   rosuvastatin  (CRESTOR ) 40 MG tablet Take 1 tablet (40 mg total) by  mouth daily.   [DISCONTINUED] rosuvastatin  (CRESTOR ) 20 MG tablet Take 20 mg by mouth daily.     Allergies:    Codeine, Ppd [tuberculin purified protein derivative], and Lisinopril    Past Medical History: Past Medical History:  Diagnosis Date   Chest pain    Hypertension    Hypothyroidism    Splenomegaly 02/14/2019   02/14/2019 abd US : Spleen measures 14.1 x 15.6 x 4.7 cm with a measured splenic volume of 542 cubic cm. No focal splenic lesions are evident.    Past Surgical History: Past Surgical History:  Procedure Laterality Date   LYMPH NODE DISSECTION     NECK- BENIGN    Social History: Social History   Tobacco Use   Smoking status: Never    Passive exposure: Past   Smokeless tobacco: Never  Vaping Use   Vaping status: Never Used  Substance Use Topics   Alcohol use: Yes    Comment: occ.   Drug use: No    Family History: Family History  Problem Relation Age of Onset   Cancer Mother        NON HODGKINS LYMPHOMA   Hypertension Mother    Diabetes Mother    Hyperlipidemia Mother    Hypertension Father    Heart disease Father    Breast cancer Neg Hx     ROS:   Review of Systems  Cardiovascular:  Negative for chest pain, claudication, irregular heartbeat, leg swelling, near-syncope, orthopnea, palpitations, paroxysmal nocturnal dyspnea and syncope.  Respiratory:  Negative for shortness of breath.   Hematologic/Lymphatic: Negative for bleeding problem.    EKGs/Labs/Other Studies Reviewed:    Echocardiogram: Will order  Coronary CTA: 04/22/2024 1. Coronary calcium  score of 683. This was 99th percentile for age and sex matched control.   2. Normal coronary origins with co-dominance.   3. CAD-RADS 4 Severe coronary artery disease.   4. Severe stenosis (70-99%) at proximal LAD due to calcified plaque. Severe stenosis (70-99%) at proximal/mid LAD due to mixed but predominantly calcified plaque.   5. Severe stenosis (70-99%) due to mixed plaque at mid  LCX   6. Severe stenosis (70-99%) due to mixed plaque from proximal to proximal/mid RCA.   7. CT FFR will be performed and reported separately due to concern for multivessel CAD.   8. Aortic atherosclerosis.   9. Patent foramen ovale.  10. CT FFR analysis showed significant multivessel CAD in LAD, RCA,and LCx.  11.  Radiology overread pending     Carotid duplex Performed at [facility] physicians, results provided with the patient on her smart phone Left ICA proximal 70-99%. Right ICA proximal 50-69% Patent bilateral vertebral arteries  Labs:    Latest Ref Rng & Units 03/28/2023    9:37 AM 12/26/2022    3:03 PM 09/19/2022   11:03 AM  CBC  WBC 3.8 - 10.8 Thousand/uL 9.3  9.2  8.2   Hemoglobin 11.7 - 15.5 g/dL 87.2  87.0  87.3   Hematocrit 35.0 - 45.0 % 39.6  39.3  38.2   Platelets 140 -  400 Thousand/uL 285  305  264        Latest Ref Rng & Units 04/16/2024    8:09 AM 03/28/2023    9:37 AM 12/26/2022    3:03 PM  BMP  Glucose 70 - 99 mg/dL 867  867  96   BUN 6 - 24 mg/dL 21  16  18    Creatinine 0.57 - 1.00 mg/dL 8.75  8.94  8.96   BUN/Creat Ratio 9 - 23 17  15   SEE NOTE:   Sodium 134 - 144 mmol/L 136  138  138   Potassium 3.5 - 5.2 mmol/L 4.6  4.4  4.4   Chloride 96 - 106 mmol/L 98  102  100   CO2 20 - 29 mmol/L 24  26  29    Calcium  8.7 - 10.2 mg/dL 9.7  9.7  89.3       Latest Ref Rng & Units 04/16/2024    8:09 AM 03/28/2023    9:37 AM 12/26/2022    3:03 PM  CMP  Glucose 70 - 99 mg/dL 867  867  96   BUN 6 - 24 mg/dL 21  16  18    Creatinine 0.57 - 1.00 mg/dL 8.75  8.94  8.96   Sodium 134 - 144 mmol/L 136  138  138   Potassium 3.5 - 5.2 mmol/L 4.6  4.4  4.4   Chloride 96 - 106 mmol/L 98  102  100   CO2 20 - 29 mmol/L 24  26  29    Calcium  8.7 - 10.2 mg/dL 9.7  9.7  89.3   Total Protein 6.0 - 8.5 g/dL 7.4  7.4  7.5   Total Bilirubin 0.0 - 1.2 mg/dL 0.4  0.4  0.4   Alkaline Phos 49 - 135 IU/L 35     AST 0 - 40 IU/L 20  14  15    ALT 0 - 32 IU/L 20  17  18      Lab  Results  Component Value Date   CHOL 153 04/16/2024   HDL 36 (L) 04/16/2024   LDLCALC 89 04/16/2024   TRIG 162 (H) 04/16/2024   CHOLHDL 4.3 04/16/2024   Recent Labs    04/16/24 0809  LIPOA <8.4   No components found for: NTPROBNP No results for input(s): PROBNP in the last 8760 hours. No results for input(s): TSH in the last 8760 hours.  External Labs: Collected: December 26, 2023 [facility] database. Total cholesterol 153, triglycerides 204, HDL 36, LDL 82, A1c 6.4%. Hemoglobin 12.8. Potassium 4.8. Magnesium 2.1  Physical Exam:    Today's Vitals   04/23/24 1143 04/23/24 1144 04/23/24 1249  BP: (!) 191/84 (!) 203/77 (!) 180/75  Pulse: 83 79 85  Resp: 17    SpO2: 99%    Weight: 136 lb (61.7 kg)    Height: 5' 1 (1.549 m)     Body mass index is 25.7 kg/m. Wt Readings from Last 3 Encounters:  04/23/24 136 lb (61.7 kg)  04/15/24 137 lb 6.4 oz (62.3 kg)  04/09/24 139 lb (63 kg)    Physical Exam  Constitutional: No distress.  hemodynamically stable  Neck: No JVD present.  Cardiovascular: Normal rate, regular rhythm, S1 normal and S2 normal. Exam reveals no gallop, no S3 and no S4.  No murmur heard. Pulses:      Carotid pulses are  on the right side with bruit and  on the left side with bruit. Pulmonary/Chest: Effort normal and breath sounds normal. No stridor.  She has no wheezes. She has no rales.  Musculoskeletal:        General: No edema.     Cervical back: Neck supple.  Skin: Skin is warm.     Impression & Recommendation(s):  Impression:   ICD-10-CM   1. Angina pectoris  I20.9 CBC    LEFT HEART CATH    2. Atherosclerosis of native coronary artery of native heart without angina pectoris  I25.10     3. Atherosclerosis of both carotid arteries  I65.23     4. Non-insulin  dependent type 2 diabetes mellitus (HCC)  E11.9     5. Pure hypertriglyceridemia  E78.1     6. Benign hypertension  I10       Recommendation(s):  Angina pectoris Atherosclerosis of  native coronary artery of native heart without angina pectoris Last episode of angina pectoris approximately 3 to 4 weeks ago. No active chest pain. Prior EKG noted sinus rhythm with ST-T changes concerning for anterolateral and inferior ischemia. Coronary CTA/FFR concerning for multivessel CAD. Antianginal therapy: Carvedilol, nitro as needed Will add Imdur 30 mg p.o. every morning Continue Crestor  40 mg p.o. nightly Recommended going to the hospital for urgent evaluation if she has reoccurrence of chest pain or shortness of breath.  Both patient and husband agreeable with the plan of care. Spoke with Cath Lab holding earliest left heart catheterization appointment is next week. Patient is advised not to overexert in the meantime and to seek medical attention by going to the closest ER via EMS if she has reoccurrence of chest pain.  Atherosclerosis of both carotid arteries Bilateral carotid bruits left greater than right Had a carotid duplex with PCP, will request records but patient was able to share them with me on her smart phone during the office visit.  In summary left ICA disease reported to be 70-99% and right ICA disease 50 to 69%. Tentatively scheduled for CTA head and neck in January 2026 Continue antiplatelet therapy and lipid-lowering agents  Non-insulin  dependent type 2 diabetes mellitus (HCC) Reemphasized importance of glycemic control.  Pure hypertriglyceridemia Increase Crestor  from 20 mg p.o. daily to 40 mg p.o. daily. Plan is to repeat labs in 6 weeks to reevaluate therapy. Continue with lifestyle modifications. If triglycerides upon repeat check do not improve with increasing statins and lifestyle modifications that additional pharmacological therapy should be considered  Benign hypertension Office and home blood pressures are not at goal. Will add Imdur 30 mg p.o. every morning. Patient is advised to continue to check her blood pressures at home. Recommend a goal  SBP around 130 mmHg, keep in mind she has bilateral carotid disease.    Orders Placed:  Orders Placed This Encounter  Procedures   CBC   LEFT HEART CATH     Final Medication List:    Meds ordered this encounter  Medications   rosuvastatin  (CRESTOR ) 40 MG tablet    Sig: Take 1 tablet (40 mg total) by mouth daily.    Dispense:  90 tablet    Refill:  3   isosorbide mononitrate (IMDUR) 30 MG 24 hr tablet    Sig: Take 1 tablet (30 mg total) by mouth daily.    Dispense:  90 tablet    Refill:  3    Medications Discontinued During This Encounter  Medication Reason   rosuvastatin  (CRESTOR ) 20 MG tablet Dose change      Current Outpatient Medications:    acetaminophen (TYLENOL) 500 MG tablet, Take 500 mg by mouth every  6 (six) hours as needed., Disp: , Rfl:    ALPRAZolam  (XANAX ) 0.5 MG tablet, 1 tablet Orally Twice a day As needed, Disp: , Rfl:    aspirin  EC 81 MG tablet, Take 1 tablet (81 mg total) by mouth daily. Swallow whole., Disp: , Rfl:    carvedilol (COREG) 3.125 MG tablet, Take 3.125 mg by mouth 2 (two) times daily., Disp: , Rfl:    Cholecalciferol 125 MCG (5000 UT) capsule, Take 5,000 Units by mouth daily., Disp: , Rfl:    fenofibrate  micronized (LOFIBRA) 134 MG capsule, TAKE 1 CAPSULE DAILY FOR TRIGLYCERIDES ( BLOOD FATS ), Disp: 90 capsule, Rfl: 3   hydrochlorothiazide  (HYDRODIURIL ) 25 MG tablet, TAKE 1 TABLET BY MOUTH EVERY MORNING FOR BLOOD PRESSURE AND FLUID RETENTION, Disp: 90 tablet, Rfl: 3   isosorbide mononitrate (IMDUR) 30 MG 24 hr tablet, Take 1 tablet (30 mg total) by mouth daily., Disp: 90 tablet, Rfl: 3   Lancets (ONETOUCH DELICA PLUS LANCET30G) MISC, USE TO TEST BLOOD SUGAR ONCE DAILY, Disp: 100 each, Rfl: 11   levothyroxine  (SYNTHROID ) 100 MCG tablet, 1 tablet in the morning on an empty stomach Orally Once a day; Duration: 90 days, Disp: , Rfl:    metFORMIN  (GLUCOPHAGE -XR) 500 MG 24 hr tablet, Take  2 tablets  2 x /day  with Meals  for Diabetes, Disp: 360  tablet, Rfl: 3   nitroGLYCERIN  (NITROSTAT ) 0.4 MG SL tablet, Place 1 tablet (0.4 mg total) under the tongue every 5 (five) minutes as needed for chest pain. If you require more than two tablets five minutes apart go to the nearest ER via EMS., Disp: 30 tablet, Rfl: 0   olmesartan  (BENICAR ) 40 MG tablet, TAKE 1 TABLET EVERY NIGHT FOR BP & DIABETIC KIDNEY PROTECTION, Disp: 90 tablet, Rfl: 3   ONETOUCH VERIO test strip, CHECK BLOOD SUGAR DAILY ( DX: E.11 ), Disp: 100 strip, Rfl: 3   rosuvastatin  (CRESTOR ) 40 MG tablet, Take 1 tablet (40 mg total) by mouth daily., Disp: 90 tablet, Rfl: 3  Consent:   Informed Consent   Shared Decision Making/Informed Consent The risks [stroke (1 in 1000), death (1 in 1000), kidney failure [usually temporary] (1 in 500), bleeding (1 in 200), allergic reaction [possibly serious] (1 in 200)], benefits (diagnostic support and management of coronary artery disease) and alternatives of a cardiac catheterization were discussed in detail with Ms. Woodlief and she is willing to proceed.     Disposition:   In January 2026  Her questions and concerns were addressed to her satisfaction. She voices understanding of the recommendations provided during this encounter.   Medical decision making: High -worked in to reevaluate symptoms and to review her coronary CTA.  Images of coronary CTA and FFR reviewed with the patient and husband.  Informed consent obtained for left heart catheterization.   Prescription drug management Educated both patient and husband to seek medical attention by going to the closest ER if she has reoccurrence of chest pain for more expedited evaluation.  They verbalized understanding.  Signed, Madonna Michele HAS, [facility]  A Division of [facility] [address] [place], [place] [address]  04/23/2024 1:06 PM

## 2024-04-23 NOTE — H&P (View-Only) (Signed)
 Cardiology Office Note:    Date:  04/23/2024  NAME:  LISEL Contreras    MRN: [id number] DOB:  1966-01-11   PCP:  Stacia Millman, PA  Former Cardiology Providers: None Primary Cardiologist:  Madonna Large, DO, [facility] (established care 04/15/2024) Electrophysiologist:  None   Chief Complaint  Patient presents with   Follow-up    CAD and review test results     History of Present Illness:    Allison Contreras is a 58 y.o. Caucasian female whose past medical history and cardiovascular risk factors includes: CAD, Carotid artery disease, diabetes mellitus type 2, hypertension, hyperlipidemia, hypertriglyceridemia hypothyroidism.  Patient is referred to the practice for evaluation of coronary artery disease and angina.  Her symptoms are concerning for angina pectoris at the last office visit.  Her EKG is also noted ST-T changes in the anterolateral and inferior leads suggestive of ischemia.  Recommended coronary angiography due to concerns for multivessel CAD based on symptoms and EKG; however, patient was reluctant and wanted to pursue forward with noninvasive testing.  Coronary CTA was performed and completed yesterday which illustrates multivessel CAD.  I called her yesterday to go over the results and recommended to proceed forward with angiography.  Patient still remains hesitant and therefore she was asked to come in today to reevaluate symptoms and to review images and answer questions and concerns that she may have.  Echocardiogram is tentatively scheduled for May 16, 2024. CTA head and neck to evaluate for carotid disease scheduled for May 21, 2024.  Patient is accompanied by her husband Scott at today's office visit.  Since last office visit labs have noted elevated triglycerides as well as LDL.  Crestor  was increased to 40 mg p.o. nightly.  Shared decision was if follow-up labs did not notice improvement in triglycerides pharmacological therapy could be introduced.  Till  then she will focus on lifestyle modifications as well.  Given her coronary CT findings she was advised to come in today as she was worked in to discuss her results and reevaluate symptoms.   Patient denies chest pain or heart failure symptoms. Office blood pressures are significantly high, patient states that she is very anxious. Home blood pressures are >140 mmHg.  Current Medications: Current Meds  Medication Sig   acetaminophen (TYLENOL) 500 MG tablet Take 500 mg by mouth every 6 (six) hours as needed.   ALPRAZolam  (XANAX ) 0.5 MG tablet 1 tablet Orally Twice a day As needed   aspirin  EC 81 MG tablet Take 1 tablet (81 mg total) by mouth daily. Swallow whole.   carvedilol (COREG) 3.125 MG tablet Take 3.125 mg by mouth 2 (two) times daily.   Cholecalciferol 125 MCG (5000 UT) capsule Take 5,000 Units by mouth daily.   fenofibrate  micronized (LOFIBRA) 134 MG capsule TAKE 1 CAPSULE DAILY FOR TRIGLYCERIDES ( BLOOD FATS )   hydrochlorothiazide  (HYDRODIURIL ) 25 MG tablet TAKE 1 TABLET BY MOUTH EVERY MORNING FOR BLOOD PRESSURE AND FLUID RETENTION   isosorbide mononitrate (IMDUR) 30 MG 24 hr tablet Take 1 tablet (30 mg total) by mouth daily.   Lancets (ONETOUCH DELICA PLUS LANCET30G) MISC USE TO TEST BLOOD SUGAR ONCE DAILY   levothyroxine  (SYNTHROID ) 100 MCG tablet 1 tablet in the morning on an empty stomach Orally Once a day; Duration: 90 days   metFORMIN  (GLUCOPHAGE -XR) 500 MG 24 hr tablet Take  2 tablets  2 x /day  with Meals  for Diabetes   nitroGLYCERIN  (NITROSTAT ) 0.4 MG SL tablet Place 1 tablet (  0.4 mg total) under the tongue every 5 (five) minutes as needed for chest pain. If you require more than two tablets five minutes apart go to the nearest ER via EMS.   olmesartan  (BENICAR ) 40 MG tablet TAKE 1 TABLET EVERY NIGHT FOR BP & DIABETIC KIDNEY PROTECTION   ONETOUCH VERIO test strip CHECK BLOOD SUGAR DAILY ( DX: E.11 )   rosuvastatin  (CRESTOR ) 40 MG tablet Take 1 tablet (40 mg total) by  mouth daily.   [DISCONTINUED] rosuvastatin  (CRESTOR ) 20 MG tablet Take 20 mg by mouth daily.     Allergies:    Codeine, Ppd [tuberculin purified protein derivative], and Lisinopril    Past Medical History: Past Medical History:  Diagnosis Date   Chest pain    Hypertension    Hypothyroidism    Splenomegaly 02/14/2019   02/14/2019 abd US : Spleen measures 14.1 x 15.6 x 4.7 cm with a measured splenic volume of 542 cubic cm. No focal splenic lesions are evident.    Past Surgical History: Past Surgical History:  Procedure Laterality Date   LYMPH NODE DISSECTION     NECK- BENIGN    Social History: Social History   Tobacco Use   Smoking status: Never    Passive exposure: Past   Smokeless tobacco: Never  Vaping Use   Vaping status: Never Used  Substance Use Topics   Alcohol use: Yes    Comment: occ.   Drug use: No    Family History: Family History  Problem Relation Age of Onset   Cancer Mother        NON HODGKINS LYMPHOMA   Hypertension Mother    Diabetes Mother    Hyperlipidemia Mother    Hypertension Father    Heart disease Father    Breast cancer Neg Hx     ROS:   Review of Systems  Cardiovascular:  Negative for chest pain, claudication, irregular heartbeat, leg swelling, near-syncope, orthopnea, palpitations, paroxysmal nocturnal dyspnea and syncope.  Respiratory:  Negative for shortness of breath.   Hematologic/Lymphatic: Negative for bleeding problem.    EKGs/Labs/Other Studies Reviewed:    Echocardiogram: Will order  Coronary CTA: 04/22/2024 1. Coronary calcium  score of 683. This was 99th percentile for age and sex matched control.   2. Normal coronary origins with co-dominance.   3. CAD-RADS 4 Severe coronary artery disease.   4. Severe stenosis (70-99%) at proximal LAD due to calcified plaque. Severe stenosis (70-99%) at proximal/mid LAD due to mixed but predominantly calcified plaque.   5. Severe stenosis (70-99%) due to mixed plaque at mid  LCX   6. Severe stenosis (70-99%) due to mixed plaque from proximal to proximal/mid RCA.   7. CT FFR will be performed and reported separately due to concern for multivessel CAD.   8. Aortic atherosclerosis.   9. Patent foramen ovale.  10. CT FFR analysis showed significant multivessel CAD in LAD, RCA,and LCx.  11.  Radiology overread pending     Carotid duplex Performed at [facility] physicians, results provided with the patient on her smart phone Left ICA proximal 70-99%. Right ICA proximal 50-69% Patent bilateral vertebral arteries  Labs:    Latest Ref Rng & Units 03/28/2023    9:37 AM 12/26/2022    3:03 PM 09/19/2022   11:03 AM  CBC  WBC 3.8 - 10.8 Thousand/uL 9.3  9.2  8.2   Hemoglobin 11.7 - 15.5 g/dL 87.2  87.0  87.3   Hematocrit 35.0 - 45.0 % 39.6  39.3  38.2   Platelets 140 -  400 Thousand/uL 285  305  264        Latest Ref Rng & Units 04/16/2024    8:09 AM 03/28/2023    9:37 AM 12/26/2022    3:03 PM  BMP  Glucose 70 - 99 mg/dL 867  867  96   BUN 6 - 24 mg/dL 21  16  18    Creatinine 0.57 - 1.00 mg/dL 8.75  8.94  8.96   BUN/Creat Ratio 9 - 23 17  15   SEE NOTE:   Sodium 134 - 144 mmol/L 136  138  138   Potassium 3.5 - 5.2 mmol/L 4.6  4.4  4.4   Chloride 96 - 106 mmol/L 98  102  100   CO2 20 - 29 mmol/L 24  26  29    Calcium  8.7 - 10.2 mg/dL 9.7  9.7  89.3       Latest Ref Rng & Units 04/16/2024    8:09 AM 03/28/2023    9:37 AM 12/26/2022    3:03 PM  CMP  Glucose 70 - 99 mg/dL 867  867  96   BUN 6 - 24 mg/dL 21  16  18    Creatinine 0.57 - 1.00 mg/dL 8.75  8.94  8.96   Sodium 134 - 144 mmol/L 136  138  138   Potassium 3.5 - 5.2 mmol/L 4.6  4.4  4.4   Chloride 96 - 106 mmol/L 98  102  100   CO2 20 - 29 mmol/L 24  26  29    Calcium  8.7 - 10.2 mg/dL 9.7  9.7  89.3   Total Protein 6.0 - 8.5 g/dL 7.4  7.4  7.5   Total Bilirubin 0.0 - 1.2 mg/dL 0.4  0.4  0.4   Alkaline Phos 49 - 135 IU/L 35     AST 0 - 40 IU/L 20  14  15    ALT 0 - 32 IU/L 20  17  18      Lab  Results  Component Value Date   CHOL 153 04/16/2024   HDL 36 (L) 04/16/2024   LDLCALC 89 04/16/2024   TRIG 162 (H) 04/16/2024   CHOLHDL 4.3 04/16/2024   Recent Labs    04/16/24 0809  LIPOA <8.4   No components found for: NTPROBNP No results for input(s): PROBNP in the last 8760 hours. No results for input(s): TSH in the last 8760 hours.  External Labs: Collected: December 26, 2023 [facility] database. Total cholesterol 153, triglycerides 204, HDL 36, LDL 82, A1c 6.4%. Hemoglobin 12.8. Potassium 4.8. Magnesium 2.1  Physical Exam:    Today's Vitals   04/23/24 1143 04/23/24 1144 04/23/24 1249  BP: (!) 191/84 (!) 203/77 (!) 180/75  Pulse: 83 79 85  Resp: 17    SpO2: 99%    Weight: 136 lb (61.7 kg)    Height: 5' 1 (1.549 m)     Body mass index is 25.7 kg/m. Wt Readings from Last 3 Encounters:  04/23/24 136 lb (61.7 kg)  04/15/24 137 lb 6.4 oz (62.3 kg)  04/09/24 139 lb (63 kg)    Physical Exam  Constitutional: No distress.  hemodynamically stable  Neck: No JVD present.  Cardiovascular: Normal rate, regular rhythm, S1 normal and S2 normal. Exam reveals no gallop, no S3 and no S4.  No murmur heard. Pulses:      Carotid pulses are  on the right side with bruit and  on the left side with bruit. Pulmonary/Chest: Effort normal and breath sounds normal. No stridor.  She has no wheezes. She has no rales.  Musculoskeletal:        General: No edema.     Cervical back: Neck supple.  Skin: Skin is warm.     Impression & Recommendation(s):  Impression:   ICD-10-CM   1. Angina pectoris  I20.9 CBC    LEFT HEART CATH    2. Atherosclerosis of native coronary artery of native heart without angina pectoris  I25.10     3. Atherosclerosis of both carotid arteries  I65.23     4. Non-insulin  dependent type 2 diabetes mellitus (HCC)  E11.9     5. Pure hypertriglyceridemia  E78.1     6. Benign hypertension  I10       Recommendation(s):  Angina pectoris Atherosclerosis of  native coronary artery of native heart without angina pectoris Last episode of angina pectoris approximately 3 to 4 weeks ago. No active chest pain. Prior EKG noted sinus rhythm with ST-T changes concerning for anterolateral and inferior ischemia. Coronary CTA/FFR concerning for multivessel CAD. Antianginal therapy: Carvedilol, nitro as needed Will add Imdur 30 mg p.o. every morning Continue Crestor  40 mg p.o. nightly Recommended going to the hospital for urgent evaluation if she has reoccurrence of chest pain or shortness of breath.  Both patient and husband agreeable with the plan of care. Spoke with Cath Lab holding earliest left heart catheterization appointment is next week. Patient is advised not to overexert in the meantime and to seek medical attention by going to the closest ER via EMS if she has reoccurrence of chest pain.  Atherosclerosis of both carotid arteries Bilateral carotid bruits left greater than right Had a carotid duplex with PCP, will request records but patient was able to share them with me on her smart phone during the office visit.  In summary left ICA disease reported to be 70-99% and right ICA disease 50 to 69%. Tentatively scheduled for CTA head and neck in January 2026 Continue antiplatelet therapy and lipid-lowering agents  Non-insulin  dependent type 2 diabetes mellitus (HCC) Reemphasized importance of glycemic control.  Pure hypertriglyceridemia Increase Crestor  from 20 mg p.o. daily to 40 mg p.o. daily. Plan is to repeat labs in 6 weeks to reevaluate therapy. Continue with lifestyle modifications. If triglycerides upon repeat check do not improve with increasing statins and lifestyle modifications that additional pharmacological therapy should be considered  Benign hypertension Office and home blood pressures are not at goal. Will add Imdur 30 mg p.o. every morning. Patient is advised to continue to check her blood pressures at home. Recommend a goal  SBP around 130 mmHg, keep in mind she has bilateral carotid disease.    Orders Placed:  Orders Placed This Encounter  Procedures   CBC   LEFT HEART CATH     Final Medication List:    Meds ordered this encounter  Medications   rosuvastatin  (CRESTOR ) 40 MG tablet    Sig: Take 1 tablet (40 mg total) by mouth daily.    Dispense:  90 tablet    Refill:  3   isosorbide mononitrate (IMDUR) 30 MG 24 hr tablet    Sig: Take 1 tablet (30 mg total) by mouth daily.    Dispense:  90 tablet    Refill:  3    Medications Discontinued During This Encounter  Medication Reason   rosuvastatin  (CRESTOR ) 20 MG tablet Dose change      Current Outpatient Medications:    acetaminophen (TYLENOL) 500 MG tablet, Take 500 mg by mouth every  6 (six) hours as needed., Disp: , Rfl:    ALPRAZolam  (XANAX ) 0.5 MG tablet, 1 tablet Orally Twice a day As needed, Disp: , Rfl:    aspirin  EC 81 MG tablet, Take 1 tablet (81 mg total) by mouth daily. Swallow whole., Disp: , Rfl:    carvedilol (COREG) 3.125 MG tablet, Take 3.125 mg by mouth 2 (two) times daily., Disp: , Rfl:    Cholecalciferol 125 MCG (5000 UT) capsule, Take 5,000 Units by mouth daily., Disp: , Rfl:    fenofibrate  micronized (LOFIBRA) 134 MG capsule, TAKE 1 CAPSULE DAILY FOR TRIGLYCERIDES ( BLOOD FATS ), Disp: 90 capsule, Rfl: 3   hydrochlorothiazide  (HYDRODIURIL ) 25 MG tablet, TAKE 1 TABLET BY MOUTH EVERY MORNING FOR BLOOD PRESSURE AND FLUID RETENTION, Disp: 90 tablet, Rfl: 3   isosorbide mononitrate (IMDUR) 30 MG 24 hr tablet, Take 1 tablet (30 mg total) by mouth daily., Disp: 90 tablet, Rfl: 3   Lancets (ONETOUCH DELICA PLUS LANCET30G) MISC, USE TO TEST BLOOD SUGAR ONCE DAILY, Disp: 100 each, Rfl: 11   levothyroxine  (SYNTHROID ) 100 MCG tablet, 1 tablet in the morning on an empty stomach Orally Once a day; Duration: 90 days, Disp: , Rfl:    metFORMIN  (GLUCOPHAGE -XR) 500 MG 24 hr tablet, Take  2 tablets  2 x /day  with Meals  for Diabetes, Disp: 360  tablet, Rfl: 3   nitroGLYCERIN  (NITROSTAT ) 0.4 MG SL tablet, Place 1 tablet (0.4 mg total) under the tongue every 5 (five) minutes as needed for chest pain. If you require more than two tablets five minutes apart go to the nearest ER via EMS., Disp: 30 tablet, Rfl: 0   olmesartan  (BENICAR ) 40 MG tablet, TAKE 1 TABLET EVERY NIGHT FOR BP & DIABETIC KIDNEY PROTECTION, Disp: 90 tablet, Rfl: 3   ONETOUCH VERIO test strip, CHECK BLOOD SUGAR DAILY ( DX: E.11 ), Disp: 100 strip, Rfl: 3   rosuvastatin  (CRESTOR ) 40 MG tablet, Take 1 tablet (40 mg total) by mouth daily., Disp: 90 tablet, Rfl: 3  Consent:   Informed Consent   Shared Decision Making/Informed Consent The risks [stroke (1 in 1000), death (1 in 1000), kidney failure [usually temporary] (1 in 500), bleeding (1 in 200), allergic reaction [possibly serious] (1 in 200)], benefits (diagnostic support and management of coronary artery disease) and alternatives of a cardiac catheterization were discussed in detail with Ms. Woodlief and she is willing to proceed.     Disposition:   In January 2026  Her questions and concerns were addressed to her satisfaction. She voices understanding of the recommendations provided during this encounter.   Medical decision making: High -worked in to reevaluate symptoms and to review her coronary CTA.  Images of coronary CTA and FFR reviewed with the patient and husband.  Informed consent obtained for left heart catheterization.   Prescription drug management Educated both patient and husband to seek medical attention by going to the closest ER if she has reoccurrence of chest pain for more expedited evaluation.  They verbalized understanding.  Signed, Madonna Michele HAS, [facility]  A Division of [facility] [address] [place], [place] [address]  04/23/2024 1:06 PM

## 2024-04-24 ENCOUNTER — Ambulatory Visit (HOSPITAL_COMMUNITY)
Admission: RE | Admit: 2024-04-24 | Discharge: 2024-04-24 | Disposition: A | Discharge status: Home / Self Care | Source: Ambulatory Visit | Attending: Internal Medicine | Admitting: Internal Medicine

## 2024-04-24 DIAGNOSIS — R072 Precordial pain: Secondary | ICD-10-CM | POA: Diagnosis not present

## 2024-04-24 LAB — ECHOCARDIOGRAM COMPLETE: S' Lateral: 2.86 cm

## 2024-04-25 DIAGNOSIS — E119 Type 2 diabetes mellitus without complications: Secondary | ICD-10-CM | POA: Diagnosis not present

## 2024-04-25 DIAGNOSIS — I6523 Occlusion and stenosis of bilateral carotid arteries: Secondary | ICD-10-CM | POA: Diagnosis not present

## 2024-04-25 DIAGNOSIS — I10 Essential (primary) hypertension: Secondary | ICD-10-CM | POA: Diagnosis not present

## 2024-04-25 DIAGNOSIS — I2510 Atherosclerotic heart disease of native coronary artery without angina pectoris: Secondary | ICD-10-CM | POA: Diagnosis not present

## 2024-04-26 ENCOUNTER — Ambulatory Visit: Payer: Self-pay | Admitting: Cardiology

## 2024-04-30 ENCOUNTER — Telehealth: Payer: Self-pay | Admitting: *Deleted

## 2024-04-30 NOTE — Telephone Encounter (Signed)
 Cardiac Catheterization scheduled at [facility] for: Friday May 02, 2024 9 AM Arrival time [facility] Main Entrance A at: 7 AM  Diet: -Nothing to eat after midnight.  Hydration: -May drink clear liquids until 2 hours before the procedure.  Approved liquids: Water, clear tea, black coffee, fruit juices-non-citric and without pulp,Gatorade, plain Jello/popsicles.   -Please drink 16 oz of water 2 hours before procedure.   Medication instructions: -Hold:  Metformin -day of procedure and 48 hours after procedure  Hydrochlorothiazide -AM of procedure -Other usual morning medications can be taken including aspirin  81 mg.  Plan to go home the same day, you will only stay overnight if medically necessary.  You must have responsible adult to drive you home.  Someone must be with you the first 24 hours after you arrive home.  Reviewed procedure instructions with patient.

## 2024-05-02 ENCOUNTER — Other Ambulatory Visit: Payer: Self-pay

## 2024-05-02 ENCOUNTER — Ambulatory Visit (HOSPITAL_COMMUNITY)
Admission: RE | Admit: 2024-05-02 | Discharge: 2024-05-02 | Disposition: A | Discharge status: Home / Self Care | Attending: Cardiology | Admitting: Cardiology

## 2024-05-02 ENCOUNTER — Encounter (HOSPITAL_COMMUNITY): Admission: RE | Disposition: A | Payer: Self-pay | Attending: Cardiology

## 2024-05-02 DIAGNOSIS — I25118 Atherosclerotic heart disease of native coronary artery with other forms of angina pectoris: Secondary | ICD-10-CM | POA: Insufficient documentation

## 2024-05-02 DIAGNOSIS — E781 Pure hyperglyceridemia: Secondary | ICD-10-CM | POA: Insufficient documentation

## 2024-05-02 DIAGNOSIS — I2584 Coronary atherosclerosis due to calcified coronary lesion: Secondary | ICD-10-CM | POA: Diagnosis not present

## 2024-05-02 DIAGNOSIS — Z7984 Long term (current) use of oral hypoglycemic drugs: Secondary | ICD-10-CM | POA: Insufficient documentation

## 2024-05-02 DIAGNOSIS — Z7722 Contact with and (suspected) exposure to environmental tobacco smoke (acute) (chronic): Secondary | ICD-10-CM | POA: Insufficient documentation

## 2024-05-02 DIAGNOSIS — E119 Type 2 diabetes mellitus without complications: Secondary | ICD-10-CM | POA: Insufficient documentation

## 2024-05-02 DIAGNOSIS — I10 Essential (primary) hypertension: Secondary | ICD-10-CM | POA: Diagnosis not present

## 2024-05-02 DIAGNOSIS — I6523 Occlusion and stenosis of bilateral carotid arteries: Secondary | ICD-10-CM | POA: Insufficient documentation

## 2024-05-02 DIAGNOSIS — I209 Angina pectoris, unspecified: Secondary | ICD-10-CM

## 2024-05-02 DIAGNOSIS — E1122 Type 2 diabetes mellitus with diabetic chronic kidney disease: Secondary | ICD-10-CM

## 2024-05-02 DIAGNOSIS — Z79899 Other long term (current) drug therapy: Secondary | ICD-10-CM | POA: Insufficient documentation

## 2024-05-02 HISTORY — PX: LEFT HEART CATH AND CORONARY ANGIOGRAPHY: CATH118249

## 2024-05-02 LAB — GLUCOSE, CAPILLARY: Glucose-Capillary: 134 mg/dL — ABNORMAL HIGH (ref 70–99)

## 2024-05-02 SURGERY — LEFT HEART CATH AND CORONARY ANGIOGRAPHY
Anesthesia: LOCAL

## 2024-05-02 MED ORDER — SODIUM CHLORIDE 0.9% FLUSH: 3.0000 mL | INTRAVENOUS | Status: DC | PRN

## 2024-05-02 MED ORDER — HYDRALAZINE HCL 20 MG/ML IJ SOLN: 10.0000 mg | INTRAMUSCULAR | Status: DC | PRN

## 2024-05-02 MED ORDER — ACETAMINOPHEN 325 MG PO TABS: 650.0000 mg | ORAL_TABLET | ORAL | Status: DC | PRN

## 2024-05-02 MED ORDER — LIDOCAINE HCL (PF) 1 % IJ SOLN
INTRAMUSCULAR | Status: AC
  Filled 2024-05-02: qty 30

## 2024-05-02 MED ORDER — HEPARIN (PORCINE) IN NACL 1000-0.9 UT/500ML-% IV SOLN
INTRAVENOUS | Status: DC | PRN
  Administered 2024-05-02 (×2): 500 mL

## 2024-05-02 MED ORDER — ASPIRIN 81 MG PO CHEW: 81.0000 mg | CHEWABLE_TABLET | ORAL | Status: DC

## 2024-05-02 MED ORDER — VERAPAMIL HCL 2.5 MG/ML IV SOLN
INTRAVENOUS | Status: DC | PRN
  Administered 2024-05-02: 10 mL via INTRA_ARTERIAL

## 2024-05-02 MED ORDER — ONDANSETRON HCL 4 MG/2ML IJ SOLN: 4.0000 mg | Freq: Four times a day (QID) | INTRAMUSCULAR | Status: DC | PRN

## 2024-05-02 MED ORDER — FREE WATER
500.0000 mL | Freq: Once | Status: AC
  Administered 2024-05-02: 500 mL via ORAL

## 2024-05-02 MED ORDER — FENTANYL CITRATE (PF) 100 MCG/2ML IJ SOLN
INTRAMUSCULAR | Status: DC | PRN
  Administered 2024-05-02: 25 ug via INTRAVENOUS

## 2024-05-02 MED ORDER — SODIUM CHLORIDE 0.9 % IV SOLN: 250.0000 mL | INTRAVENOUS | Status: DC | PRN

## 2024-05-02 MED ORDER — LIDOCAINE HCL (PF) 1 % IJ SOLN
INTRAMUSCULAR | Status: DC | PRN
  Administered 2024-05-02: 5 mL via INTRADERMAL

## 2024-05-02 MED ORDER — SODIUM CHLORIDE 0.9% FLUSH: 3.0000 mL | Freq: Two times a day (BID) | INTRAVENOUS | Status: DC

## 2024-05-02 MED ORDER — MIDAZOLAM HCL 2 MG/2ML IJ SOLN
INTRAMUSCULAR | Status: AC
  Filled 2024-05-02: qty 2

## 2024-05-02 MED ORDER — METFORMIN HCL ER 500 MG PO TB24: 500.0000 mg | ORAL_TABLET | Freq: Three times a day (TID) | ORAL | Status: AC

## 2024-05-02 MED ORDER — HEPARIN SODIUM (PORCINE) 1000 UNIT/ML IJ SOLN
INTRAMUSCULAR | Status: DC | PRN
  Administered 2024-05-02: 3000 [IU] via INTRAVENOUS

## 2024-05-02 MED ORDER — IOHEXOL 350 MG/ML SOLN
INTRAVENOUS | Status: DC | PRN
  Administered 2024-05-02: 40 mL

## 2024-05-02 MED ORDER — FENTANYL CITRATE (PF) 100 MCG/2ML IJ SOLN
INTRAMUSCULAR | Status: AC
  Filled 2024-05-02: qty 2

## 2024-05-02 MED ORDER — HEPARIN SODIUM (PORCINE) 1000 UNIT/ML IJ SOLN
INTRAMUSCULAR | Status: AC
  Filled 2024-05-02: qty 10

## 2024-05-02 MED ORDER — FREE WATER: 500.0000 mL | Freq: Once | Status: DC

## 2024-05-02 MED ORDER — MIDAZOLAM HCL (PF) 2 MG/2ML IJ SOLN
INTRAMUSCULAR | Status: DC | PRN
  Administered 2024-05-02: 2 mg via INTRAVENOUS

## 2024-05-02 SURGICAL SUPPLY — 7 items
CATH 5FR JL3.5 JR4 ANG PIG MP (CATHETERS) IMPLANT
DEVICE RAD COMP TR BAND LRG (VASCULAR PRODUCTS) IMPLANT
GLIDESHEATH SLEND SS 6F .021 (SHEATH) IMPLANT
GUIDEWIRE INQWIRE 1.5J.035X260 (WIRE) IMPLANT
PACK CARDIAC CATHETERIZATION (CUSTOM PROCEDURE TRAY) ×1 IMPLANT
SET ATX-X65L (MISCELLANEOUS) IMPLANT
SHEATH PROBE COVER 6X72 (BAG) IMPLANT

## 2024-05-02 NOTE — Interval H&P Note (Signed)
 History and Physical Interval Note:  05/02/2024 8:34 AM  Allison Contreras  has presented today for surgery, with the diagnosis of angina.  The various methods of treatment have been discussed with the patient and family. After consideration of risks, benefits and other options for treatment, the patient has consented to  Procedures: LEFT HEART CATH AND CORONARY ANGIOGRAPHY (N/A) as a surgical intervention.  The patient's history has been reviewed, patient examined, no change in status, stable for surgery.  I have reviewed the patient's chart and labs.  Questions were answered to the patient's satisfaction.   Cath Lab Visit (complete for each Cath Lab visit)  Clinical Evaluation Leading to the Procedure:   ACS: No.  Non-ACS:    Anginal Classification: CCS II  Anti-ischemic medical therapy: Maximal Therapy (2 or more classes of medications)  Non-Invasive Test Results: High-risk stress test findings: cardiac mortality >3%/year  Prior CABG: No previous CABG        Maude [facility] 05/02/2024 8:34 AM

## 2024-05-04 ENCOUNTER — Encounter (HOSPITAL_COMMUNITY): Payer: Self-pay | Admitting: Cardiology

## 2024-05-05 ENCOUNTER — Encounter: Payer: Self-pay | Admitting: Cardiology

## 2024-05-05 NOTE — Telephone Encounter (Signed)
 No, please keep your appt as it will help with planning and Dr. Daniel can use the data for clinical decision making.   Dr. Roselia Snipe

## 2024-05-09 ENCOUNTER — Ambulatory Visit (HOSPITAL_COMMUNITY)

## 2024-05-16 ENCOUNTER — Encounter: Payer: Self-pay | Admitting: Cardiology

## 2024-05-16 ENCOUNTER — Other Ambulatory Visit (HOSPITAL_COMMUNITY)

## 2024-05-16 ENCOUNTER — Ambulatory Visit (HOSPITAL_COMMUNITY)

## 2024-05-20 NOTE — Progress Notes (Unsigned)
 "   [address], [place] [address]             [phone]    TAMRE CASS [facility] Medical Record #[id number] Date of Birth: [birth year]  Referring: [name], [name], MD Primary Care: Allison Millman, PA Primary Cardiologist:Allison Michele, DO  Chief Complaint:    Chief Complaint  Patient presents with   Coronary Artery Disease    Surgical consult/ Cardiac Cath 05/02/24/ ECHO 04/24/24    History of Present Illness:     Allison Contreras is a 59 y.o. female who presents for surgical evaluation of multivessel coronary artery disease. Couple episodes of chest pain during walking in the cold.  Improved with rest.  Couple episdoes in the last several months.    BG runs 120s in the morning on metformin  BID.  SBP 130-148  Has some shortness of breath Denies palpitations, fainting, dizziness, or leg swelling.  Never smoker, occasional EtOH use.  She is right hand dominant. No vein stripping, no vein ablations.   Carotid duplex Performed at [facility] physicians, results provided with the patient on her smart phone Left ICA proximal 70-99%. Right ICA proximal 50-69% Patent bilateral vertebral arteries  LHC (05/02/24): Two significant lesions in the prox-mid and mid-distal RCA, 90% prox LAD and 90% mid Cx lesion TTE (04/24/24): Normal RV, EF 60-65%, no significant valvular disease Chest CT (04/22/24): No significant calcium  in the aorta  Past Medical and Surgical History: Previous Chest Surgery: No Previous Chest Radiation: No Diabetes Mellitus: Yes (metformin ),  HbA1C 6.3% (03/28/23) Anticoagulation: No, Last dose N/A  Creatinine:  Lab Results  Component Value Date   CREATININE 1.24 (H) 04/16/2024   CREATININE 1.05 (H) 03/28/2023   CREATININE 1.03 12/26/2022     Past Medical History:  Diagnosis Date   Chest pain    Hypertension    Hypothyroidism    Splenomegaly 02/14/2019   02/14/2019 abd US : Spleen measures 14.1 x 15.6 x 4.7 cm with a  measured splenic volume of 542 cubic cm. No focal splenic lesions are evident.    Past Surgical History:  Procedure Laterality Date   LEFT HEART CATH AND CORONARY ANGIOGRAPHY N/A 05/02/2024   Procedure: LEFT HEART CATH AND CORONARY ANGIOGRAPHY;  Surgeon: [name], [name], MD;  Location: [facility] INVASIVE CV LAB;  Service: Cardiovascular;  Laterality: N/A;   LYMPH NODE DISSECTION     NECK- BENIGN    Social History:  Tobacco Use History[1]  Social History   Substance and Sexual Activity  Alcohol Use Yes   Comment: occ.     Allergies[2]  Medications: Asprin: *** Statin: *** Beta Blocker: *** Ace Inhibitor: *** Anti-Coagulation: ***  Current Outpatient Medications  Medication Sig Dispense Refill   acetaminophen  (TYLENOL ) 500 MG tablet Take 500 mg by mouth every 6 (six) hours as needed.     aspirin  EC 81 MG tablet Take 1 tablet (81 mg total) by mouth daily. Swallow whole.     carvedilol (COREG) 3.125 MG tablet Take 3.125 mg by mouth 2 (two) times daily.     Cholecalciferol 125 MCG (5000 UT) capsule Take 5,000 Units by mouth daily.     fenofibrate  micronized (LOFIBRA) 134 MG capsule TAKE 1 CAPSULE DAILY FOR TRIGLYCERIDES ( BLOOD FATS ) 90 capsule 3   hydrochlorothiazide  (HYDRODIURIL ) 25 MG tablet TAKE 1 TABLET BY MOUTH EVERY MORNING FOR BLOOD PRESSURE AND FLUID RETENTION 90 tablet 3   Lancets (ONETOUCH DELICA PLUS LANCET30G) MISC USE TO  TEST BLOOD SUGAR ONCE DAILY 100 each 11   levothyroxine  (SYNTHROID ) 100 MCG tablet 1 tablet in the morning on an empty stomach Orally Once a day; Duration: 90 days     metFORMIN  (GLUCOPHAGE -XR) 500 MG 24 hr tablet Take 1 tablet (500 mg total) by mouth 3 (three) times daily. 1 tab in the morning, 1 at lunch, and sometimes 1 in the evening     nitroGLYCERIN  (NITROSTAT ) 0.4 MG SL tablet Place 1 tablet (0.4 mg total) under the tongue every 5 (five) minutes as needed for chest pain. If you require more than two tablets five minutes apart go to the  nearest ER via EMS. 30 tablet 0   olmesartan  (BENICAR ) 40 MG tablet TAKE 1 TABLET EVERY NIGHT FOR BP & DIABETIC KIDNEY PROTECTION 90 tablet 3   ONETOUCH VERIO test strip CHECK BLOOD SUGAR DAILY ( DX: E.11 ) 100 strip 3   rosuvastatin  (CRESTOR ) 40 MG tablet Take 1 tablet (40 mg total) by mouth daily. 90 tablet 3   ALPRAZolam  (XANAX ) 0.5 MG tablet 1 tablet Orally Twice a day As needed (Patient not taking: Reported on 05/22/2024)     isosorbide  mononitrate (IMDUR ) 30 MG 24 hr tablet Take 2 tablets (60 mg total) by mouth daily.     No current facility-administered medications for this visit.    (Not in a hospital admission)   Family History  Problem Relation Age of Onset   Cancer Mother        NON HODGKINS LYMPHOMA   Hypertension Mother    Diabetes Mother    Hyperlipidemia Mother    Hypertension Father    Heart disease Father    Breast cancer Neg Hx      Review of Systems:   ROS    Physical Exam: BP (!) 203/76   Pulse 80   Ht 5' 2 (1.575 Contreras)   Wt 135 lb (61.2 kg)   LMP 09/06/2016   SpO2 99% Comment: RA  BMI 24.69 kg/Contreras  Physical Exam    Diagnostic Studies & Laboratory data: Cardiac Studies & Procedures   ______________________________________________________________________________________________ CARDIAC CATHETERIZATION  CARDIAC CATHETERIZATION 05/02/2024  Conclusion   Mid RCA to Dist RCA lesion is 70% stenosed.   Prox RCA to Mid RCA lesion is 80% stenosed.   Prox LAD to Mid LAD lesion is 90% stenosed.   Ost Cx to Prox Cx lesion is 50% stenosed.   Mid Cx lesion is 90% stenosed.   LV end diastolic pressure is normal.  Severe 3 vessel obstructive CAD Normal LVEDP  Disease is not favorable for PCI with multiple segmental lesions/calcification and tortuosity. Would recommend consideration for CABG  Findings Coronary Findings Diagnostic  Dominance: Right  Left Anterior Descending The vessel is moderately tortuous. Prox LAD to Mid LAD lesion  is 90% stenosed. The lesion is segmental. The lesion is severely calcified.  Left Circumflex Ost Cx to Prox Cx lesion is 50% stenosed. Mid Cx lesion is 90% stenosed.  Right Coronary Artery Prox RCA to Mid RCA lesion is 80% stenosed. The lesion is segmental. The lesion is moderately calcified. Mid RCA to Dist RCA lesion is 70% stenosed. The lesion is segmental.  Intervention  No interventions have been documented.     ECHOCARDIOGRAM  ECHOCARDIOGRAM COMPLETE 04/24/2024  Narrative ECHOCARDIOGRAM REPORT    Patient Name:   Allison Contreras Date of Exam: 04/24/2024 Medical Rec #:  [id number]        Height:       61.0 in Accession #:  [id number]       Weight:       136.0 lb Date of Birth:  12-30-1965         BSA:          1.603 Contreras Patient Age:    58 years         BP:           171/88 mmHg Patient Gender: F                HR:           67 bpm. Exam Location:  [address]  Procedure: 2D Echo and Strain Analysis (Both Spectral and Color Flow Doppler were utilized during procedure).  Indications:    R07.2 Precordial pain  History:        Patient has no prior history of Echocardiogram examinations. CAD; Risk Factors:Hypertension, Diabetes and Dyslipidemia. Hypothyroidism. Carotid artery disease.  Sonographer:    Jon Hacker RCS Referring Phys: [id number] MADONNA LARGE  IMPRESSIONS   1. Left ventricular ejection fraction, by estimation, is 60 to 65%. The left ventricle has normal function. The left ventricle has no regional wall motion abnormalities. There is moderate concentric left ventricular hypertrophy. Left ventricular diastolic parameters were normal. The average left ventricular global longitudinal strain is -26.8 %. The global longitudinal strain is normal. 2. Right ventricular systolic function is normal. The right ventricular size is normal. 3. The mitral valve is normal in structure. Mild mitral valve regurgitation. No evidence of mitral stenosis. 4. The aortic valve  is normal in structure. Aortic valve regurgitation is not visualized. No aortic stenosis is present. 5. The inferior vena cava is normal in size with greater than 50% respiratory variability, suggesting right atrial pressure of 3 mmHg.  Comparison(s): No prior Echocardiogram.  FINDINGS Left Ventricle: Left ventricular ejection fraction, by estimation, is 60 to 65%. The left ventricle has normal function. The left ventricle has no regional wall motion abnormalities. The average left ventricular global longitudinal strain is -26.8 %. Strain was performed and the global longitudinal strain is normal. The left ventricular internal cavity size was normal in size. There is moderate concentric left ventricular hypertrophy. Left ventricular diastolic parameters were normal.  Right Ventricle: The right ventricular size is normal. No increase in right ventricular wall thickness. Right ventricular systolic function is normal.  Left Atrium: Left atrial size was normal in size.  Right Atrium: Right atrial size was normal in size.  Pericardium: There is no evidence of pericardial effusion.  Mitral Valve: The mitral valve is normal in structure. Mild mitral valve regurgitation. No evidence of mitral valve stenosis.  Tricuspid Valve: The tricuspid valve is normal in structure. Tricuspid valve regurgitation is not demonstrated. No evidence of tricuspid stenosis.  Aortic Valve: The aortic valve is normal in structure. Aortic valve regurgitation is not visualized. No aortic stenosis is present.  Pulmonic Valve: The pulmonic valve was normal in structure. Pulmonic valve regurgitation is not visualized. No evidence of pulmonic stenosis.  Aorta: The aortic root is normal in size and structure.  Venous: The inferior vena cava is normal in size with greater than 50% respiratory variability, suggesting right atrial pressure of 3 mmHg.  IAS/Shunts: No atrial level shunt detected by color flow Doppler.   LEFT  VENTRICLE PLAX 2D LVIDd:         4.30 cm   Diastology LVIDs:         2.86 cm   LV e' medial:    7.29 cm/s LV  PW:         1.20 cm   LV E/e' medial:  16.6 LV IVS:        1.12 cm   LV e' lateral:   11.00 cm/s LVOT diam:     2.00 cm   LV E/e' lateral: 11.0 LV SV:         89 LV SV Index:   55        2D Longitudinal Strain LVOT Area:     3.14 cm  2D Strain GLS (A4C):   -24.5 % 2D Strain GLS (A3C):   -25.3 % 2D Strain GLS (A2C):   -30.6 % 2D Strain GLS Avg:     -26.8 %  RIGHT VENTRICLE RV Basal diam:  2.35 cm RV S prime:     11.30 cm/s TAPSE (Contreras-mode): 2.5 cm  LEFT ATRIUM             Index        RIGHT ATRIUM           Index LA diam:        3.80 cm 2.37 cm/Contreras   RA Area:     10.70 cm LA Vol (A2C):   32.7 ml 20.40 ml/Contreras  RA Volume:   19.90 ml  12.41 ml/Contreras LA Vol (A4C):   42.1 ml 26.26 ml/Contreras LA Biplane Vol: 40.4 ml 25.20 ml/Contreras AORTIC VALVE LVOT Vmax:   123.00 cm/s LVOT Vmean:  83.800 cm/s LVOT VTI:    0.282 Contreras  AORTA Ao Root diam: 2.50 cm Ao Asc diam:  3.00 cm  MV E velocity: 121.00 cm/s MV A velocity: 84.90 cm/s   SHUNTS MV E/A ratio:  1.43         Systemic VTI:  0.28 Contreras Systemic Diam: 2.00 cm  Joelle Azobou Tonleu Electronically signed by Joelle Cedars Tonleu Signature Date/Time: 04/24/2024/11:10:42 AM    Final      CT SCANS  CT CORONARY MORPH W/CTA COR W/SCORE 04/22/2024  Addendum 05/03/2024  4:00 PM ADDENDUM REPORT: 05/03/2024 15:58  EXAM: OVER-READ INTERPRETATION  CT CHEST  The following report is an over-read performed by radiologist Dr. Andrea Gasman of [facility] on 05/03/2024. This over-read does not include interpretation of cardiac or coronary anatomy or pathology. The coronary CTA interpretation by the cardiologist is attached.  COMPARISON:  None.  FINDINGS: Vascular: Aortic atherosclerosis. The included aorta is normal in caliber.  Mediastinum/nodes: No adenopathy or mass. Unremarkable esophagus.  Lungs: No focal airspace  disease. No pulmonary nodule. No pleural fluid. The included airways are patent.  Upper abdomen: No acute or unexpected findings.  Musculoskeletal: There are no acute or suspicious osseous abnormalities.  IMPRESSION: Aortic Atherosclerosis (ICD10-I70.0).   Electronically Signed By: Andrea Gasman Contreras.D. On: 05/03/2024 15:58  Narrative HISTORY: Chest pain, nonspecific  EXAM: Cardiac/Coronary  CT  PROTOCOL: A non-contrast, gated CT scan was obtained with axial slices of 2.5 mm through the heart for calcium  scoring. Calcium  scoring was performed using the Agatston method. A 120 kV prospective, gated, contrast cardiac CT scan was obtained. Gantry rotation speed was 230 msec and collimation was 0.63 mm. Two sublingual nitroglycerin  tablets (0.8 mg) were given. The 3D data set was reconstructed with motion correction for the best systolic or diastolic phase. Images were analyzed on a dedicated workstation using MPR, MIP, and VRT modes. The patient received 95 cc of contrast.  FINDINGS: Image quality: Excellent.  Artifact: Limited.  Coronary calcium  score is 683, which places the patient in  the 99th percentile for age and sex matched control.  Coronary arteries: Normal coronary origins.  Co-dominance.  Left Main Coronary Artery: Normal caliber, short is length, bifurcates into left anterior descending artery (LAD) and a left circumflex artery (LCX). Left main is patent.  Left Anterior Descending Artery: Severe stenosis (70-99%) at proximal LAD due to calcified plaque. Severe stenosis (70-99%) at proximal/mid LAD due to mixed but predominantly calcified plaque. Remainder of the vessel is patent.  First Diagonal branch: Not well visualized  Second Diagonal branch: Patent  Third Diagonal branch: Patent  Left Circumflex Artery: Mild (25-49%) calcified plaque at proximal LCx, severe stenosis (70-99%) due to mixed plaque at mid LCX, distal segment is patent.  First  Obtuse Marginal branch: Small, patent  Second Obtuse Marginal branch: Patent  Right Coronary Artery: Severe stenosis (70-99%) approximately 32 mm in length due to mixed plaque from proximal to proximal/mid RCA. Mid to distal RCA is patent.  Aorta: Normal size, 28 mm at the mid ascending aorta (level of the PA bifurcation) measured double oblique. Aortic atherosclerosis.  Aortic Valve: Native valve, trileaflet aortic valve, no calcification.  Mitral valve: Native valve, no mitral annular calcification.  Other findings:  Normal pulmonary vein drainage into the left atrium.  Normal left atrial appendage without thrombus.  Normal size of the pulmonary artery.  Patent foramen ovale.  Please see separate report from [facility] for non-cardiac findings.  IMPRESSION: 1. Coronary calcium  score of 683. This was 99th percentile for age and sex matched control.  2. Normal coronary origins with co-dominance.  3. CAD-RADS 4 Severe coronary artery disease.  4. Severe stenosis (70-99%) at proximal LAD due to calcified plaque. Severe stenosis (70-99%) at proximal/mid LAD due to mixed but predominantly calcified plaque.  5. Severe stenosis (70-99%) due to mixed plaque at mid LCX  6. Severe stenosis (70-99%) due to mixed plaque from proximal to proximal/mid RCA.  7. CT FFR will be performed and reported separately due to concern for multivessel CAD.  8. Aortic atherosclerosis.  9. Patent foramen ovale.  RECOMMENDATION: Await CT FFR results.  Consider symptom-guided anti-ischemic pharmacotherapy as well as risk factor modification per guideline directed care.  Electronically Signed: By: Madonna Large On: 04/22/2024 14:02     ______________________________________________________________________________________________     EKG: *** I have independently reviewed the above radiologic studies and discussed with the patient   Recent Lab Findings: Lab Results   Component Value Date   WBC 9.1 04/23/2024   HGB 12.3 04/23/2024   HCT 38.0 04/23/2024   PLT 318 04/23/2024   GLUCOSE 132 (H) 04/16/2024   CHOL 153 04/16/2024   TRIG 162 (H) 04/16/2024   HDL 36 (L) 04/16/2024   LDLCALC 89 04/16/2024   ALT 20 04/16/2024   AST 20 04/16/2024   NA 136 04/16/2024   K 4.6 04/16/2024   CL 98 04/16/2024   CREATININE 1.24 (H) 04/16/2024   BUN 21 04/16/2024   CO2 24 04/16/2024   TSH 0.58 03/28/2023   HGBA1C 6.3 (H) 03/28/2023      Assessment / Plan:   59 y.o. female with ***   CABG on 2/20 - radial and vein.   I  spent {CHL ONC TIME VISIT - DTPQU:[id number]} counseling the patient face to face.   Con RAMAN Raechel Marcos 05/22/2024 11:31 AM            [1] Social History Tobacco Use  Smoking Status Never   Passive exposure: Past  Smokeless Tobacco Never  [2] Allergies Allergen  Reactions   Codeine    Ppd [Tuberculin Purified Protein Derivative]     Positive reaction 2001   Lisinopril      Dry cough  "

## 2024-05-21 ENCOUNTER — Ambulatory Visit (HOSPITAL_COMMUNITY)
Admission: RE | Admit: 2024-05-21 | Discharge: 2024-05-21 | Disposition: A | Discharge status: Home / Self Care | Payer: Self-pay | Source: Ambulatory Visit | Attending: Internal Medicine | Admitting: Internal Medicine

## 2024-05-21 DIAGNOSIS — I6523 Occlusion and stenosis of bilateral carotid arteries: Secondary | ICD-10-CM | POA: Diagnosis present

## 2024-05-21 MED ORDER — IOHEXOL 350 MG/ML SOLN
75.0000 mL | Freq: Once | INTRAVENOUS | Status: AC | PRN
  Administered 2024-05-21: 75 mL via INTRAVENOUS

## 2024-05-22 ENCOUNTER — Ambulatory Visit

## 2024-05-22 ENCOUNTER — Other Ambulatory Visit: Payer: Self-pay | Admitting: *Deleted

## 2024-05-22 ENCOUNTER — Encounter: Payer: Self-pay | Admitting: *Deleted

## 2024-05-22 VITALS — BP 203/76 | HR 80 | Ht 62.0 in | Wt 135.0 lb

## 2024-05-22 DIAGNOSIS — I2510 Atherosclerotic heart disease of native coronary artery without angina pectoris: Secondary | ICD-10-CM

## 2024-05-22 MED ORDER — ISOSORBIDE MONONITRATE ER 30 MG PO TB24: 60.0000 mg | ORAL_TABLET | Freq: Every day | ORAL | Status: DC

## 2024-05-22 NOTE — Patient Instructions (Signed)
 Bring a bra with you to the hospital on the day of surgery.  Look for something that is soft and comfortable that provides some support but is not constricting.  Below is an example of a bra that is available on [employer].

## 2024-05-23 ENCOUNTER — Ambulatory Visit: Attending: Cardiology | Admitting: Cardiology

## 2024-05-23 ENCOUNTER — Encounter: Payer: Self-pay | Admitting: Cardiology

## 2024-05-23 VITALS — BP 176/80 | HR 67 | Ht 61.5 in | Wt 130.8 lb

## 2024-05-23 DIAGNOSIS — I10 Essential (primary) hypertension: Secondary | ICD-10-CM

## 2024-05-23 DIAGNOSIS — I6522 Occlusion and stenosis of left carotid artery: Secondary | ICD-10-CM

## 2024-05-23 DIAGNOSIS — M4802 Spinal stenosis, cervical region: Secondary | ICD-10-CM

## 2024-05-23 DIAGNOSIS — E119 Type 2 diabetes mellitus without complications: Secondary | ICD-10-CM

## 2024-05-23 DIAGNOSIS — I25118 Atherosclerotic heart disease of native coronary artery with other forms of angina pectoris: Secondary | ICD-10-CM

## 2024-05-23 DIAGNOSIS — E781 Pure hyperglyceridemia: Secondary | ICD-10-CM | POA: Diagnosis not present

## 2024-05-23 MED ORDER — ISOSORBIDE MONONITRATE ER 60 MG PO TB24: 60.0000 mg | ORAL_TABLET | Freq: Every day | ORAL | 3 refills | Status: AC

## 2024-05-23 NOTE — Progress Notes (Signed)
 "   Cardiology Office Note:    Date:  05/23/2024  NAME:  Allison Contreras    MRN: [id number] DOB:  July 21, 1965   PCP:  Stacia Millman, PA  Former Cardiology Providers: None Primary Cardiologist:  Madonna Large, DO, [facility] (established care 04/15/2024) Electrophysiologist:  None   Chief Complaint  Patient presents with   Follow-up    Coronary artery disease    History of Present Illness:    Allison Contreras is a 59 y.o. Caucasian female whose past medical history and cardiovascular risk factors includes: CAD, asymptomatic carotid artery disease, diabetes mellitus type 2, hypertension, hyperlipidemia, hypertriglyceridemia hypothyroidism.  Patient is referred to the practice for evaluation of coronary artery disease and angina.  Her symptoms were concerning for angina pectoris during the initial consult.  Her initial EKG also noted ST-T changes in the anterolateral and inferior leads suggestive of ischemia.  Recommended coronary angiography due to concerns for multivessel CAD based on symptoms and EKG finding; however, patient was reluctant and wanted to pursue forward with noninvasive testing. She underwent coronary CTA which illustrated multivessel CAD we discussed the results in the office and she was scheduled for left heart catheterization.  Angiography reconfirmed multivessel CAD and she was referred to cardiothoracic surgery for evaluation for surgical revascularization.  Patient saw Dr. Daniel on 05/22/2024 and is tentatively scheduled for coronary artery bypass grafting surgery in February 2026 as per the request of the patient/family.   In the interim she also had a CTA head and neck as she brought in outside records from PCP noting bilateral carotid artery stenosis >70%.  Results of the CTA head and neck reviewed with them at today's office visit.  Echocardiogram noted preserved LVEF, normal diastolic function, no significant valvular heart disease.  Patient denies anginal chest pain or heart  failure symptoms. Being formally for that the surgery is tentatively scheduled for July 04, 2024. Home blood pressures range between 135 and 40 mmHg. Patient stated that she is considering discussing initiating low-dose anxiolytic medications with PCP.  Patient is accompanied by her husband Scott at today's office visit.  Current Medications: Current Meds  Medication Sig   acetaminophen  (TYLENOL ) 500 MG tablet Take 500 mg by mouth every 6 (six) hours as needed.   aspirin  EC 81 MG tablet Take 1 tablet (81 mg total) by mouth daily. Swallow whole.   carvedilol (COREG) 3.125 MG tablet Take 3.125 mg by mouth 2 (two) times daily.   Cholecalciferol 125 MCG (5000 UT) capsule Take 5,000 Units by mouth daily.   fenofibrate  micronized (LOFIBRA) 134 MG capsule TAKE 1 CAPSULE DAILY FOR TRIGLYCERIDES ( BLOOD FATS )   hydrochlorothiazide  (HYDRODIURIL ) 25 MG tablet TAKE 1 TABLET BY MOUTH EVERY MORNING FOR BLOOD PRESSURE AND FLUID RETENTION   isosorbide  mononitrate (IMDUR ) 60 MG 24 hr tablet Take 1 tablet (60 mg total) by mouth daily.   Lancets (ONETOUCH DELICA PLUS LANCET30G) MISC USE TO TEST BLOOD SUGAR ONCE DAILY   levothyroxine  (SYNTHROID ) 100 MCG tablet 1 tablet in the morning on an empty stomach Orally Once a day; Duration: 90 days   metFORMIN  (GLUCOPHAGE -XR) 500 MG 24 hr tablet Take 1 tablet (500 mg total) by mouth 3 (three) times daily. 1 tab in the morning, 1 at lunch, and sometimes 1 in the evening   nitroGLYCERIN  (NITROSTAT ) 0.4 MG SL tablet Place 1 tablet (0.4 mg total) under the tongue every 5 (five) minutes as needed for chest pain. If you require more than two tablets five minutes  apart go to the nearest ER via EMS.   olmesartan  (BENICAR ) 40 MG tablet TAKE 1 TABLET EVERY NIGHT FOR BP & DIABETIC KIDNEY PROTECTION   ONETOUCH VERIO test strip CHECK BLOOD SUGAR DAILY ( DX: E.11 )   rosuvastatin  (CRESTOR ) 40 MG tablet Take 1 tablet (40 mg total) by mouth daily.   [DISCONTINUED] isosorbide   mononitrate (IMDUR ) 30 MG 24 hr tablet Take 2 tablets (60 mg total) by mouth daily.     Allergies:    Codeine, Ppd [tuberculin purified protein derivative], and Lisinopril    Past Medical History: Past Medical History:  Diagnosis Date   Chest pain    Hypertension    Hypothyroidism    Splenomegaly 02/14/2019   02/14/2019 abd US : Spleen measures 14.1 x 15.6 x 4.7 cm with a measured splenic volume of 542 cubic cm. No focal splenic lesions are evident.    Past Surgical History: Past Surgical History:  Procedure Laterality Date   LEFT HEART CATH AND CORONARY ANGIOGRAPHY N/A 05/02/2024   Procedure: LEFT HEART CATH AND CORONARY ANGIOGRAPHY;  Surgeon: [name], [name], MD;  Location: [facility] INVASIVE CV LAB;  Service: Cardiovascular;  Laterality: N/A;   LYMPH NODE DISSECTION     NECK- BENIGN    Social History: Social History   Tobacco Use   Smoking status: Never    Passive exposure: Past   Smokeless tobacco: Never  Vaping Use   Vaping status: Never Used  Substance Use Topics   Alcohol use: Yes    Comment: occ.   Drug use: No    Family History: Family History  Problem Relation Age of Onset   Cancer Mother        NON HODGKINS LYMPHOMA   Hypertension Mother    Diabetes Mother    Hyperlipidemia Mother    Hypertension Father    Heart disease Father    Breast cancer Neg Hx     ROS:   Review of Systems  Cardiovascular:  Negative for chest pain, claudication, irregular heartbeat, leg swelling, near-syncope, orthopnea, palpitations, paroxysmal nocturnal dyspnea and syncope.  Respiratory:  Negative for shortness of breath.   Hematologic/Lymphatic: Negative for bleeding problem.    EKGs/Labs/Other Studies Reviewed:    Echocardiogram: April 24 2024 LVEF: 60 to 65% Diastolic Function: Normal. Right ventricle: Size and function normal. Regurgitation: Mild MR Stenosis: No significant See report for additional details   Coronary CTA: 04/22/2024 1. Coronary calcium  score of  683. This was 99th percentile for age and sex matched control.   2. Normal coronary origins with co-dominance.   3. CAD-RADS 4 Severe coronary artery disease.   4. Severe stenosis (70-99%) at proximal LAD due to calcified plaque. Severe stenosis (70-99%) at proximal/mid LAD due to mixed but predominantly calcified plaque.   5. Severe stenosis (70-99%) due to mixed plaque at mid LCX   6. Severe stenosis (70-99%) due to mixed plaque from proximal to proximal/mid RCA.   7. CT FFR will be performed and reported separately due to concern for multivessel CAD.   8. Aortic atherosclerosis.   9. Patent foramen ovale.  10. CT FFR analysis showed significant multivessel CAD in LAD, RCA,and LCx.  11.  Radiology overread: Aortic Atherosclerosis (ICD10-I70.0).     Left heart catheterization 05/02/2024   Mid RCA to Dist RCA lesion is 70% stenosed.   Prox RCA to Mid RCA lesion is 80% stenosed.   Prox LAD to Mid LAD lesion is 90% stenosed.   Ost Cx to Prox Cx lesion is 50% stenosed.  Mid Cx lesion is 90% stenosed.   LV end diastolic pressure is normal.   Severe 3 vessel obstructive CAD Normal LVEDP   Disease is not favorable for PCI with multiple segmental lesions/calcification and tortuosity. Would recommend consideration for CABG  Carotid duplex Performed at [facility] physicians, results provided with the patient on her smart phone Left ICA proximal 70-99%. Right ICA proximal 50-69% Patent bilateral vertebral arteries  RADIOLOGY: CTA head and neck with and without contrast 05/21/2024 1. Age advanced aortic arch and cervical carotid atherosclerosis. Left ICA bulb focal stenosis (numerically estimated 60%), with <50% other carotid stenoses. 2. Note also Left side persistent trigeminal artery - arising from the Left ICA siphon and primarily supplies the intracranial posterior circulation (a rare anatomic variant). 3. Normal for age CT appearance of the brain. 4. Bulky ossification of  the posterior longitudinal ligament (OPLL) causes at least moderate and possibly seveere cervical spinal stenosis at C2-C3. Recommend spine surgery follow-up.  Labs:    Latest Ref Rng & Units 04/23/2024    1:21 PM 03/28/2023    9:37 AM 12/26/2022    3:03 PM  CBC  WBC 3.4 - 10.8 x10E3/uL 9.1  9.3  9.2   Hemoglobin 11.1 - 15.9 g/dL 87.6  87.2  87.0   Hematocrit 34.0 - 46.6 % 38.0  39.6  39.3   Platelets 150 - 450 x10E3/uL 318  285  305        Latest Ref Rng & Units 04/16/2024    8:09 AM 03/28/2023    9:37 AM 12/26/2022    3:03 PM  BMP  Glucose 70 - 99 mg/dL 867  867  96   BUN 6 - 24 mg/dL 21  16  18    Creatinine 0.57 - 1.00 mg/dL 8.75  8.94  8.96   BUN/Creat Ratio 9 - 23 17  15   SEE NOTE:   Sodium 134 - 144 mmol/L 136  138  138   Potassium 3.5 - 5.2 mmol/L 4.6  4.4  4.4   Chloride 96 - 106 mmol/L 98  102  100   CO2 20 - 29 mmol/L 24  26  29    Calcium  8.7 - 10.2 mg/dL 9.7  9.7  89.3       Latest Ref Rng & Units 04/16/2024    8:09 AM 03/28/2023    9:37 AM 12/26/2022    3:03 PM  CMP  Glucose 70 - 99 mg/dL 867  867  96   BUN 6 - 24 mg/dL 21  16  18    Creatinine 0.57 - 1.00 mg/dL 8.75  8.94  8.96   Sodium 134 - 144 mmol/L 136  138  138   Potassium 3.5 - 5.2 mmol/L 4.6  4.4  4.4   Chloride 96 - 106 mmol/L 98  102  100   CO2 20 - 29 mmol/L 24  26  29    Calcium  8.7 - 10.2 mg/dL 9.7  9.7  89.3   Total Protein 6.0 - 8.5 g/dL 7.4  7.4  7.5   Total Bilirubin 0.0 - 1.2 mg/dL 0.4  0.4  0.4   Alkaline Phos 49 - 135 IU/L 35     AST 0 - 40 IU/L 20  14  15    ALT 0 - 32 IU/L 20  17  18      Lab Results  Component Value Date   CHOL 153 04/16/2024   HDL 36 (L) 04/16/2024   LDLCALC 89 04/16/2024   TRIG 162 (H) 04/16/2024  CHOLHDL 4.3 04/16/2024   Recent Labs    04/16/24 0809  LIPOA <8.4   No components found for: NTPROBNP No results for input(s): PROBNP in the last 8760 hours. No results for input(s): TSH in the last 8760 hours.  External Labs: Collected: December 26, 2023 [facility] database. Total cholesterol 153, triglycerides 204, HDL 36, LDL 82, A1c 6.4%. Hemoglobin 12.8. Potassium 4.8. Magnesium 2.1  Physical Exam:    Today's Vitals   05/23/24 0805  BP: (!) 176/80  Pulse: 67  SpO2: 98%  Weight: 130 lb 12.8 oz (59.3 kg)  Height: 5' 1.5 (1.562 m)   Body mass index is 24.31 kg/m. Wt Readings from Last 3 Encounters:  05/23/24 130 lb 12.8 oz (59.3 kg)  05/22/24 135 lb (61.2 kg)  05/02/24 136 lb (61.7 kg)    Physical Exam  Constitutional: No distress.  hemodynamically stable  Neck: No JVD present.  Cardiovascular: Normal rate, regular rhythm, S1 normal and S2 normal. Exam reveals no gallop, no S3 and no S4.  No murmur heard. Pulses:      Carotid pulses are  on the right side with bruit and  on the left side with bruit. Pulmonary/Chest: Effort normal and breath sounds normal. No stridor. She has no wheezes. She has no rales.  Musculoskeletal:        General: No edema.     Cervical back: Neck supple.  Skin: Skin is warm.   Impression & Recommendation(s):  Impression:   ICD-10-CM   1. Atherosclerosis of native coronary artery of native heart with other form of angina pectoris  I25.118 isosorbide  mononitrate (IMDUR ) 60 MG 24 hr tablet    2. Carotid stenosis, asymptomatic, left  I65.22     3. Non-insulin  dependent type 2 diabetes mellitus (HCC)  E11.9     4. Pure hypertriglyceridemia  E78.1     5. Benign hypertension  I10 isosorbide  mononitrate (IMDUR ) 60 MG 24 hr tablet    6. Cervical spinal stenosis  M48.02        Recommendation(s):  Atherosclerosis of native coronary artery of native heart  Coronary CTA/angiography: Multivessel CAD. Echocardiogram December 2025: Preserved LVEF, normal diastolic function, no significant valvular heart disease CTA head and neck with and without contrast January 2026: Right ICA less than 50%, left ICA 60% stenosis No active chest pain. Anginal therapy: Carvedilol, Imdur , sublingual nitroglycerin   tablets as needed Continue Crestor  40 mg p.o. nightly Continue aspirin  81 mg p.o. daily Increase Imdur  to 60 mg p.o. daily Patient has been seen by cardiothoracic surgery and the plan is to undergo surgical revascularization in February 2026. Patient is advised not to overexert in the meantime and to seek medical attention by going to the closest ER via EMS if she has reoccurrence of chest pain.  Left carotid stenosis  Asymptomatic  Outside carotid duplex reported left ICA 70-99% and right ICA 50-69% CTA head and neck with and without contrast January 2026: Right ICA less than 50%, left ICA 60% stenosis Continue antiplatelets and lipid-lowering agents Will continue to monitor. Patient is educated on the potential symptom profile associated with carotid disease.  Non-insulin  dependent type 2 diabetes mellitus (HCC) Reemphasized importance of glycemic control.  Pure hypertriglyceridemia Continue Crestor  40 mg p.o. nightly  Will follow-up with fasting lipid profile at the next office visit and if triglycerides upon repeat check do not improve with increasing statins and lifestyle modifications that additional pharmacological therapy should be considered  Benign hypertension Office and home blood pressures are not  at goal. Increase Imdur  from 30 mg p.o. daily to 60 mg p.o. every morning  Reemphasized importance of low-salt diet. Patient is advised to continue to check her blood pressures at home. Recommend a goal SBP around 130 mmHg  Cervical spine stenosis Incidental finding on CTA head and neck with and without contrast Patient denies any radicular pain or difficulty with ambulation. Recommended that she discusses this finding with PCP and consider neurospine consultation for long-term management/follow-up.  Patient and husband verbalized understanding. Discussed this finding with Dr. Daniel who also agrees that this can be followed up after the tentatively scheduled CABG. However, patient  is advised to schedule the appt sooner to avoid long wait times.   Orders Placed:  No orders of the defined types were placed in this encounter.    Final Medication List:    Meds ordered this encounter  Medications   isosorbide  mononitrate (IMDUR ) 60 MG 24 hr tablet    Sig: Take 1 tablet (60 mg total) by mouth daily.    Dispense:  90 tablet    Refill:  3    Medications Discontinued During This Encounter  Medication Reason   isosorbide  mononitrate (IMDUR ) 30 MG 24 hr tablet        Current Outpatient Medications:    acetaminophen  (TYLENOL ) 500 MG tablet, Take 500 mg by mouth every 6 (six) hours as needed., Disp: , Rfl:    aspirin  EC 81 MG tablet, Take 1 tablet (81 mg total) by mouth daily. Swallow whole., Disp: , Rfl:    carvedilol (COREG) 3.125 MG tablet, Take 3.125 mg by mouth 2 (two) times daily., Disp: , Rfl:    Cholecalciferol 125 MCG (5000 UT) capsule, Take 5,000 Units by mouth daily., Disp: , Rfl:    fenofibrate  micronized (LOFIBRA) 134 MG capsule, TAKE 1 CAPSULE DAILY FOR TRIGLYCERIDES ( BLOOD FATS ), Disp: 90 capsule, Rfl: 3   hydrochlorothiazide  (HYDRODIURIL ) 25 MG tablet, TAKE 1 TABLET BY MOUTH EVERY MORNING FOR BLOOD PRESSURE AND FLUID RETENTION, Disp: 90 tablet, Rfl: 3   isosorbide  mononitrate (IMDUR ) 60 MG 24 hr tablet, Take 1 tablet (60 mg total) by mouth daily., Disp: 90 tablet, Rfl: 3   Lancets (ONETOUCH DELICA PLUS LANCET30G) MISC, USE TO TEST BLOOD SUGAR ONCE DAILY, Disp: 100 each, Rfl: 11   levothyroxine  (SYNTHROID ) 100 MCG tablet, 1 tablet in the morning on an empty stomach Orally Once a day; Duration: 90 days, Disp: , Rfl:    metFORMIN  (GLUCOPHAGE -XR) 500 MG 24 hr tablet, Take 1 tablet (500 mg total) by mouth 3 (three) times daily. 1 tab in the morning, 1 at lunch, and sometimes 1 in the evening, Disp: , Rfl:    nitroGLYCERIN  (NITROSTAT ) 0.4 MG SL tablet, Place 1 tablet (0.4 mg total) under the tongue every 5 (five) minutes as needed for chest pain. If you  require more than two tablets five minutes apart go to the nearest ER via EMS., Disp: 30 tablet, Rfl: 0   olmesartan  (BENICAR ) 40 MG tablet, TAKE 1 TABLET EVERY NIGHT FOR BP & DIABETIC KIDNEY PROTECTION, Disp: 90 tablet, Rfl: 3   ONETOUCH VERIO test strip, CHECK BLOOD SUGAR DAILY ( DX: E.11 ), Disp: 100 strip, Rfl: 3   rosuvastatin  (CRESTOR ) 40 MG tablet, Take 1 tablet (40 mg total) by mouth daily., Disp: 90 tablet, Rfl: 3   ALPRAZolam  (XANAX ) 0.5 MG tablet, 1 tablet Orally Twice a day As needed (Patient not taking: Reported on 05/22/2024), Disp: , Rfl:   Consent:  N/A     Disposition:   March 2026  Signed, Madonna Michele HAS, [facility]  A Division of [facility] [address] [place], [place] [address]  05/23/2024 9:05 AM  "

## 2024-05-23 NOTE — Patient Instructions (Addendum)
 Medication Instructions:  INCREASE Isosorbide  Mononitrate (Imdur ) to 60 mg. Take two (2) tablets by mouth once daily in the morning until your 30 mg tablets are depleted. THEN take one (1) 60 mg tablet by mouth once daily in the morning.  *If you need a refill on your cardiac medications before your next appointment, please call your pharmacy*  Lab Work: None ordered If you have labs (blood work) drawn today and your tests are completely normal, you will receive your results only by: MyChart Message (if you have MyChart) OR A paper copy in the mail If you have any lab test that is abnormal or we need to change your treatment, we will call you to review the results.  Testing/Procedures: None ordered  Follow-Up: At [facility], you and your health needs are our priority.  As part of our continuing mission to provide you with exceptional heart care, our providers are all part of one team.  This team includes your primary Cardiologist (physician) and Advanced Practice Providers or APPs (Physician Assistants and Nurse Practitioners) who all work together to provide you with the care you need, when you need it.  Your next appointment:   Last week in March  Provider:   Madonna Large, DO    We recommend signing up for the patient portal called MyChart.  Sign up information is provided on this After Visit Summary.  MyChart is used to connect with patients for Virtual Visits (Telemedicine).  Patients are able to view lab/test results, encounter notes, upcoming appointments, etc.  Non-urgent messages can be sent to your provider as well.   To learn more about what you can do with MyChart, go to [email or web address].

## 2024-05-26 ENCOUNTER — Other Ambulatory Visit: Payer: Self-pay | Admitting: *Deleted

## 2024-05-26 ENCOUNTER — Encounter: Payer: Self-pay | Admitting: Cardiology

## 2024-05-26 DIAGNOSIS — I2510 Atherosclerotic heart disease of native coronary artery without angina pectoris: Secondary | ICD-10-CM

## 2024-05-26 NOTE — Telephone Encounter (Signed)
 Continue Crestor  20mg  po at bedtime for now.  Will re-evaluate lipids after your CABG surgery and if needed make any changes.   Dr. Zionah Criswell

## 2024-05-27 ENCOUNTER — Encounter: Payer: Self-pay | Admitting: Cardiology

## 2024-07-02 ENCOUNTER — Other Ambulatory Visit (HOSPITAL_COMMUNITY)

## 2024-07-02 ENCOUNTER — Ambulatory Visit (HOSPITAL_COMMUNITY)

## 2024-07-04 ENCOUNTER — Inpatient Hospital Stay (HOSPITAL_COMMUNITY): Admit: 2024-07-04

## 2024-08-05 ENCOUNTER — Ambulatory Visit: Admitting: Cardiology

## 2024-09-01 ENCOUNTER — Ambulatory Visit (HOSPITAL_COMMUNITY)

## 2024-09-01 ENCOUNTER — Encounter: Admitting: Surgery

## 2024-09-04 ENCOUNTER — Ambulatory Visit (HOSPITAL_BASED_OUTPATIENT_CLINIC_OR_DEPARTMENT_OTHER): Payer: Self-pay
